# Patient Record
Sex: Female | Born: 1940 | Race: White | Hispanic: No | Marital: Single | Smoking: Former smoker
Health system: Southern US, Community
[De-identification: ages and names within clinical notes are randomized; demographics above are authoritative.]

## PROBLEM LIST (undated history)

## (undated) DIAGNOSIS — I10 Essential (primary) hypertension: Secondary | ICD-10-CM

## (undated) DIAGNOSIS — J439 Emphysema, unspecified: Secondary | ICD-10-CM

## (undated) DIAGNOSIS — I509 Heart failure, unspecified: Secondary | ICD-10-CM

## (undated) HISTORY — PX: HIP SURGERY: SHX245

## (undated) HISTORY — PX: TUMOR REMOVAL: SHX12

---

## 2004-09-12 ENCOUNTER — Ambulatory Visit: Payer: Self-pay | Admitting: Internal Medicine

## 2004-09-14 ENCOUNTER — Ambulatory Visit: Payer: Self-pay | Admitting: *Deleted

## 2004-09-26 ENCOUNTER — Ambulatory Visit (HOSPITAL_COMMUNITY): Admission: RE | Admit: 2004-09-26 | Discharge: 2004-09-27 | Payer: Self-pay | Admitting: *Deleted

## 2012-05-04 DIAGNOSIS — J449 Chronic obstructive pulmonary disease, unspecified: Secondary | ICD-10-CM | POA: Insufficient documentation

## 2012-05-04 DIAGNOSIS — I10 Essential (primary) hypertension: Secondary | ICD-10-CM | POA: Insufficient documentation

## 2012-07-10 ENCOUNTER — Inpatient Hospital Stay: Payer: Self-pay | Admitting: Internal Medicine

## 2012-07-10 LAB — COMPREHENSIVE METABOLIC PANEL
Albumin: 3.9 g/dL (ref 3.4–5.0)
Alkaline Phosphatase: 136 U/L (ref 50–136)
Anion Gap: 11 (ref 7–16)
BUN: 40 mg/dL — ABNORMAL HIGH (ref 7–18)
Bilirubin,Total: 0.4 mg/dL (ref 0.2–1.0)
Chloride: 95 mmol/L — ABNORMAL LOW (ref 98–107)
Co2: 24 mmol/L (ref 21–32)
Creatinine: 1.51 mg/dL — ABNORMAL HIGH (ref 0.60–1.30)
EGFR (Non-African Amer.): 34 — ABNORMAL LOW
Glucose: 91 mg/dL (ref 65–99)
Osmolality: 270 (ref 275–301)
SGOT(AST): 32 U/L (ref 15–37)
SGPT (ALT): 16 U/L (ref 12–78)
Sodium: 130 mmol/L — ABNORMAL LOW (ref 136–145)

## 2012-07-10 LAB — CBC WITH DIFFERENTIAL/PLATELET
Basophil #: 0 10*3/uL (ref 0.0–0.1)
Basophil %: 0.5 %
HCT: 42.2 % (ref 35.0–47.0)
HGB: 14.1 g/dL (ref 12.0–16.0)
Lymphocyte #: 0.7 10*3/uL — ABNORMAL LOW (ref 1.0–3.6)
MCHC: 33.4 g/dL (ref 32.0–36.0)
MCV: 93 fL (ref 80–100)
Monocyte %: 4.7 %
Neutrophil #: 8.7 10*3/uL — ABNORMAL HIGH (ref 1.4–6.5)
Neutrophil %: 88.1 %
RBC: 4.52 10*6/uL (ref 3.80–5.20)
RDW: 12.8 % (ref 11.5–14.5)
WBC: 9.9 10*3/uL (ref 3.6–11.0)

## 2012-07-10 LAB — CK TOTAL AND CKMB (NOT AT ARMC)
CK, Total: 126 U/L (ref 21–215)
CK-MB: 0.8 ng/mL (ref 0.5–3.6)

## 2012-07-10 LAB — TROPONIN I: Troponin-I: <0.02 ng/mL

## 2012-07-11 LAB — BASIC METABOLIC PANEL
Anion Gap: 10 (ref 7–16)
BUN: 52 mg/dL — ABNORMAL HIGH (ref 7–18)
Calcium, Total: 8.1 mg/dL — ABNORMAL LOW (ref 8.5–10.1)
Chloride: 99 mmol/L (ref 98–107)
Co2: 22 mmol/L (ref 21–32)
Creatinine: 1.85 mg/dL — ABNORMAL HIGH (ref 0.60–1.30)
EGFR (Non-African Amer.): 27 — ABNORMAL LOW
Glucose: 224 mg/dL — ABNORMAL HIGH (ref 65–99)
Osmolality: 284 (ref 275–301)
Potassium: 4.2 mmol/L (ref 3.5–5.1)

## 2012-07-11 LAB — URINALYSIS, COMPLETE
Bilirubin,UR: NEGATIVE
Blood: NEGATIVE
Glucose,UR: NEGATIVE mg/dL (ref 0–75)
Ketone: NEGATIVE
Protein: NEGATIVE
RBC,UR: <1 /HPF (ref 0–5)
Squamous Epithelial: 1
WBC UR: 4 /HPF (ref 0–5)

## 2012-07-11 LAB — CBC WITH DIFFERENTIAL/PLATELET
Eosinophil #: 0 10*3/uL (ref 0.0–0.7)
Eosinophil %: 0 %
MCH: 30.9 pg (ref 26.0–34.0)
Platelet: 231 10*3/uL (ref 150–440)
WBC: 6 10*3/uL (ref 3.6–11.0)

## 2012-07-11 LAB — PROTIME-INR: INR: 0.8

## 2012-07-11 LAB — TROPONIN I: Troponin-I: <0.02 ng/mL

## 2012-07-12 DIAGNOSIS — R0602 Shortness of breath: Secondary | ICD-10-CM

## 2012-07-12 LAB — CBC WITH DIFFERENTIAL/PLATELET
Basophil %: 0.1 %
Eosinophil #: 0 10*3/uL (ref 0.0–0.7)
HCT: 35.5 % (ref 35.0–47.0)
MCH: 30.3 pg (ref 26.0–34.0)
MCHC: 32 g/dL (ref 32.0–36.0)
Monocyte #: 0.3 x10 3/mm (ref 0.2–0.9)
Monocyte %: 2.3 %
Neutrophil #: 11.4 10*3/uL — ABNORMAL HIGH (ref 1.4–6.5)
Neutrophil %: 93.6 %
RBC: 3.74 10*6/uL — ABNORMAL LOW (ref 3.80–5.20)
RDW: 12.9 % (ref 11.5–14.5)

## 2012-07-12 LAB — BASIC METABOLIC PANEL
BUN: 50 mg/dL — ABNORMAL HIGH (ref 7–18)
Calcium, Total: 8 mg/dL — ABNORMAL LOW (ref 8.5–10.1)
Creatinine: 1.26 mg/dL (ref 0.60–1.30)
Glucose: 144 mg/dL — ABNORMAL HIGH (ref 65–99)
Osmolality: 282 (ref 275–301)
Potassium: 4.4 mmol/L (ref 3.5–5.1)
Sodium: 133 mmol/L — ABNORMAL LOW (ref 136–145)

## 2012-07-13 LAB — BASIC METABOLIC PANEL
Anion Gap: 9 (ref 7–16)
BUN: 40 mg/dL — ABNORMAL HIGH (ref 7–18)
Calcium, Total: 8.2 mg/dL — ABNORMAL LOW (ref 8.5–10.1)
Chloride: 107 mmol/L (ref 98–107)
Co2: 20 mmol/L — ABNORMAL LOW (ref 21–32)
Creatinine: 1.02 mg/dL (ref 0.60–1.30)
EGFR (African American): >60
EGFR (Non-African Amer.): 55 — ABNORMAL LOW
Osmolality: 284 (ref 275–301)
Potassium: 4.5 mmol/L (ref 3.5–5.1)
Sodium: 136 mmol/L (ref 136–145)

## 2012-08-01 ENCOUNTER — Inpatient Hospital Stay: Payer: Self-pay | Admitting: Specialist

## 2012-08-01 LAB — COMPREHENSIVE METABOLIC PANEL
Albumin: 3.9 g/dL (ref 3.4–5.0)
Alkaline Phosphatase: 133 U/L (ref 50–136)
Anion Gap: 9 (ref 7–16)
BUN: 34 mg/dL — ABNORMAL HIGH (ref 7–18)
Bilirubin,Total: 0.3 mg/dL (ref 0.2–1.0)
Calcium, Total: 8.8 mg/dL (ref 8.5–10.1)
Co2: 27 mmol/L (ref 21–32)
Creatinine: 1.26 mg/dL (ref 0.60–1.30)
Glucose: 97 mg/dL (ref 65–99)
Osmolality: 257 (ref 275–301)
SGOT(AST): 24 U/L (ref 15–37)
SGPT (ALT): 15 U/L (ref 12–78)
Sodium: 124 mmol/L — ABNORMAL LOW (ref 136–145)
Total Protein: 8.1 g/dL (ref 6.4–8.2)

## 2012-08-01 LAB — CBC
MCH: 31.4 pg (ref 26.0–34.0)
MCHC: 34.1 g/dL (ref 32.0–36.0)
MCV: 92 fL (ref 80–100)
RBC: 3.87 10*6/uL (ref 3.80–5.20)
WBC: 5.9 10*3/uL (ref 3.6–11.0)

## 2012-08-01 LAB — BASIC METABOLIC PANEL
BUN: 30 mg/dL — ABNORMAL HIGH (ref 7–18)
Chloride: 93 mmol/L — ABNORMAL LOW (ref 98–107)
Co2: 26 mmol/L (ref 21–32)
Creatinine: 1.13 mg/dL (ref 0.60–1.30)
EGFR (African American): 56 — ABNORMAL LOW
EGFR (Non-African Amer.): 49 — ABNORMAL LOW
Glucose: 101 mg/dL — ABNORMAL HIGH (ref 65–99)
Osmolality: 260 (ref 275–301)
Potassium: 3.9 mmol/L (ref 3.5–5.1)
Sodium: 126 mmol/L — ABNORMAL LOW (ref 136–145)

## 2012-08-01 LAB — URINALYSIS, COMPLETE
Bilirubin,UR: NEGATIVE
Glucose,UR: NEGATIVE mg/dL (ref 0–75)
Ph: 6 (ref 4.5–8.0)
Specific Gravity: 1.006 (ref 1.003–1.030)
Squamous Epithelial: <1

## 2012-08-01 LAB — TROPONIN I: Troponin-I: <0.02 ng/mL

## 2012-08-02 LAB — CBC WITH DIFFERENTIAL/PLATELET
Basophil #: 0 10*3/uL (ref 0.0–0.1)
Basophil %: 0.4 %
Eosinophil #: 0 10*3/uL (ref 0.0–0.7)
Lymphocyte #: 0.7 10*3/uL — ABNORMAL LOW (ref 1.0–3.6)
Lymphocyte %: 14.5 %
MCH: 30.5 pg (ref 26.0–34.0)
MCHC: 33.2 g/dL (ref 32.0–36.0)
MCV: 92 fL (ref 80–100)
Monocyte #: 0.4 x10 3/mm (ref 0.2–0.9)
Neutrophil %: 76.3 %
Platelet: 329 10*3/uL (ref 150–440)
RBC: 3.71 10*6/uL — ABNORMAL LOW (ref 3.80–5.20)
WBC: 4.7 10*3/uL (ref 3.6–11.0)

## 2012-08-02 LAB — BASIC METABOLIC PANEL
Chloride: 96 mmol/L — ABNORMAL LOW (ref 98–107)
Co2: 26 mmol/L (ref 21–32)
Creatinine: 0.98 mg/dL (ref 0.60–1.30)
EGFR (African American): >60
EGFR (Non-African Amer.): 58 — ABNORMAL LOW
Osmolality: 266 (ref 275–301)
Potassium: 3.9 mmol/L (ref 3.5–5.1)
Sodium: 130 mmol/L — ABNORMAL LOW (ref 136–145)

## 2012-08-04 LAB — CBC WITH DIFFERENTIAL/PLATELET
Basophil #: 0 10*3/uL (ref 0.0–0.1)
Basophil %: 0.1 %
Eosinophil #: 0 10*3/uL (ref 0.0–0.7)
Eosinophil %: 0 %
HGB: 10.1 g/dL — ABNORMAL LOW (ref 12.0–16.0)
Lymphocyte #: 0.3 10*3/uL — ABNORMAL LOW (ref 1.0–3.6)
Lymphocyte %: 6.7 %
MCH: 30.9 pg (ref 26.0–34.0)
MCV: 93 fL (ref 80–100)
Monocyte %: 0.7 %
Neutrophil #: 4.1 10*3/uL (ref 1.4–6.5)
Neutrophil %: 92.5 %
Platelet: 314 10*3/uL (ref 150–440)
RBC: 3.29 10*6/uL — ABNORMAL LOW (ref 3.80–5.20)

## 2012-08-04 LAB — BASIC METABOLIC PANEL
Calcium, Total: 8.5 mg/dL (ref 8.5–10.1)
Co2: 26 mmol/L (ref 21–32)
Creatinine: 0.84 mg/dL (ref 0.60–1.30)
EGFR (African American): >60
EGFR (Non-African Amer.): >60
Glucose: 162 mg/dL — ABNORMAL HIGH (ref 65–99)
Potassium: 5 mmol/L (ref 3.5–5.1)
Sodium: 132 mmol/L — ABNORMAL LOW (ref 136–145)

## 2013-10-19 ENCOUNTER — Emergency Department: Payer: Self-pay | Admitting: Emergency Medicine

## 2013-10-28 ENCOUNTER — Encounter: Payer: Self-pay | Admitting: Surgery

## 2013-11-02 ENCOUNTER — Inpatient Hospital Stay: Payer: Self-pay | Admitting: Internal Medicine

## 2013-11-02 LAB — BASIC METABOLIC PANEL
Anion Gap: 12 (ref 7–16)
BUN: 24 mg/dL — ABNORMAL HIGH (ref 7–18)
CREATININE: 1 mg/dL (ref 0.60–1.30)
Calcium, Total: 9.5 mg/dL (ref 8.5–10.1)
Chloride: 82 mmol/L — ABNORMAL LOW (ref 98–107)
Co2: 24 mmol/L (ref 21–32)
EGFR (Non-African Amer.): 56 — ABNORMAL LOW
Glucose: 94 mg/dL (ref 65–99)
Osmolality: 242 (ref 275–301)
Potassium: 4.2 mmol/L (ref 3.5–5.1)
SODIUM: 118 mmol/L — AB (ref 136–145)

## 2013-11-02 LAB — URINALYSIS, COMPLETE
BACTERIA: NONE SEEN
BILIRUBIN, UR: NEGATIVE
BLOOD: NEGATIVE
GLUCOSE, UR: NEGATIVE mg/dL (ref 0–75)
LEUKOCYTE ESTERASE: NEGATIVE
Nitrite: NEGATIVE
PH: 6 (ref 4.5–8.0)
Protein: NEGATIVE
SPECIFIC GRAVITY: 1.005 (ref 1.003–1.030)

## 2013-11-02 LAB — CBC WITH DIFFERENTIAL/PLATELET
BASOS ABS: 0 10*3/uL (ref 0.0–0.1)
Basophil %: 0.4 %
EOS ABS: 0 10*3/uL (ref 0.0–0.7)
EOS PCT: 0.1 %
HCT: 37.8 % (ref 35.0–47.0)
HGB: 12.5 g/dL (ref 12.0–16.0)
Lymphocyte #: 0.7 10*3/uL — ABNORMAL LOW (ref 1.0–3.6)
Lymphocyte %: 9.8 %
MCH: 29.8 pg (ref 26.0–34.0)
MCHC: 32.9 g/dL (ref 32.0–36.0)
MCV: 91 fL (ref 80–100)
MONO ABS: 0.3 x10 3/mm (ref 0.2–0.9)
Monocyte %: 4.9 %
NEUTROS PCT: 84.8 %
Neutrophil #: 5.9 10*3/uL (ref 1.4–6.5)
PLATELETS: 455 10*3/uL — AB (ref 150–440)
RBC: 4.18 10*6/uL (ref 3.80–5.20)
RDW: 12.8 % (ref 11.5–14.5)
WBC: 7 10*3/uL (ref 3.6–11.0)

## 2013-11-02 LAB — TROPONIN I: Troponin-I: <0.02 ng/mL

## 2013-11-02 LAB — TSH: THYROID STIMULATING HORM: 1.29 u[IU]/mL

## 2013-11-03 LAB — BASIC METABOLIC PANEL
Anion Gap: 6 — ABNORMAL LOW (ref 7–16)
BUN: 24 mg/dL — ABNORMAL HIGH (ref 7–18)
CHLORIDE: 85 mmol/L — AB (ref 98–107)
CO2: 31 mmol/L (ref 21–32)
Calcium, Total: 9 mg/dL (ref 8.5–10.1)
Creatinine: 0.98 mg/dL (ref 0.60–1.30)
EGFR (African American): >60
EGFR (Non-African Amer.): 57 — ABNORMAL LOW
Glucose: 92 mg/dL (ref 65–99)
OSMOLALITY: 250 (ref 275–301)
Potassium: 3.7 mmol/L (ref 3.5–5.1)
Sodium: 122 mmol/L — ABNORMAL LOW (ref 136–145)

## 2013-11-03 LAB — OSMOLALITY, URINE: Osmolality: 210 mOsm/kg

## 2013-11-03 LAB — SODIUM, URINE, RANDOM: Sodium, Urine Random: 37 mmol/L (ref 20–110)

## 2013-11-04 LAB — BASIC METABOLIC PANEL
Anion Gap: 5 — ABNORMAL LOW (ref 7–16)
BUN: 17 mg/dL (ref 7–18)
Calcium, Total: 9.2 mg/dL (ref 8.5–10.1)
Chloride: 97 mmol/L — ABNORMAL LOW (ref 98–107)
Co2: 29 mmol/L (ref 21–32)
Creatinine: 0.82 mg/dL (ref 0.60–1.30)
EGFR (African American): >60
EGFR (Non-African Amer.): >60
Glucose: 109 mg/dL — ABNORMAL HIGH (ref 65–99)
Osmolality: 265 (ref 275–301)
POTASSIUM: 4.2 mmol/L (ref 3.5–5.1)
Sodium: 131 mmol/L — ABNORMAL LOW (ref 136–145)

## 2013-11-04 LAB — CBC WITH DIFFERENTIAL/PLATELET
BASOS ABS: 0 10*3/uL (ref 0.0–0.1)
Basophil %: 0.6 %
Eosinophil #: 0.1 10*3/uL (ref 0.0–0.7)
Eosinophil %: 1.2 %
HCT: 33.1 % — AB (ref 35.0–47.0)
HGB: 10.8 g/dL — AB (ref 12.0–16.0)
Lymphocyte #: 0.9 10*3/uL — ABNORMAL LOW (ref 1.0–3.6)
Lymphocyte %: 21.1 %
MCH: 30.1 pg (ref 26.0–34.0)
MCHC: 32.6 g/dL (ref 32.0–36.0)
MCV: 93 fL (ref 80–100)
Monocyte #: 0.6 x10 3/mm (ref 0.2–0.9)
Monocyte %: 14.2 %
NEUTROS ABS: 2.7 10*3/uL (ref 1.4–6.5)
Neutrophil %: 62.9 %
Platelet: 393 10*3/uL (ref 150–440)
RBC: 3.58 10*6/uL — ABNORMAL LOW (ref 3.80–5.20)
RDW: 13.1 % (ref 11.5–14.5)
WBC: 4.3 10*3/uL (ref 3.6–11.0)

## 2013-11-05 LAB — BASIC METABOLIC PANEL
Anion Gap: 3 — ABNORMAL LOW (ref 7–16)
BUN: 11 mg/dL (ref 7–18)
CALCIUM: 9.2 mg/dL (ref 8.5–10.1)
Chloride: 103 mmol/L (ref 98–107)
Co2: 28 mmol/L (ref 21–32)
Creatinine: 0.79 mg/dL (ref 0.60–1.30)
EGFR (African American): >60
EGFR (Non-African Amer.): >60
GLUCOSE: 108 mg/dL — AB (ref 65–99)
Osmolality: 268 (ref 275–301)
Potassium: 4.7 mmol/L (ref 3.5–5.1)
SODIUM: 134 mmol/L — AB (ref 136–145)

## 2013-11-24 ENCOUNTER — Encounter: Payer: Self-pay | Admitting: Surgery

## 2014-02-03 DIAGNOSIS — M1610 Unilateral primary osteoarthritis, unspecified hip: Secondary | ICD-10-CM | POA: Insufficient documentation

## 2014-09-16 NOTE — H&P (Signed)
Past Med/Surgical Hx:  HTN:   COPD:   ALLERGIES:  Prednisone: Unknown  Codeine: Unknown  Penicillin: Unknown   Assessment/Admission Diagnosis 74 yo female with PMHx of HTN, COPD, Recent admission for PNA, now with possible intersitial PNA vs HCAP  1. Interstitial PNA vs HCAP - will treat as an HCAP with vacn and zosyn - her history of recurrent abx and steroids for the past 3 months with relapse of sob, dizziness (probably from hypoxia), and sputum production fits more of an interstitial or organizing pneumonia - would recommend pulmonary consult for possible bronchoscopy evaluation - may need to consider high dose steriods based on pulmonary recommendations - check sputum\respiratory culture - supplemental O2 to maintain sats 88-92%  2. COPD  - continue with advair, spiriva and prn albuterol - currently not in exacerbation - check ABG - check 6MWT when close to discharge   3.Hyponatremia - secondary to poor po appeptite - continue with IVFs  4. Hx of HTN - currently normotensive - patient with noted wide pulse, could be due to atherosclerosis or thyroid problems, will check TSH and T4  5. Lung Nodule - 7.11mm - seen on CT, LLL - can be followed up as an outpatient with PET CT in 4-6 wks after discharge.   FULL CODE PMD - Dr.Jack Wolfe  Time spent evaluating patient = 55 minutes Job# [id number]   Electronic Signatures: Vilinda Boehringer (MD)  (Signed 08-Mar-14 23:17)  Authored: PAST MEDICAL/SURGIAL HISTORY, ALLERGIES, ASSESSMENT AND PLAN   Last Updated: 08-Mar-14 23:17 by Vilinda Boehringer (MD)

## 2014-09-16 NOTE — H&P (Signed)
PATIENT NAME:  Jacqueline Boyd, Jacqueline Boyd MR#:  [id number] DATE OF BIRTH:  [birth year]  DATE OF ADMISSION:  07/10/2012  PRIMARY CARE PHYSICIAN: Dory Horn. Eliberto Ivory, MD  REQUESTING PHYSICIAN: Shirley Friar. Braud, MD  CHIEF COMPLAINT: Shortness of breath.   HISTORY OF PRESENT ILLNESS: The patient is a 74 year old female with a known history of COPD and hypertension, is being admitted for acute on chronic respiratory failure likely due to COPD exacerbation and/or pneumonia. The patient started having chest congestion and coughing since Tuesday. Had very poor p.o. intake for the last 2 to 3 days. Has been feeling really sick. Denies any chest pain. Her cough is dry in nature. She was feeling very dizzy and decided to come to the Emergency Department as she was not getting any better. While in the ED, she was found to have acute renal failure and possible COPD flare. She was quite hypoxic with room air oxygen saturation of 61%. She was placed on BiPAP and is being admitted for further evaluation and management.   PAST MEDICAL HISTORY:  1.  COPD.  2.  Hypertension.   MEDICATIONS AT HOME:   1.  ProAir 2 puffs inhaled 4 times a day.  2.  Lisinopril 20 mg p.o. daily.  3.  Lasix 40 mg p.o. b.i.d.  4.  Atenolol 50 mg p.o. daily.  5.  Amlodipine 10 mg p.o. daily.   ALLERGIES: CODEINE, PENICILLIN, PREDNISONE.   SOCIAL HISTORY: No smoking. Occasional alcohol, mainly beer. She lives alone.   FAMILY HISTORY: Mother had hypertension.   REVIEW OF SYSTEMS:   CONSTITUTIONAL: No fever. Positive fatigue and weakness.  EYES: No blurred or double vision.  ENT: No tinnitus or ear pain.  RESPIRATORY: Positive for dry cough, wheezing. Also shortness of breath along with chest congestion.  CARDIOVASCULAR: No chest pain, orthopnea, edema.  GASTROINTESTINAL: No nausea, vomiting, diarrhea. Poor p.o. intake.  GENITOURINARY: No dysuria or hematuria. ENDOCRINE: No polyuria or nocturia. HEMATOLOGIC: No anemia or easy bruising.   SKIN: No rash or lesion.  MUSCULOSKELETAL: No arthritis or muscle cramps.  NEUROLOGIC: No tingling, numbness, weakness. Positive for dizziness. PSYCHIATRIC: No history of anxiety or depression.   PHYSICAL EXAMINATION:  VITAL SIGNS: Temperature 98.1, heart rate 82 per minute, respirations 22 per minute, blood pressure 75/44 mmHg. She was saturating 61% on room air and now she is placed on BiPAP and saturating about 96% to 98% on that.  GENERAL: The patient is a 74 year old female lying in the bed in acute respiratory distress.  EYES: Pupils equal, round, reactive to light and accommodation. No scleral icterus. Extraocular muscles intact.  HENT: Head atraumatic, normocephalic. Oropharynx and nasopharynx clear.  NECK: Supple. No jugular venous distention. No thyroid enlargement or tenderness.  LUNGS: Decreased breath sounds present bilaterally, wheezing throughout both lungs. Rhonchi present in the right lower lobe. No rales or crepitation. Accessory muscles of respiration in use. BiPAP applied.  CARDIOVASCULAR: S1, S2 normal. No murmur, rubs or gallop.  ABDOMEN: Soft, nontender, nondistended. Bowel sounds present. No organomegaly or mass. EXTREMITIES: No pedal edema, cyanosis or clubbing. She does have chronic depigmentation changes in both lower extremities.  NEUROLOGIC: Nonfocal examination. Cranial nerves II through XII intact. Muscle strength 5/5 in all extremities. Sensation intact. PSYCHIATRIC: The patient is alert, oriented to time, place and person x 3.  SKIN: No obvious rash, lesion or ulcer.  LABORATORY, DIAGNOSTIC AND RADIOLOGICAL DATA: Normal CBC. Normal first set of cardiac enzymes. Normal liver function tests. BMP showed sodium 130, potassium 4.2, chloride  95, CO2 of 24, BUN 40, creatinine 1.51, blood glucose 91. ABG showed pH of 7.28, pCO2 of 44, pO2 of 77, bicarbonate 21.7.   Chest x-ray while in the ED showed possible right lower lobe infiltrate with small right pleural effusion  and COPD changes.   EKG shows sinus rhythm with frequent PVCs, left axis deviation and left bundle branch block. No old EKG available for comparison.   IMPRESSION AND PLAN:  1.  Acute hypoxic respiratory failure, likely due to chronic obstructive pulmonary disease exacerbation with possible right lower lobe pneumonia. Will start her on IV antibiotics, steroids, nebulizer treatment, Flovent and Spiriva. She is on BiPAP now and will monitor closely in the Critical Care Unit stepdown unit at this time, as her breathing seems bad. 2.  Chronic obstructive pulmonary disease exacerbation: Management as above. Will consider pulmonary consult if not improved.  3.  Pneumonia: IV Levaquin will be started. Obtain blood and sputum culture.  4.  Acute renal failure, likely prerenal in nature: Will hold angiotensin-converting enzyme inhibitor along with Lasix. Will hydrate her aggressively with IV fluids and recheck her renal function. This is likely prerenal in nature.  5.  Hypernatremia, likely due to dehydration with poor oral intake: Will hydrate her aggressively with IV fluids.  6.  Hypotension with a history of hypertension, likely due to dehydration: Will hold her blood pressure medication and hydrate her with aggressive IV fluids and monitor her blood pressure closely while in the Critical Care Unit stepdown.   CODE STATUS: Full code.   TOTAL TIME TAKING CARE OF THIS PATIENT (CRITICAL CARE): 55 minutes.  ____________________________ Lucina Mellow. Manuella Ghazi, MD vss:jm D: 07/10/2012 17:29:37 ET T: 07/10/2012 22:47:31 ET JOB#: [id number]  cc: Khalon Cansler S. Manuella Ghazi, MD, <Dictator> Dory Horn. Eliberto Ivory, [place] MD ELECTRONICALLY SIGNED 07/13/2012 19:28

## 2014-09-16 NOTE — Discharge Summary (Signed)
PATIENT NAME:  Jacqueline Boyd, Jacqueline Boyd MR#:  [id number] DATE OF BIRTH:  06/05/1940  DATE OF ADMISSION:  08/01/2012 DATE OF DISCHARGE:  08/06/2012  HISTORY AND PHYSICAL: For a detailed note, please take a look at the history and physical done on admission by Dr. Stevenson Clinch.   DIAGNOSES AT DISCHARGE: 1. Acute on chronic respiratory failure secondary to chronic obstructive pulmonary disease exacerbation.  2. Chronic obstructive pulmonary disease exacerbation secondary to multifocal pneumonia.  3. Multifocal pneumonia.  4. Hypertension.  5. Anxiety.   DIET: The patient is being discharged on a low-sodium, diet.   ACTIVITY: As tolerated.   FOLLOWUP: With Dr. Calla Kicks in the next 1 to 2 weeks, also with Dr. Devona Konig from pulmonary in the next 1 to 2 weeks.  DISCHARGE MEDICATIONS: Albuterol inhaler 2 puffs q.i.d. as needed, Xanax 0.5 mg q. 8 hours as needed, Spiriva 1 puff daily, fluticasone 1 puff b.i.d., multivitamin daily, Advair 250/50 1 puff b.i.d., amlodipine 10 mg daily, lisinopril 20 mg daily, Lasix 40 mg b.i.d., Levaquin 750 mg q. 48 hours x 7 doses and dexamethasone taper starting at 10 mg b.i.d. down to 2 mg b.i.d. over the next 10 days.   CONSULTANTS DURING HOSPITAL COURSE: Dr. Mortimer Fries from Pulmonary Critical Care.   PERTINENT HOSPITAL DIAGNOSTIC AND RADIOLOGICAL DATA:    A CT scan of the chest done without contrast on admission showing right middle lobe and right lower lobe infiltrate versus atelectasis. The appearance is most suggestive of infiltrate in the right lower lobe. Multifocal pneumonia is not excluded.   HOSPITAL COURSE: This is a 74 year old female with medical problems as mentioned above, presented to the hospital on 08/01/2012 secondary to shortness of breath and likely acute on chronic respiratory failure secondary to COPD exacerbation.   1. Acute on chronic respiratory failure: This was likely secondary to COPD exacerbation. The patient was treated aggressively with IV  steroids, around-the-clock nebulizer treatments, continued on her Advair and her Spiriva. Her clinical symptoms since admission have significantly improved. She was also placed on broad-spectrum IV antibiotics with vancomycin and Zosyn, although her blood cultures have remained negative. She was ambulated, did not qualify for home oxygen, and is currently being discharged on a Decadron taper along with empiric Levaquin, as stated.  2. Chronic obstructive pulmonary disease exacerbation: This was likely secondary to multifocal pneumonia. Again, the patient was treated aggressively with IV steroids, around-the-clock nebulizer treatments, continued on Advair, Spiriva. She was ambulated on room air, did not qualify for home oxygen, therefore, currently is being discharged on her maintenance meds along with a Decadron taper and Levaquin, as stated.  3. 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 It has improved with IV fluids and now resolved.  4. Hypertension: Initially, the patient was hypotensive; therefore, her antihypertensives were held, although she will resume her amlodipine and her lisinopril upon discharge.  5. Anxiety: The patient was maintained on her Xanax.  She will also resume that upon discharge.   CODE STATUS: The patient is a FULL CODE.   TIME SPENT: 40 minutes.    ____________________________ Belia Heman. Verdell Carmine, MD vjs:cb D: 08/06/2012 15:28:10 ET T: 08/06/2012 16:08:08 ET JOB#: [id number]  cc: Belia Heman. Verdell Carmine, MD, <Dictator> Dory Horn. Eliberto Ivory, MD Allyne Gee, MD Henreitta Leber MD ELECTRONICALLY SIGNED 08/10/2012 8:14

## 2014-09-16 NOTE — H&P (Signed)
PATIENT NAME:  Jacqueline Boyd, Jacqueline Boyd MR#:  [id number] DATE OF BIRTH:  06-Oct-1940  DATE OF ADMISSION:  08/01/2012  PRIMARY CARE PHYSICIAN:  Dr. Calla Kicks   EMERGENCY DEPARTMENT REFERRING PHYSICIAN:  Dr. Jimmye Norman   CHIEF COMPLAINT:  "I feel sick."   HISTORY OF PRESENT ILLNESS:  This is a 74 year old Caucasian female with past medical history of COPD, hypertension, recent admission for pneumonia that is being seen for continued right middle lobe, lower lobe infiltrate on CAT scan with continued dizziness and questionable hypoxia with exertion.  The history is per the patient.  She stated that she was here, admitted to [facility] about 2-1/2 weeks ago.  She got treated with steroids, antibiotics.  She was placed on BiPAP briefly for the COPD exacerbation and pneumonia while she was inpatient. Per records, she was discharged on dexamethasone taper and Levaquin that she was supposed to complete on the 4 days of, which she did.  The patient stated since discharge she really has not felt that much better.  She can walk about 15 feet before she starts feeling dizzy.  No real chest tightness or shortness of breath associated with it. Still has a cough, but not bring up much sputum.  She has had decreased by mouth appetite, not really eating.  She has been feeling more lethargic, tired and fatigued in addition to this dizziness with walking that she is getting.  The patient states she does not feel like the room is spinning or she is losing balance from under her feet.  This is something new for her and she cannot explain what it is.  In the ED she was noted to have hyponatremia.  She had some mild desats requiring 2 liters of oxygen.  She is sating down to about 87% to 88%, and given that she is still not having any clinical improvement since discharge with her persistent infiltrate on chest x-ray and CT scan, she is being evaluated by hospitalist services for further inpatient work-up and  management.    CURRENT MEDICATIONS:  Are as follows:  ProAir 2 puffs inhaled 4 times a day as needed, alprazolam 0.5 mg 1 tab every 8 hours for anxiety, ipratropium 18 mcg inhaled capsule once a day, Levaquin 250 mg 1 tab every 24 hours for 4 days, fluticasone inhalation aerosol 1 puff twice daily, multivitamin 1 tab orally once a day, Advair Diskus 1 puff twice daily, dexamethasone tablets taper which she completed.  She did not require any oxygen on discharge.   ALLERGIES:  CODEINE, PENICILLIN AND PREDNISONE.  SHE STATES THAT WITH PENICILLIN SHE GETS A MILD RASH.  NO HIVES.  WITH PREDNISONE PATIENT STATES THAT SHE STARTS HAVING SHIVERING OR SHAKING OR INCREASED ANXIETY.   SOCIAL HISTORY:  Currently not a smoker; previously smoked for about 25 to 30 years, a pack a day.  Quit smoking about 15 to 20 years ago.   FAMILY HISTORY:  Mother has hypertension.  She lives alone.   REVIEW OF SYSTEMS: CONSTITUTIONAL:  No fever.  Positive fatigue and weakness and dizziness.  EYES:  No blurred vision, double vision.  EARS, NOSE, THROAT:  No tinnitus or ear pain or hearing loss.  RESPIRATORY:  Positive for dry cough, intermittent wheezes.  Also some subjective shortness of breath with exertion along with some chest congestion.  CARDIOVASCULAR:  No chest pain, no chest tightness, no chest pressure.  No orthopnea.  No edema.  GASTROINTESTINAL:  No nausea, vomiting, diarrhea.  Admits to poor  by mouth intake over the last 2 to 3 weeks.  GENITOURINARY:  No dysuria, hematuria.  ENDOCRINE:  No polyuria, nocturia.  HEMATOLOGIC:  No anemia, easy bleeding, bruising or swollen glands.  SKIN:  No rash, lesions or erythema.  Skin is warm and dry.  MUSCULOSKELETAL:  No arthritis, muscle cramps.  NEUROLOGICAL:  No tingling, numbness or weakness.  Positive for dizziness.  PSYCHIATRIC:  No schizophrenia, depression, OCD, bipolar or ADHD.  Admits to anxiety.   PHYSICAL EXAMINATION:  VITAL SIGNS:  In the ED are as  follows:  Temperature 97.5, pulse of 71, respirations are 18, saturating 95% on 2 liters, blood pressure 131/56.  HEENT:  Pupils are equal, round and reactive to light and accommodation.  Extraocular movements are intact.  No scleral icterus.  Denies difficulty hearing.  Dry mucosa is noted.  NECK:  No thyroid enlargement.  No nodules.  Neck is supple and nontender with no masses.  No adenopathy is noted.  No JVD or carotid bruits.  Full range of motion is noted.  RESPIRATORY:  Coarse upper airway sounds. Decreased breath sounds bilaterally. Some faint expiratory wheezes are noted at the bilateral bases.  Otherwise, no labored breathing, no increased effort, no respiratory distress.  CARDIOVASCULAR:  Regular rate, regular rhythm.  No murmurs.  S1, S2 auscultated.  PMI is not lateralized and there is no lower extremity edema.  ABDOMEN:  Soft, nontender, nondistended.  Positive bowel sounds.  MUSCULOSKELETAL: 5 out of 5 strength bilateral upper and lower extremities.  No cyanosis, DJD or kyphosis.  SKIN:  No rash, lesions, erythema, nodules, induration.  Skin is warm and dry.  LYMPHATIC:  No adenopathy noted in the cervical, axilla or supraclavicular regions.  NEUROLOGIC:  Cranial nerves II through XII intact.  Deep tendon reflexes intact.  Sensory is intact.  PSYCHIATRIC:  Alert and oriented to time and place, cooperative and good judgment is noted.   LABORATORY AND DIAGNOSTIC DATA:  She had an EKG that showed normal sinus rhythm, rate of about 70.  No acute ST-T wave changes.  She had a CT scan of the chest without contrast done that showed no acute CT abnormality of the chest, abdomen and pelvis.  Findings suggestive of an old right rib fracture, recorded clinically. There is a rounded noncalcified nodular superior segment of the left lower lobe which must be followed to document stability.  If the patient is at high risk by prominent smoking history, then further assessment for developing a malignancy  should be performed with a PET CT.  There is also a smoothing marginated soft tissue nodular density in the left lower lobe with a diameter of approximately 7.3 mm as previously stated above.  There is dependent atelectasis.  Also, there is some minimal density in the lingula and right middle lobe which may reflect a fibrotic area.  This is most prominent in the lingula with a width of 1.7 cm.  Fibrosis could give a similar appearance of atelectasis; otherwise, no other acute chest findings are noted.  Pertinent labs are as follows.  Her sodium is 126, potassium is 3.9, chloride is 93, bicarb was 26, BUN is 30, creatinine is 1.13, glucose was 101.  UA does not show any acute signs of infection.  She did have a CBC that showed a WBC of 5.9, H and H of 12.2 and 37.5 and a platelet of 380.   ASSESSMENT AND PLAN:  A 74 year old female with a past medical history of hypertension, chronic obstructive pulmonary  disease, recent admission for pneumonia, now with possible interstitial pneumonia versus healthcare-associated pneumonia. 1.  Interstitial pneumonia versus healthcare-associated pneumonia.  At this time, we will treat as a healthcare-associated pneumonia with vancomycin and Zosyn and a history of recurrent antibiotic, steroid use over the past 3 months with relapse of shortness of breath and dizziness, intermittent at times.  The dizziness could reflect hypoxia.  She does have intermittent sputum production and the combination of these three things kind of more likely fits an organizing pneumonia interstitial type of process.  Would recommend pulmonary consult and possible bronchoscopy evaluation.  May need to consider high dose steroids based on pulmonary recommendations.  Check sputum and respiratory culture.  Continue with supplemental O2 to maintain saturations 88% to 92% at rest and with exertion.  2.  Chronic obstructive pulmonary disease.  Continue with Advair, Spiriva and as needed albuterol.  Currently  not in exacerbation.  Check an ABG.  We will check a 6 minute walk test prior to discharge.  3.  Hyponatremia secondary to poor by mouth intake of both solids and fluids.  Continue with IV fluids right now.  4.  History of hypertension, currently normotensive.  The patient noted with a wide pulse pressure while in the ED.  Could be atherosclerosis or thyroid problems.  We will check a TSH and a T4 also.  5.  Lung nodule 7.3 mm in the left lower lobe.  This can be followed up as an outpatient with a PET scan in 4 to 6 weeks after discharge; however, if patient wants to be aggressive about this, it could be evaluated by pulmonary and see if it is amenable to biopsy.  She does have risk factors being older than 60, positive smoking history and with chronic obstructive pulmonary disease.  She does have high risk factors for malignancy based on these above factors.   CODE STATUS:  THE PATIENT IS A FULL CODE.    TIME SPENT DICTATING AND EVALUATING THE PATIENT:  55 minutes.     ____________________________ Vilinda Boehringer, MD vm:ea D: 08/01/2012 22:27:26 ET T: 08/02/2012 00:44:47 ET JOB#: [id number]  cc: Vilinda Boehringer, MD, <Dictator> Vilinda Boehringer MD ELECTRONICALLY SIGNED 08/03/2012 8:32

## 2014-09-16 NOTE — Discharge Summary (Signed)
PATIENT NAME:  Jacqueline, Boyd MR#:  [id number] DATE OF BIRTH:  Apr 05, 1941  DATE OF ADMISSION:  07/10/2012 DATE OF DISCHARGE:  07/14/2012  PRIMARY CARE PHYSICIAN: Willette Alma, MD  DISCHARGE DIAGNOSES:  1.  Chronic obstructive pulmonary disease exacerbation.  2.  Pneumonia.  3.  Acute respiratory failure. 4.  Acute renal failure.  5.  Hypertension.   HISTORY OF PRESENT ILLNESS: The patient is a 74 year old female with known history of COPD and hypertension who is being admitted for acute on chronic respiratory failure likely due to COPD exacerbation and pneumonia. The patient started having chest congestion and coughing since 3 to 4 days ago. Had very poor p.o. intake for last 2 to 3 days and had been feeling really sick. Denied any chest pain. Cough was dry in nature and was feeling very dizzy and decided to come to the Emergency Department as she was not getting any better. While in the ED, she was found having acute renal failure and COPD flare. She was quite hypoxic with room air oxygen saturation up to 61% and started on BiPAP.   HOSPITAL COURSE AND STAY:  1.  Acute hypoxic respiratory failure likely due to COPD exacerbation, possibly right lower lobe pneumonia. She was started on IV antibiotics, steroids and nebulizer treatment. Flovent and Spiriva were added. She was initially started on BiPAP and monitored in critical care unit.  2. Chronic obstructive pulmonary disease exacerbation. Managed with IV steroids and nebulizers.  She felt better in 1 to 2 days and placed on high flow nasal cannula and transferred to a regular room and then gradually she came down on nasal cannula, 5 to 6 liters, and then was able to tolerate room air on 5th day of hospital stay. 3.  For pneumonia she was treated with IV Levaquin. Sputum and blood cultures were done. She felt significantly better after 5 days of treatment and then she was discharged home.  4.  Hypotension. She was hypotensive in PCP's  office in past, but they never took off the blood pressure medication and she was continued on the same medication. When she came to ER, her blood pressure was running on the lower side.  We gave her IV fluid and blood pressure came up. We held all the blood pressure medications.  5.  Acute renal failure. He was likely due to her pneumonia and respiratory failure and prerenal causes which improved slowly with hydration.  6.  Hyponatremia. This was also thought likely due to dehydration and with IV normal saline it improved.   LAB AND DIAGNOSTIC RESULTS DURING THE HOSPITAL STAY: WBC was 9.9, hemoglobin was 14.1, platelet count was 263 and MCV was 93. BUN was 40 and creatinine was 1.51. Sodium was 130. Potassium was 4.3, chloride 95 and CO2 24. Troponin less than 0.02. Chest X-ray: Minimal right lung base atelectasis or infiltrate with small right pleural effusion. Her renal function worsened the next day. Creatinine went up to 1.85. Urinalysis grossly negative. Renal function improved and at the time of discharge creatinine was 1.02. Echocardiogram showed left ventricular systolic function is normal, ejection fraction more than 55%. Transmitral spectral Doppler flow pattern is suggestive of impaired LV relaxation and concentric left ventricular hypertrophy. The left atrium is mildly dilated. There is stress tricuspid regurgitation. Right ventricular systolic pressure is elevated at 30 to 40.   CONDITION ON DISCHARGE: Stable.   CODE STATUS ON DISCHARGE: FULL CODE.  MEDICATIONS ADVISED ON DISCHARGE:  1.  Pro Air HFA 2  puffs inhaled 4 times a day as needed.  2.  Alprazolam 0.5 mg oral tablet every 8 hours as needed for anxiety.  3.  Tiotropium 18 mcg inhaled capsule once a day. 4.   Levaquin 250 mg oral tablet every 24 hours for 4 days. 5.  Fluticasone inhalation aerosol 1 puff 2 times a day.  6.  Multivitamin 1 tablet orally once a day. 7.  Advair Diskus 1 puff 2 times a day. 8.  Dexamethasone 2  tablets oral once a day for 2 days and then dexamethasone 4 tablets oral once a day for 2 days.   OXYGEN ON DISCHARGE: No.   DIET ON DISCHARGE: Low sodium.   DIET CONSISTENCY: Regular.   ACTIVITY LIMITATION: As tolerated.   TIMEFRAME TO FOLLOWUP: In 1 to 2 weeks with primary care physician, Dr. Willette Alma.  TOTAL TIME SPENT: 45 minutes.  ____________________________ Ceasar Lund Anselm Jungling, MD vgv:[id number] D: 07/16/2012 21:26:10 ET T: 07/17/2012 09:23:16 ET JOB#: [id number]  cc: Ceasar Lund. Anselm Jungling, MD, <Dictator> Willette Alma, MD Vaughan Basta MD ELECTRONICALLY SIGNED 07/17/2012 19:56

## 2014-09-17 NOTE — H&P (Signed)
PATIENT NAME:  Jacqueline Boyd, Jacqueline Boyd MR#:  [id number] DATE OF BIRTH:  Oct 22, 1940  DATE OF ADMISSION:  11/02/2013  REFERRING PHYSICIAN: Thomasene Lot.   PRIMARY CARE PHYSICIAN: [facility] in [place].   CHIEF COMPLAINT: Fatigue, weakness.   HISTORY OF PRESENT ILLNESS: A 74 year old Caucasian female with past medical history of COPD as well as hypertension, presenting with fatigue, weakness. Describes 2 to 3 days' duration of generalized weakness and fatigue with associated poor p.o. intake for the same duration of time. She does state that she has been drinking water. No food. She also denotes having a left lower extremity leg ulcer that has been present for approximately 2 weeks' duration, and she follows actively with the wound clinic for the above finding. She visited her PCP today for concerns of generalized weakness, fatigue and was instructed to present to the hospital for further workup and evaluation. On routine workup, found to have an initial sodium of 118. Currently, she is complaining only of fatigue, weakness as described above. No further complaints. Denies any dysuria, change in urinary frequency or further symptomatology.   REVIEW OF SYSTEMS:   CONSTITUTIONAL: Positive for fatigue, weakness as described above. Denies any fevers or chills.  EYES: Denies blurred vision, double vision, eye pain.  EARS, NOSE, THROAT: Denies tinnitus, ear pain, hearing loss.  RESPIRATORY: Denies cough, wheeze, shortness of breath.  CARDIOVASCULAR: Denies chest pain, palpitations, edema.  GASTROINTESTINAL: Denies nausea, vomiting, diarrhea, abdominal pain.  GENITOURINARY: Denies dysuria or hematuria.  ENDOCRINE: Denies nocturia or thyroid problems.  HEMATOLOGIC AND LYMPHATIC: Positive for bruising of the legs. Denies any bleeding.  SKIN: Positive for left lower extremity skin ulcer that has been present for 2 weeks' duration, without surrounding erythema, edema. There is no drainage at the site.   MUSCULOSKELETAL: Denies pain in neck, back, shoulder, knees, hips or arthritic symptoms.  NEUROLOGIC: Denies paralysis, paresthesias.  PSYCHIATRIC: Denies anxiety or depressive symptoms.   Otherwise, full review of systems negative.   PAST MEDICAL HISTORY: Hypertension, COPD.    SOCIAL HISTORY: Positive for occasional alcohol use. Denies any current tobacco usage.   FAMILY HISTORY: Denies any cardiovascular or pulmonary disorders.   ALLERGIES: CODEINE, PENICILLIN AND PREDNISONE.   HOME MEDICATIONS: Include Norco 325/5 mg p.o. q.6 hours as needed for pain, lisinopril 20 mg p.o. daily, Advair 250/50 mcg 1 puff b.i.d., ProAir 90 mcg inhalation 2 puffs 4 times a day as needed for shortness of breath, tiotropium 18 mcg inhalation 1 capsule daily, Norvasc 10 mg p.o. daily, Lasix 40 mg p.o. b.i.d., multivitamin 1 tablet p.o. daily.   PHYSICAL EXAMINATION:  VITAL SIGNS: Temperature 97.2, heart rate 69, respirations 16, blood pressure 106/49, saturating 100% on room air. Weight 50.8 kg, BMI 17.  GENERAL: Chronically weak, frail-appearing, Caucasian female, currently in no acute distress.  HEAD: Normocephalic, atraumatic.  EYES: Pupils equal, round and reactive to light. Extraocular muscles intact. No scleral icterus.  MOUTH: Moist mucosal membranes. Poor dentition. No abscess noted.  EARS, NOSE, THROAT: Clear without exudates. No external lesions.  NECK: Supple. No thyromegaly. No nodules. No JVD.  PULMONARY: Clear to auscultation bilaterally without wheezes, rubs or rhonchi. No use of accessory muscles. Good respiratory effort.  CHEST: Nontender to palpation.  CARDIOVASCULAR: S1, S2, regular rate and rhythm. No murmurs, rubs or gallops. No edema. Pedal pulses 2+ bilaterally.  GASTROINTESTINAL: Soft, nontender, nondistended. No masses. Positive bowel sounds. No hepatosplenomegaly.  MUSCULOSKELETAL: No swelling, clubbing, edema. Range of motion full in all extremities.  NEUROLOGIC: Cranial  nerves  II through XII intact. No gross focal neurological deficits. Sensation intact. Reflexes intact.  SKIN: There is a left lower extremity ulceration in the medial aspect of the calf without surrounding erythema or drainage. No further lesions, ulcerations, rashes or cyanosis. Skin warm, dry. Turgor intact.  PSYCHIATRIC: Mood and affect within normal limits. The patient is awake, alert, oriented x 3. Insight and judgment intact.   LABORATORY DATA: Sodium 118, potassium 4.2, chloride 82, bicarb 24, BUN 24, creatinine 1, glucose 94. WBC 7, hemoglobin 12.5, platelets of 455.   ASSESSMENT AND PLAN: A 74 year old Caucasian female with history of chronic obstructive pulmonary disease and hypertension, presenting with generalized weakness and fatigue. Found to be hyponatremic.  1. Hyponatremia of 118 for unknown duration. She received 1 liter of normal saline thus far in the Emergency Department. She has a 254 mEq sodium deficit to increase her sodium to 128. Her total sodium deficit is 558.8 mEq of sodium to bring her to 140. Will hold her diuretic, Lasix, as this is the likely contributing factor to her hyponatremia. Will check urine sodium and urine osmolality to help delineate etiology as well as a TSH. Will provide normal saline intravenous fluid hydration with 30 mL an hour and recheck a BMP in the morning.  2. Hypertension: Continue with Norvasc. Hold Lasix.  3. Chronic obstructive pulmonary disease: Continue Advair and Spiriva.   4. Chronic leg wound: Will reconsult wound care for continuation of care.  5. Venous thromboembolism prophylaxis with heparin subcutaneous.   The patient is FULL CODE.   TIME SPENT: 45 minutes.   ____________________________ Aaron Mose. Hower, MD dkh:gb D: 11/02/2013 21:36:13 ET T: 11/02/2013 22:08:40 ET JOB#: [id number]  cc: Aaron Mose. Hower, MD, <Dictator> DAVID Woodfin Ganja MD ELECTRONICALLY SIGNED 11/04/2013 8:15

## 2014-09-17 NOTE — Discharge Summary (Signed)
Dates of Admission and Diagnosis:  Date of Admission 02-Nov-2013   Date of Discharge 05-Nov-2013   Admitting Diagnosis hyponatremia   Final Diagnosis hyponatremia Htn Ch leg wound Generalized weakness    Chief Complaint/History of Present Illness A 74 year old Caucasian female with past medical history of COPD as well as hypertension, presenting with fatigue, weakness. Describes 2 to 3 days' duration of generalized weakness and fatigue with associated poor p.o. intake for the same duration of time. She does state that she has been drinking water. No food. She also denotes having a left lower extremity leg ulcer that has been present for approximately 2 weeks??? duration, and she follows actively with the wound clinic for the above finding. She visited her PCP today for concerns of generalized weakness, fatigue and was instructed to present to the hospital for further workup and evaluation. On routine workup, found to have an initial sodium of 118. Currently, she is complaining only of fatigue, weakness as described above. No further complaints. Denies any dysuria, change in urinary frequency or further symptomatology.   Allergies:  Prednisone: Unknown  Codeine: Unknown  Penicillin: Rash, Itching  Thyroid:  09-Jun-15 18:50   Thyroid Stimulating Hormone 1.29 (0.45-4.50 (International Unit)  ----------------------- Pregnant patients have  different reference  ranges for TSH:  - - - - - - - - - -  Pregnant, first trimetser:  0.36 - 2.50 uIU/mL)  Routine Chem:  09-Jun-15 18:50   Glucose, Serum 94  BUN  24  Creatinine (comp) 1.00  Sodium, Serum  118  Potassium, Serum 4.2  Chloride, Serum  82  CO2, Serum 24  Calcium (Total), Serum 9.5  Anion Gap 12  Osmolality (calc) 242  eGFR (African American) >60  eGFR (Non-African American)  56 (eGFR values <59m/min/1.73 m2 may be an indication of chronic kidney disease (CKD). Calculated eGFR is useful in patients with stable renal  function. The eGFR calculation will not be reliable in acutely ill patients when serum creatinine is changing rapidly. It is not useful in  patients on dialysis. The eGFR calculation may not be applicable to patients at the low and high extremes of body sizes, pregnant women, and vegetarians.)  Result Comment SODIUM - RESULTS VERIFIED BY REPEAT TESTING.  - NOTIFIED OF CRITICAL VALUE  - C JARRELL HILL AT 2002 ON 11/02/13 SAL.  - READ-BACK PROCESS PERFORMED.  Result(s) reported on 02 Nov 2013 at 07:14PM.  10-Jun-15 05:55   Sodium, Serum  122  11-Jun-15 05:57   Sodium, Serum  131  12-Jun-15 03:54   Sodium, Serum  134  Cardiac:  09-Jun-15 18:50   Troponin I < 0.02 (0.00-0.05 0.05 ng/mL or less: NEGATIVE  Repeat testing in 3-6 hrs  if clinically indicated. >0.05 ng/mL: POTENTIAL  MYOCARDIAL INJURY. Repeat  testing in 3-6 hrs if  clinically indicated. NOTE: An increase or decrease  of 30% or more on serial  testing suggests a  clinically important change)  Routine Hem:  09-Jun-15 18:50   WBC (CBC) 7.0  RBC (CBC) 4.18  Hemoglobin (CBC) 12.5  Hematocrit (CBC) 37.8  Platelet Count (CBC)  455  MCV 91  MCH 29.8  MCHC 32.9  RDW 12.8  Neutrophil % 84.8  Lymphocyte % 9.8  Monocyte % 4.9  Eosinophil % 0.1  Basophil % 0.4  Neutrophil # 5.9  Lymphocyte #  0.7  Monocyte # 0.3  Eosinophil # 0.0  Basophil # 0.0 (Result(s) reported on 02 Nov 2013 at 07:14PM.)   Pertinent Past History:  Pertinent  Past History Hypertension, COPD.   Hospital Course:  Hospital Course 1. Hyponatremia: held her diuretic, Lasix, as this is the likely contributing factor to her hyponatremia. continued normal saline intravenous fluid hydration with 30 mL an hour and monitor, Sodium improving 134 on discharege, councelled about restricted fluid intake- as she said" I drink a lot of water and take 80 mg lasix daily". 2. Hypertension: Continued with Norvasc. Held Lasix.  3. Chronic obstructive pulmonary  disease: no active wheezing. Continued Advair and Spiriva.   4. Chronic leg wound: she already follows at wound care clinic. 5. Venous thromboembolism prophylaxis with heparin subcutaneous.   Condition on Discharge Stable   Code Status:  Code Status Full Code   DISCHARGE INSTRUCTIONS HOME MEDS:  Medication Reconciliation: Patient's Home Medications at Discharge:     Medication Instructions  amlodipine 10 mg oral tablet  1 tab(s) orally once a day   lisinopril 20 mg oral tablet  1 tab(s) orally once a day   vitamin b-12  1 tab(s) orally once a day   multivitamin  1 tab(s) orally once a day   vitamin d3  1 tab(s) orally once a day   fish oil  1 cap(s) orally once a day   diphenhydramine 25 mg oral capsule  1 cap(s) orally 3 times a day, As Needed   alprazolam 0.25 mg oral tablet  1 tab(s) orally every 8 hours x 5 days, As Needed, anxiety , As needed, anxiety   tiotropium 18 mcg inhalation capsule  1 cap(s) inhaled once a day   oxycodone 5 mg oral tablet  1 tab(s) orally every 6 hours x 4 days, As Needed, pain , As needed, pain    STOP TAKING THE FOLLOWING MEDICATION(S):    furosemide 40 mg oral tablet: 1 tab(s) orally 2 times a day atenolol 50 mg oral tablet: 1 tab(s) orally once a day [facility]. john's wort - oral tablet: 4 tab(s) orally once a day potassium chloride: 1 tab(s) orally once a day  Physician's Instructions:  Home Health? Yes   Bell Therapy  Nurse Aid   Diet Low Sodium   Activity Limitations As tolerated   Return to Work Not Applicable   Time frame for Follow Up Appointment 1-2 weeks  PMD to check Sodium level.     Hortencia Pilar M(Family Physician): [facility], [address] [place], [place] [address], [place]  Electronic Signatures: Vaughan Basta (MD)  (Signed 15-Jun-15 08:26)  Authored: ADMISSION DATE AND DIAGNOSIS, CHIEF COMPLAINT/HPI, Allergies, PERTINENT LABS, PERTINENT PAST HISTORY, HOSPITAL COURSE,  [place], PATIENT INSTRUCTIONS, Follow Up Physician   Last Updated: 15-Jun-15 08:26 by Vaughan Basta (MD)

## 2014-12-27 DIAGNOSIS — M545 Low back pain, unspecified: Secondary | ICD-10-CM | POA: Insufficient documentation

## 2014-12-28 ENCOUNTER — Other Ambulatory Visit: Payer: Self-pay | Admitting: Unknown Physician Specialty

## 2014-12-28 DIAGNOSIS — M5440 Lumbago with sciatica, unspecified side: Secondary | ICD-10-CM

## 2015-01-03 ENCOUNTER — Ambulatory Visit
Admission: RE | Admit: 2015-01-03 | Discharge: 2015-01-03 | Disposition: A | Discharge status: Home / Self Care | Payer: Commercial Managed Care - HMO | Source: Ambulatory Visit | Attending: Unknown Physician Specialty | Admitting: Unknown Physician Specialty

## 2015-01-03 DIAGNOSIS — M47896 Other spondylosis, lumbar region: Secondary | ICD-10-CM | POA: Insufficient documentation

## 2015-01-03 DIAGNOSIS — M5440 Lumbago with sciatica, unspecified side: Secondary | ICD-10-CM

## 2015-01-03 DIAGNOSIS — M545 Low back pain: Secondary | ICD-10-CM | POA: Diagnosis present

## 2015-05-09 DIAGNOSIS — M5136 Other intervertebral disc degeneration, lumbar region: Secondary | ICD-10-CM | POA: Insufficient documentation

## 2015-05-09 DIAGNOSIS — M5116 Intervertebral disc disorders with radiculopathy, lumbar region: Secondary | ICD-10-CM | POA: Insufficient documentation

## 2015-06-09 ENCOUNTER — Other Ambulatory Visit: Payer: Self-pay | Admitting: Orthopedic Surgery

## 2015-06-09 DIAGNOSIS — M160 Bilateral primary osteoarthritis of hip: Secondary | ICD-10-CM

## 2015-06-11 DIAGNOSIS — M247 Protrusio acetabuli: Secondary | ICD-10-CM | POA: Insufficient documentation

## 2015-06-15 ENCOUNTER — Other Ambulatory Visit: Payer: Commercial Managed Care - HMO

## 2015-06-15 ENCOUNTER — Ambulatory Visit
Admission: RE | Admit: 2015-06-15 | Discharge: 2015-06-15 | Disposition: A | Discharge status: Home / Self Care | Payer: Commercial Managed Care - HMO | Source: Ambulatory Visit | Attending: Orthopedic Surgery | Admitting: Orthopedic Surgery

## 2015-06-15 ENCOUNTER — Other Ambulatory Visit: Payer: Self-pay | Admitting: Orthopedic Surgery

## 2015-06-15 DIAGNOSIS — M160 Bilateral primary osteoarthritis of hip: Secondary | ICD-10-CM

## 2015-06-15 DIAGNOSIS — S32401A Unspecified fracture of right acetabulum, initial encounter for closed fracture: Secondary | ICD-10-CM | POA: Diagnosis not present

## 2015-06-15 DIAGNOSIS — S72052A Unspecified fracture of head of left femur, initial encounter for closed fracture: Secondary | ICD-10-CM | POA: Insufficient documentation

## 2015-06-15 DIAGNOSIS — S32402A Unspecified fracture of left acetabulum, initial encounter for closed fracture: Secondary | ICD-10-CM | POA: Diagnosis not present

## 2015-06-15 DIAGNOSIS — M247 Protrusio acetabuli: Secondary | ICD-10-CM | POA: Diagnosis not present

## 2015-06-15 DIAGNOSIS — M8580 Other specified disorders of bone density and structure, unspecified site: Secondary | ICD-10-CM | POA: Diagnosis not present

## 2015-06-15 DIAGNOSIS — X58XXXA Exposure to other specified factors, initial encounter: Secondary | ICD-10-CM | POA: Diagnosis not present

## 2015-11-06 DIAGNOSIS — M169 Osteoarthritis of hip, unspecified: Secondary | ICD-10-CM | POA: Insufficient documentation

## 2015-11-08 DIAGNOSIS — I5032 Chronic diastolic (congestive) heart failure: Secondary | ICD-10-CM | POA: Insufficient documentation

## 2015-11-08 DIAGNOSIS — I272 Pulmonary hypertension, unspecified: Secondary | ICD-10-CM | POA: Insufficient documentation

## 2015-11-10 ENCOUNTER — Encounter
Admission: RE | Admit: 2015-11-10 | Discharge: 2015-11-10 | Disposition: A | Discharge status: Home / Self Care | Payer: Commercial Managed Care - HMO | Source: Ambulatory Visit | Attending: Internal Medicine | Admitting: Internal Medicine

## 2015-11-11 ENCOUNTER — Encounter: Payer: Self-pay | Admitting: Emergency Medicine

## 2015-11-11 ENCOUNTER — Other Ambulatory Visit: Payer: Self-pay

## 2015-11-11 ENCOUNTER — Emergency Department: Payer: Commercial Managed Care - HMO

## 2015-11-11 ENCOUNTER — Inpatient Hospital Stay
Admission: EM | Admit: 2015-11-11 | Discharge: 2015-11-13 | DRG: 311 | Disposition: A | Discharge status: Skilled Nursing Facility | Payer: Commercial Managed Care - HMO | Attending: Internal Medicine | Admitting: Internal Medicine

## 2015-11-11 DIAGNOSIS — Z886 Allergy status to analgesic agent status: Secondary | ICD-10-CM

## 2015-11-11 DIAGNOSIS — Z79899 Other long term (current) drug therapy: Secondary | ICD-10-CM

## 2015-11-11 DIAGNOSIS — E876 Hypokalemia: Secondary | ICD-10-CM | POA: Diagnosis present

## 2015-11-11 DIAGNOSIS — Z87891 Personal history of nicotine dependence: Secondary | ICD-10-CM

## 2015-11-11 DIAGNOSIS — Z96641 Presence of right artificial hip joint: Secondary | ICD-10-CM | POA: Diagnosis present

## 2015-11-11 DIAGNOSIS — R0902 Hypoxemia: Secondary | ICD-10-CM

## 2015-11-11 DIAGNOSIS — R0602 Shortness of breath: Secondary | ICD-10-CM

## 2015-11-11 DIAGNOSIS — I248 Other forms of acute ischemic heart disease: Principal | ICD-10-CM | POA: Diagnosis present

## 2015-11-11 DIAGNOSIS — I214 Non-ST elevation (NSTEMI) myocardial infarction: Secondary | ICD-10-CM | POA: Diagnosis present

## 2015-11-11 DIAGNOSIS — Z7982 Long term (current) use of aspirin: Secondary | ICD-10-CM

## 2015-11-11 DIAGNOSIS — R7989 Other specified abnormal findings of blood chemistry: Secondary | ICD-10-CM

## 2015-11-11 DIAGNOSIS — R778 Other specified abnormalities of plasma proteins: Secondary | ICD-10-CM

## 2015-11-11 DIAGNOSIS — D638 Anemia in other chronic diseases classified elsewhere: Secondary | ICD-10-CM | POA: Diagnosis present

## 2015-11-11 DIAGNOSIS — J449 Chronic obstructive pulmonary disease, unspecified: Secondary | ICD-10-CM | POA: Diagnosis present

## 2015-11-11 DIAGNOSIS — Z888 Allergy status to other drugs, medicaments and biological substances status: Secondary | ICD-10-CM

## 2015-11-11 DIAGNOSIS — M79606 Pain in leg, unspecified: Secondary | ICD-10-CM | POA: Diagnosis present

## 2015-11-11 DIAGNOSIS — I509 Heart failure, unspecified: Secondary | ICD-10-CM | POA: Diagnosis present

## 2015-11-11 DIAGNOSIS — I110 Hypertensive heart disease with heart failure: Secondary | ICD-10-CM | POA: Diagnosis present

## 2015-11-11 DIAGNOSIS — G8929 Other chronic pain: Secondary | ICD-10-CM | POA: Diagnosis present

## 2015-11-11 HISTORY — DX: Heart failure, unspecified: I50.9

## 2015-11-11 HISTORY — DX: Emphysema, unspecified: J43.9

## 2015-11-11 HISTORY — DX: Essential (primary) hypertension: I10

## 2015-11-11 LAB — CBC WITH DIFFERENTIAL/PLATELET
BASOS PCT: 1 %
Basophils Absolute: 0 10*3/uL (ref 0–0.1)
EOS ABS: 0.1 10*3/uL (ref 0–0.7)
Eosinophils Relative: 3 %
HCT: 23.6 % — ABNORMAL LOW (ref 35.0–47.0)
HEMOGLOBIN: 8 g/dL — AB (ref 12.0–16.0)
Lymphocytes Relative: 11 %
Lymphs Abs: 0.5 10*3/uL — ABNORMAL LOW (ref 1.0–3.6)
MCH: 30.3 pg (ref 26.0–34.0)
MCHC: 33.7 g/dL (ref 32.0–36.0)
MCV: 90 fL (ref 80.0–100.0)
Monocytes Absolute: 0.5 10*3/uL (ref 0.2–0.9)
Monocytes Relative: 12 %
NEUTROS PCT: 73 %
Neutro Abs: 3.2 10*3/uL (ref 1.4–6.5)
Platelets: 306 10*3/uL (ref 150–440)
RBC: 2.63 MIL/uL — AB (ref 3.80–5.20)
RDW: 12.7 % (ref 11.5–14.5)
WBC: 4.4 10*3/uL (ref 3.6–11.0)

## 2015-11-11 LAB — COMPREHENSIVE METABOLIC PANEL
ALBUMIN: 2.6 g/dL — AB (ref 3.5–5.0)
ALK PHOS: 68 U/L (ref 38–126)
ALT: 12 U/L — AB (ref 14–54)
AST: 32 U/L (ref 15–41)
Anion gap: 14 (ref 5–15)
BUN: 29 mg/dL — ABNORMAL HIGH (ref 6–20)
CALCIUM: 8.2 mg/dL — AB (ref 8.9–10.3)
CHLORIDE: 97 mmol/L — AB (ref 101–111)
CO2: 26 mmol/L (ref 22–32)
CREATININE: 0.98 mg/dL (ref 0.44–1.00)
GFR calc Af Amer: >60 mL/min (ref >60)
GFR calc non Af Amer: 55 mL/min — ABNORMAL LOW (ref >60)
GLUCOSE: 92 mg/dL (ref 65–99)
Potassium: 2.8 mmol/L — CL (ref 3.5–5.1)
SODIUM: 137 mmol/L (ref 135–145)
Total Bilirubin: 1.1 mg/dL (ref 0.3–1.2)
Total Protein: 5.2 g/dL — ABNORMAL LOW (ref 6.5–8.1)

## 2015-11-11 LAB — TROPONIN I: Troponin I: 0.19 ng/mL — ABNORMAL HIGH (ref <0.031)

## 2015-11-11 MED ORDER — ONDANSETRON HCL 4 MG/2ML IJ SOLN
INTRAMUSCULAR | Status: AC
  Administered 2015-11-11: 4 mg via INTRAVENOUS
  Filled 2015-11-11: qty 2

## 2015-11-11 MED ORDER — ALBUTEROL SULFATE (2.5 MG/3ML) 0.083% IN NEBU: 5.0000 mg | INHALATION_SOLUTION | Freq: Once | RESPIRATORY_TRACT | Status: DC

## 2015-11-11 MED ORDER — ONDANSETRON HCL 4 MG/2ML IJ SOLN
4.0000 mg | Freq: Once | INTRAMUSCULAR | Status: AC
  Administered 2015-11-11: 4 mg via INTRAVENOUS

## 2015-11-11 MED ORDER — MORPHINE SULFATE (PF) 4 MG/ML IV SOLN
INTRAVENOUS | Status: AC
  Administered 2015-11-11: 4 mg via INTRAVENOUS
  Filled 2015-11-11: qty 1

## 2015-11-11 MED ORDER — MORPHINE SULFATE (PF) 4 MG/ML IV SOLN
4.0000 mg | Freq: Once | INTRAVENOUS | Status: AC
  Administered 2015-11-11: 4 mg via INTRAVENOUS

## 2015-11-11 MED ORDER — MORPHINE SULFATE (PF) 4 MG/ML IV SOLN
INTRAVENOUS | Status: AC
  Filled 2015-11-11: qty 1

## 2015-11-11 MED ORDER — IOPAMIDOL (ISOVUE-370) INJECTION 76%
60.0000 mL | Freq: Once | INTRAVENOUS | Status: AC | PRN
  Administered 2015-11-11: 60 mL via INTRAVENOUS

## 2015-11-11 NOTE — ED Provider Notes (Signed)
[facility] Emergency Department Provider Note  Time seen: 11:31 PM  I have reviewed the triage vital signs and the nursing notes.   HISTORY  Chief Complaint Shortness of Breath    HPI Jacqueline Boyd is a 75 y.o. female with a past medical history of hypertension, CHF who presents the emergency department with shortness breath. According to EMS patient had a right hip replacement 4 days ago performed at [facility]. Patient is currently at [facility] rehabilitation center. She was noted tonight by the nursing staff to have a pulse ox saturation of 85% on room air. Patient states at that time she had the sensation of having difficulty breathing. EMS was called to bring the patient's hospital. EMS states initially the patient had a systolic blood pressure of approximately 90-95, but that improved upon arrival to the emergency department. They state the patient had no difficulty breathing, was satting in the mid 90s on 2 L of oxygen by nasal cannula.     Past Medical History  Diagnosis Date  . CHF (congestive heart failure) ([place])   . Hypertension     There are no active problems to display for this patient.   Past Surgical History  Procedure Laterality Date  . Hip surgery Right     Current Outpatient Rx  Name  Route  Sig  Dispense  Refill  . acetaminophen (TYLENOL) 500 MG tablet   Oral   Take 1,000 mg by mouth every 8 (eight) hours as needed.         Marland Kitchen albuterol (PROVENTIL HFA;VENTOLIN HFA) 108 (90 Base) MCG/ACT inhaler   Inhalation   Inhale 1-2 puffs into the lungs every 6 (six) hours as needed for wheezing or shortness of breath.         Marland Kitchen amLODipine (NORVASC) 10 MG tablet   Oral   Take 10 mg by mouth daily.         Marland Kitchen aspirin EC 325 MG tablet   Oral   Take 325 mg by mouth daily.         Marland Kitchen atenolol (TENORMIN) 50 MG tablet   Oral   Take 50 mg by mouth daily.         . B Complex-C (B-COMPLEX WITH VITAMIN C) tablet   Oral   Take 1  tablet by mouth daily.         Marland Kitchen docusate sodium (COLACE) 100 MG capsule   Oral   Take 100 mg by mouth 2 (two) times daily as needed for mild constipation.         . furosemide (LASIX) 40 MG tablet   Oral   Take 40 mg by mouth 2 (two) times daily.         Marland Kitchen lisinopril (PRINIVIL,ZESTRIL) 20 MG tablet   Oral   Take 20 mg by mouth daily.         . magnesium hydroxide (MILK OF MAGNESIA) 400 MG/5ML suspension   Oral   Take 30 mLs by mouth every 4 (four) hours as needed for mild constipation.         . Multiple Vitamin (MULTIVITAMIN WITH MINERALS) TABS tablet   Oral   Take 1 tablet by mouth daily.         . ondansetron (ZOFRAN) 4 MG tablet   Oral   Take 4 mg by mouth every 6 (six) hours as needed for nausea or vomiting.         Marland Kitchen oxyCODONE (OXY IR/ROXICODONE) 5 MG immediate  release tablet   Oral   Take 5-10 mg by mouth every 4 (four) hours as needed for moderate pain or severe pain.           Allergies Codeine and Prednisone  No family history on file.  Social History Social History  Substance Use Topics  . Smoking status: Former Smoker    Quit date: 11/11/1990  . Smokeless tobacco: None  . Alcohol Use: Yes    Review of Systems Constitutional: Negative for fever. Cardiovascular: Negative for chest pain. Respiratory: Positive for shortness of breath. Denies any currently. Gastrointestinal: Negative for abdominal pain Musculoskeletal: Negative for back pain. Neurological: Negative for headache 10-point ROS otherwise negative.  ____________________________________________   PHYSICAL EXAM:  VITAL SIGNS: ED Triage Vitals  Enc Vitals Group     BP 11/11/15 2156 117/48 mmHg     Pulse Rate 11/11/15 2156 74     Resp 11/11/15 2156 16     Temp 11/11/15 2156 98.1 F (36.7 C)     Temp Source 11/11/15 2156 Oral     SpO2 11/11/15 2156 98 %     Weight 11/11/15 2156 97 lb (43.999 kg)     Height 11/11/15 2156 5\' 8"  (1.727 m)     Head Cir --      Peak  Flow --      Pain Score 11/11/15 2200 8     Pain Loc --      Pain Edu? --      Excl. in [place]? --     Constitutional: Alert and oriented. Well appearing and in no distress. Eyes: Normal exam ENT   Head: Normocephalic and atraumatic.   Mouth/Throat: Mucous membranes are moist. Cardiovascular: Normal rate, regular rhythm. No murmur Respiratory: Normal respiratory effort without tachypnea nor retractions. Breath sounds are clear  Gastrointestinal: Soft and nontender. No distention. Musculoskeletal: Nontender with normal range of motion in all extremities. Mild tenderness of the right hip, patient states largely unchanged. No calf tenderness or significant lower extremity swelling. Neurologic:  Normal speech and language. No gross focal neurologic deficits  Skin:  Skin is warm, dry and intact.  Psychiatric: Mood and affect are normal.   ____________________________________________    EKG  EKG reviewed and interpreted by myself shows what appears to be an accelerated junctional rhythm around 74 bpm, slightly widened QRS, normal axis, nonspecific but no concerning ST changes.  ____________________________________________    RADIOLOGY  CT angiography pending  ____________________________________________   INITIAL IMPRESSION / ASSESSMENT AND PLAN / ED COURSE  Pertinent labs & imaging results that were available during my care of the patient were reviewed by me and considered in my medical decision making (see chart for details).  The patient presents the emergency department with difficulty breathing at her rehabilitation under. Reported oxygen saturation of 85% on room air, no oxygen requirement at baseline. Patient is on pain medication. Denies any trouble breathing at this time. Patient states she thinks she had a panic attack and felt felt like she could not get a full breath of air. Denies any symptoms at this time.  Patient's labs are significant for hemoglobin of 8.0  however the patient's discharge hemoglobin from [facility] 6/15 is a 8.3, largely unchanged. Patient's troponin is elevated 0.19. We'll proceed with a CT angiography to rule out pulmonary embolism.  ____________________________________________   FINAL CLINICAL IMPRESSION(S) / ED DIAGNOSES  Dyspnea Hypoxia   Harvest Dark, MD 11/12/15 [phone]

## 2015-11-11 NOTE — ED Notes (Signed)
Pt presents via [facility] with SOB. EMS reports that pt was 85% on RA upon arrival and went to 94% on 2L O2. Per EMS, pt initially had BP of 90/51 so EMS gave 200cc fluid bolus with BP registering 10/61 after bolus. Pt reports that she had rt hip surgery this past Monday and the Dr at [facility] sent pt here.

## 2015-11-12 ENCOUNTER — Encounter: Payer: Self-pay | Admitting: Internal Medicine

## 2015-11-12 DIAGNOSIS — I214 Non-ST elevation (NSTEMI) myocardial infarction: Secondary | ICD-10-CM | POA: Diagnosis present

## 2015-11-12 DIAGNOSIS — G8929 Other chronic pain: Secondary | ICD-10-CM | POA: Diagnosis present

## 2015-11-12 DIAGNOSIS — I509 Heart failure, unspecified: Secondary | ICD-10-CM | POA: Diagnosis present

## 2015-11-12 DIAGNOSIS — M79606 Pain in leg, unspecified: Secondary | ICD-10-CM | POA: Diagnosis present

## 2015-11-12 DIAGNOSIS — Z79899 Other long term (current) drug therapy: Secondary | ICD-10-CM | POA: Diagnosis not present

## 2015-11-12 DIAGNOSIS — D638 Anemia in other chronic diseases classified elsewhere: Secondary | ICD-10-CM | POA: Diagnosis present

## 2015-11-12 DIAGNOSIS — I248 Other forms of acute ischemic heart disease: Secondary | ICD-10-CM | POA: Diagnosis present

## 2015-11-12 DIAGNOSIS — Z888 Allergy status to other drugs, medicaments and biological substances status: Secondary | ICD-10-CM | POA: Diagnosis not present

## 2015-11-12 DIAGNOSIS — E876 Hypokalemia: Secondary | ICD-10-CM | POA: Diagnosis present

## 2015-11-12 DIAGNOSIS — I110 Hypertensive heart disease with heart failure: Secondary | ICD-10-CM | POA: Diagnosis present

## 2015-11-12 DIAGNOSIS — Z87891 Personal history of nicotine dependence: Secondary | ICD-10-CM | POA: Diagnosis not present

## 2015-11-12 DIAGNOSIS — R0602 Shortness of breath: Secondary | ICD-10-CM | POA: Diagnosis present

## 2015-11-12 DIAGNOSIS — Z7982 Long term (current) use of aspirin: Secondary | ICD-10-CM | POA: Diagnosis not present

## 2015-11-12 DIAGNOSIS — J449 Chronic obstructive pulmonary disease, unspecified: Secondary | ICD-10-CM | POA: Diagnosis present

## 2015-11-12 DIAGNOSIS — Z886 Allergy status to analgesic agent status: Secondary | ICD-10-CM | POA: Diagnosis not present

## 2015-11-12 DIAGNOSIS — Z96641 Presence of right artificial hip joint: Secondary | ICD-10-CM | POA: Diagnosis present

## 2015-11-12 LAB — MAGNESIUM: Magnesium: 1.7 mg/dL (ref 1.7–2.4)

## 2015-11-12 LAB — BASIC METABOLIC PANEL
Anion gap: 8 (ref 5–15)
BUN: 26 mg/dL — AB (ref 6–20)
CALCIUM: 8.3 mg/dL — AB (ref 8.9–10.3)
CHLORIDE: 101 mmol/L (ref 101–111)
CO2: 29 mmol/L (ref 22–32)
CREATININE: 0.87 mg/dL (ref 0.44–1.00)
GFR calc Af Amer: >60 mL/min (ref >60)
Glucose, Bld: 95 mg/dL (ref 65–99)
Potassium: 4 mmol/L (ref 3.5–5.1)
SODIUM: 138 mmol/L (ref 135–145)

## 2015-11-12 LAB — TSH: TSH: 3.629 u[IU]/mL (ref 0.350–4.500)

## 2015-11-12 LAB — SURGICAL PCR SCREEN
MRSA, PCR: NEGATIVE
Staphylococcus aureus: NEGATIVE

## 2015-11-12 LAB — HEMOGLOBIN: HEMOGLOBIN: 8 g/dL — AB (ref 12.0–16.0)

## 2015-11-12 LAB — TROPONIN I
Troponin I: 0.18 ng/mL — ABNORMAL HIGH (ref <0.031)
Troponin I: 0.16 ng/mL — ABNORMAL HIGH (ref <0.031)

## 2015-11-12 LAB — HEMOGLOBIN A1C: Hgb A1c MFr Bld: 5.4 % (ref 4.0–6.0)

## 2015-11-12 MED ORDER — MAGNESIUM HYDROXIDE 400 MG/5ML PO SUSP: 30.0000 mL | ORAL | Status: DC | PRN

## 2015-11-12 MED ORDER — ONDANSETRON HCL 4 MG PO TABS: 4.0000 mg | ORAL_TABLET | Freq: Four times a day (QID) | ORAL | Status: DC | PRN

## 2015-11-12 MED ORDER — ACETAMINOPHEN 650 MG RE SUPP
650.0000 mg | Freq: Four times a day (QID) | RECTAL | Status: DC | PRN
Start: 2015-11-12 — End: 2015-11-13

## 2015-11-12 MED ORDER — POTASSIUM CHLORIDE 20 MEQ/15ML (10%) PO SOLN
40.0000 meq | ORAL | Status: AC
  Administered 2015-11-12: 40 meq via ORAL
  Filled 2015-11-12: qty 30

## 2015-11-12 MED ORDER — ENSURE ENLIVE PO LIQD
237.0000 mL | Freq: Three times a day (TID) | ORAL | Status: DC
  Administered 2015-11-12 – 2015-11-13 (×3): 237 mL via ORAL

## 2015-11-12 MED ORDER — ONDANSETRON HCL 4 MG/2ML IJ SOLN: 4.0000 mg | Freq: Four times a day (QID) | INTRAMUSCULAR | Status: DC | PRN

## 2015-11-12 MED ORDER — FUROSEMIDE 10 MG/ML IJ SOLN
INTRAMUSCULAR | Status: AC
  Filled 2015-11-12: qty 4

## 2015-11-12 MED ORDER — ENOXAPARIN SODIUM 60 MG/0.6ML ~~LOC~~ SOLN
1.0000 mg/kg | Freq: Two times a day (BID) | SUBCUTANEOUS | Status: DC
  Administered 2015-11-12: 45 mg via SUBCUTANEOUS
  Filled 2015-11-12: qty 0.6

## 2015-11-12 MED ORDER — ASPIRIN EC 325 MG PO TBEC
325.0000 mg | DELAYED_RELEASE_TABLET | Freq: Every day | ORAL | Status: DC
  Administered 2015-11-12 – 2015-11-13 (×2): 325 mg via ORAL
  Filled 2015-11-12 (×2): qty 1

## 2015-11-12 MED ORDER — FUROSEMIDE 10 MG/ML IJ SOLN
40.0000 mg | INTRAMUSCULAR | Status: AC
  Administered 2015-11-12: 40 mg via INTRAVENOUS

## 2015-11-12 MED ORDER — ADULT MULTIVITAMIN W/MINERALS CH
1.0000 | ORAL_TABLET | Freq: Every day | ORAL | Status: DC
  Administered 2015-11-12 – 2015-11-13 (×2): 1 via ORAL
  Filled 2015-11-12 (×2): qty 1

## 2015-11-12 MED ORDER — AMLODIPINE BESYLATE 5 MG PO TABS
10.0000 mg | ORAL_TABLET | Freq: Every day | ORAL | Status: DC
  Administered 2015-11-13: 10 mg via ORAL
  Filled 2015-11-12 (×2): qty 2

## 2015-11-12 MED ORDER — ENSURE ENLIVE PO LIQD: 237.0000 mL | Freq: Two times a day (BID) | ORAL | Status: DC

## 2015-11-12 MED ORDER — ALPRAZOLAM 0.25 MG PO TABS
0.2500 mg | ORAL_TABLET | Freq: Three times a day (TID) | ORAL | Status: DC | PRN
  Administered 2015-11-12 – 2015-11-13 (×3): 0.25 mg via ORAL
  Filled 2015-11-12 (×3): qty 1

## 2015-11-12 MED ORDER — B COMPLEX-C PO TABS
1.0000 | ORAL_TABLET | Freq: Every day | ORAL | Status: DC
  Administered 2015-11-12 – 2015-11-13 (×2): 1 via ORAL
  Filled 2015-11-12 (×2): qty 1

## 2015-11-12 MED ORDER — ASPIRIN EC 81 MG PO TBEC: 81.0000 mg | DELAYED_RELEASE_TABLET | Freq: Every day | ORAL | Status: DC

## 2015-11-12 MED ORDER — MORPHINE SULFATE (PF) 2 MG/ML IV SOLN
2.0000 mg | INTRAVENOUS | Status: DC | PRN
  Administered 2015-11-12 (×2): 2 mg via INTRAVENOUS
  Filled 2015-11-12 (×2): qty 1

## 2015-11-12 MED ORDER — ATENOLOL 25 MG PO TABS
50.0000 mg | ORAL_TABLET | Freq: Every day | ORAL | Status: DC
Start: 2015-11-12 — End: 2015-11-13
  Administered 2015-11-13: 50 mg via ORAL
  Filled 2015-11-12 (×2): qty 2

## 2015-11-12 MED ORDER — DOCUSATE SODIUM 100 MG PO CAPS: 100.0000 mg | ORAL_CAPSULE | Freq: Two times a day (BID) | ORAL | Status: DC | PRN

## 2015-11-12 MED ORDER — POTASSIUM CHLORIDE IN NACL 40-0.9 MEQ/L-% IV SOLN
INTRAVENOUS | Status: DC
  Administered 2015-11-12: 100 mL/h via INTRAVENOUS
  Filled 2015-11-12 (×3): qty 1000

## 2015-11-12 MED ORDER — SODIUM CHLORIDE 0.9% FLUSH
3.0000 mL | Freq: Two times a day (BID) | INTRAVENOUS | Status: DC
  Administered 2015-11-12 – 2015-11-13 (×3): 3 mL via INTRAVENOUS

## 2015-11-12 MED ORDER — LISINOPRIL 10 MG PO TABS
20.0000 mg | ORAL_TABLET | Freq: Every day | ORAL | Status: DC
  Administered 2015-11-13: 20 mg via ORAL
  Filled 2015-11-12 (×2): qty 2

## 2015-11-12 MED ORDER — OXYCODONE HCL 5 MG PO TABS
5.0000 mg | ORAL_TABLET | Freq: Four times a day (QID) | ORAL | Status: DC | PRN
  Administered 2015-11-12 – 2015-11-13 (×5): 10 mg via ORAL
  Filled 2015-11-12 (×5): qty 2

## 2015-11-12 MED ORDER — ACETAMINOPHEN 325 MG PO TABS
650.0000 mg | ORAL_TABLET | Freq: Four times a day (QID) | ORAL | Status: DC | PRN
  Administered 2015-11-13: 650 mg via ORAL
  Filled 2015-11-12: qty 2

## 2015-11-12 MED ORDER — ATORVASTATIN CALCIUM 20 MG PO TABS
40.0000 mg | ORAL_TABLET | Freq: Every day | ORAL | Status: DC
  Administered 2015-11-12 – 2015-11-13 (×2): 40 mg via ORAL
  Filled 2015-11-12 (×2): qty 2

## 2015-11-12 MED ORDER — DIPHENHYDRAMINE HCL 50 MG/ML IJ SOLN: 12.5000 mg | Freq: Four times a day (QID) | INTRAMUSCULAR | Status: DC | PRN

## 2015-11-12 MED ORDER — ENOXAPARIN SODIUM 40 MG/0.4ML ~~LOC~~ SOLN
40.0000 mg | SUBCUTANEOUS | Status: DC
  Administered 2015-11-13: 40 mg via SUBCUTANEOUS
  Filled 2015-11-12: qty 0.4

## 2015-11-12 MED ORDER — BOOST PLUS PO LIQD
237.0000 mL | Freq: Three times a day (TID) | ORAL | Status: DC
  Filled 2015-11-12: qty 237

## 2015-11-12 MED ORDER — FUROSEMIDE 40 MG PO TABS
40.0000 mg | ORAL_TABLET | Freq: Two times a day (BID) | ORAL | Status: DC
  Administered 2015-11-13 (×2): 40 mg via ORAL
  Filled 2015-11-12 (×2): qty 1

## 2015-11-12 NOTE — Progress Notes (Signed)
ANTICOAGULATION CONSULT NOTE - Initial Consult  Pharmacy Consult for Lovenox dosing Indication: chest pain/ACS/NSTEMI  Allergies  Allergen Reactions  . Codeine Other (See Comments)    Itching and "feels like worms crawling in body".  . Prednisone Palpitations    Patient Measurements: Height: 5\' 8"  (172.7 cm) Weight: 97 lb (43.999 kg) IBW/kg (Calculated) : 63.9 Heparin Dosing Weight: 44kg  Vital Signs: Temp: 98.4 F (36.9 C) (06/18 0317) Temp Source: Oral (06/18 0317) BP: 124/44 mmHg (06/18 0317) Pulse Rate: 80 (06/18 0317)  Labs:  Recent Labs  11/11/15 2210  HGB 8.0*  HCT 23.6*  PLT 306  CREATININE 0.98  TROPONINI 0.19*    Estimated Creatinine Clearance: 34.5 mL/min (by C-G formula based on Cr of 0.98).   Medical History: Past Medical History  Diagnosis Date  . CHF (congestive heart failure) ([place])   . Hypertension   . 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 F/u labs per protocol.  Maelee Hoot S 11/12/2015,3:18 AM

## 2015-11-12 NOTE — Progress Notes (Signed)
NSR. Bedpan. 2 L of oxygen. Takes meds ok. Pt complained of pain Family at the bedside. Pt has no further concerns at this time.

## 2015-11-12 NOTE — H&P (Addendum)
Jacqueline Boyd is an 74 y.o. female.   Chief Complaint: Shortness of breath HPI: The patient with past medical history significant for emphysema was discharged from outlying facility today status post right hip arthroplasty for rehabilitation and recovery at skilled nursing facility was way from the hospital for matter of hours before she complained of acute shortness of breath. The patient denies cough, chest pain, nausea, vomiting or fevers. She has not had any diarrhea nor any swelling of her lower extremities. In the emergency department she received breathing treatment and supplemental oxygen. Routine laboratory evaluation revealed elevated troponin. Also, the patient's EKG showed junctional rhythm and remote anterior lateral infarct. The patient is unaware of any history of myocardial infarction or CHF, the latter of which is on her past medical history records. Due to elevated troponin and EKG changes emergency department staff called the hospitalist service for admission.  Past Medical History  Diagnosis Date  . CHF (congestive heart failure) (HCC)   . Hypertension   . COPD with emphysema (HCC)     Past Surgical History  Procedure Laterality Date  . Hip surgery Right   . 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 None Social History:  reports that she quit smoking about 25 years ago. She does not have any smokeless tobacco history on file. She reports that she drinks alcohol. She reports that she does not use illicit drugs.  Allergies:  Allergies  Allergen Reactions  . Codeine Other (See Comments)    Itching and "feels like worms crawling in body".  . Prednisone Palpitations    Medications Prior to Admission  Medication Sig Dispense Refill  . acetaminophen (TYLENOL) 500 MG tablet Take 1,000 mg by mouth every 8 (eight) hours as needed.    . albuterol (PROVENTIL HFA;VENTOLIN HFA) 108 (90 Base) MCG/ACT inhaler Inhale 1-2 puffs into the lungs every 6 (six)  hours as needed for wheezing or shortness of breath.    . amLODipine (NORVASC) 10 MG tablet Take 10 mg by mouth daily.    . aspirin EC 325 MG tablet Take 325 mg by mouth daily.    . atenolol (TENORMIN) 50 MG tablet Take 50 mg by mouth daily.    . B Complex-C (B-COMPLEX WITH VITAMIN C) tablet Take 1 tablet by mouth daily.    . docusate sodium (COLACE) 100 MG capsule Take 100 mg by mouth 2 (two) times daily as needed for mild constipation.    . furosemide (LASIX) 40 MG tablet Take 40 mg by mouth 2 (two) times daily.    . lisinopril (PRINIVIL,ZESTRIL) 20 MG tablet Take 20 mg by mouth daily.    . magnesium hydroxide (MILK OF MAGNESIA) 400 MG/5ML suspension Take 30 mLs by mouth every 4 (four) hours as needed for mild constipation.    . Multiple Vitamin (MULTIVITAMIN WITH MINERALS) TABS tablet Take 1 tablet by mouth daily.    . ondansetron (ZOFRAN) 4 MG tablet Take 4 mg by mouth every 6 (six) hours as needed for nausea or vomiting.    . oxyCODONE (OXY IR/ROXICODONE) 5 MG immediate release tablet Take 5-10 mg by mouth every 4 (four) hours as needed for moderate pain or severe pain.      Results for orders placed or performed during the hospital encounter of 11/11/15 (from the past 48 hour(s))  CBC with Differential     Status: Abnormal   Collection Time: 11/11/15 10:10 PM  Result Value Ref   Range   WBC 4.4 3.6 - 11.0 K/uL   RBC 2.63 (L) 3.80 - 5.20 MIL/uL   Hemoglobin 8.0 (L) 12.0 - 16.0 g/dL   HCT 23.6 (L) 35.0 - 47.0 %   MCV 90.0 80.0 - 100.0 fL   MCH 30.3 26.0 - 34.0 pg   MCHC 33.7 32.0 - 36.0 g/dL   RDW 12.7 11.5 - 14.5 %   Platelets 306 150 - 440 K/uL   Neutrophils Relative % 73 %   Neutro Abs 3.2 1.4 - 6.5 K/uL   Lymphocytes Relative 11 %   Lymphs Abs 0.5 (L) 1.0 - 3.6 K/uL   Monocytes Relative 12 %   Monocytes Absolute 0.5 0.2 - 0.9 K/uL   Eosinophils Relative 3 %   Eosinophils Absolute 0.1 0 - 0.7 K/uL   Basophils Relative 1 %   Basophils Absolute 0.0 0 - 0.1 K/uL  Comprehensive  metabolic panel     Status: Abnormal   Collection Time: 11/11/15 10:10 PM  Result Value Ref Range   Sodium 137 135 - 145 mmol/L   Potassium 2.8 (LL) 3.5 - 5.1 mmol/L    Comment: CRITICAL RESULT CALLED TO, READ BACK BY AND VERIFIED WITH REBECCA LYNN AT 2322 11/11/15.PMH   Chloride 97 (L) 101 - 111 mmol/L   CO2 26 22 - 32 mmol/L   Glucose, Bld 92 65 - 99 mg/dL   BUN 29 (H) 6 - 20 mg/dL   Creatinine, Ser 0.98 0.44 - 1.00 mg/dL   Calcium 8.2 (L) 8.9 - 10.3 mg/dL   Total Protein 5.2 (L) 6.5 - 8.1 g/dL   Albumin 2.6 (L) 3.5 - 5.0 g/dL   AST 32 15 - 41 U/L   ALT 12 (L) 14 - 54 U/L   Alkaline Phosphatase 68 38 - 126 U/L   Total Bilirubin 1.1 0.3 - 1.2 mg/dL   GFR calc non Af Amer 55 (L) >60 mL/min   GFR calc Af Amer >60 >60 mL/min    Comment: (NOTE) The eGFR has been calculated using the CKD EPI equation. This calculation has not been validated in all clinical situations. eGFR's persistently <60 mL/min signify possible Chronic Kidney Disease.    Anion gap 14 5 - 15  Troponin I     Status: Abnormal   Collection Time: 11/11/15 10:10 PM  Result Value Ref Range   Troponin I 0.19 (H) <0.031 ng/mL    Comment: READ BACK AND VERIFIED WITH REBECCA LYNN AT 2322 11/11/15.PMH        PERSISTENTLY INCREASED TROPONIN VALUES IN THE RANGE OF 0.04-0.49 ng/mL CAN BE SEEN IN:       -UNSTABLE ANGINA       -CONGESTIVE HEART FAILURE       -MYOCARDITIS       -CHEST TRAUMA       -ARRYHTHMIAS       -LATE PRESENTING MYOCARDIAL INFARCTION       -COPD   CLINICAL FOLLOW-UP RECOMMENDED.   TSH     Status: None   Collection Time: 11/12/15  3:33 AM  Result Value Ref Range   TSH 3.629 0.350 - 4.500 uIU/mL  Troponin I     Status: Abnormal   Collection Time: 11/12/15  3:33 AM  Result Value Ref Range   Troponin I 0.18 (H) <0.031 ng/mL    Comment: PREVIOUS RESULT CALLED AT 2322 11/11/15.PMH        PERSISTENTLY INCREASED TROPONIN VALUES IN THE RANGE OF 0.04-0.49 ng/mL CAN BE SEEN IN:       -  UNSTABLE ANGINA        -CONGESTIVE HEART FAILURE       -MYOCARDITIS       -CHEST TRAUMA       -ARRYHTHMIAS       -LATE PRESENTING MYOCARDIAL INFARCTION       -COPD   CLINICAL FOLLOW-UP RECOMMENDED.   Surgical pcr screen     Status: None   Collection Time: 11/12/15  4:30 AM  Result Value Ref Range   MRSA, PCR NEGATIVE NEGATIVE   Staphylococcus aureus NEGATIVE NEGATIVE    Comment:        The Xpert SA Assay (FDA approved for NASAL specimens in patients over 4 years of age), is one component of a comprehensive surveillance program.  Test performance has been validated by [facility] for patients greater than or equal to 96 year old. It is not intended to diagnose infection nor to guide or monitor treatment.    Ct Angio Chest Pe W/cm &/or Wo Cm  11/11/2015  CLINICAL DATA:  Shortness of breath. Decreased oxygen saturations. Low blood pressure. Right hip surgery this past Monday. EXAM: CT ANGIOGRAPHY CHEST WITH CONTRAST TECHNIQUE: Multidetector CT imaging of the chest was performed using the standard protocol during bolus administration of intravenous contrast. Multiplanar CT image reconstructions and MIPs were obtained to evaluate the vascular anatomy. CONTRAST:  60 mL Isovue 370 COMPARISON:  None. FINDINGS: Technically adequate study with good opacification of the central and segmental pulmonary arteries. No focal filling defects are demonstrated. No evidence of significant pulmonary embolus. Normal heart size. Ectatic ascending thoracic aorta, measuring 3.4 cm diameter. Aortic calcification. No aortic dissection. Great vessel origins are patent. Coronary artery calcifications. Esophagus is decompressed. No significant lymphadenopathy in the chest. Moderate bilateral pleural effusions with basilar atelectasis bilaterally. Filling defect in the trachea probably represents mucoid secretions. Airways are otherwise patent. No pneumothorax. Emphysematous changes in the upper lungs. Included portions of the upper  abdominal organs are grossly unremarkable. Heterogeneous appearance of bone marrow probably representing osteoporosis or metabolic bone changes. No focal lesions identified. Degenerative changes in the spine. Review of the MIP images confirms the above findings. IMPRESSION: No evidence of significant pulmonary embolus. Filling defect in the trachea probably representing mucus. Bilateral pleural effusions with basilar atelectasis. Diffuse pulmonary emphysema. Electronically Signed   By: Lucienne Capers M.D.   On: 11/11/2015 23:59    Review of Systems  Constitutional: Negative for fever and chills.  HENT: Negative for sore throat and tinnitus.   Eyes: Negative for blurred vision and redness.  Respiratory: Positive for shortness of breath. Negative for cough.   Cardiovascular: Negative for chest pain, palpitations, orthopnea and PND.  Gastrointestinal: Negative for nausea, vomiting, abdominal pain and diarrhea.  Genitourinary: Negative for dysuria, urgency and frequency.  Musculoskeletal: Negative for myalgias and joint pain.  Skin: Negative for rash.       No lesions  Neurological: Negative for speech change, focal weakness and weakness.  Endo/Heme/Allergies: Does not bruise/bleed easily.       No temperature intolerance  Psychiatric/Behavioral: Negative for depression and suicidal ideas.    Blood pressure 124/44, pulse 80, temperature 98.4 F (36.9 C), temperature source Oral, resp. rate 18, height 5' 8" (1.727 m), weight 52.935 kg (116 lb 11.2 oz), SpO2 100 %. Physical Exam  Nursing note and vitals reviewed. Constitutional: She is oriented to person, place, and time. She appears well-developed and well-nourished. No distress.  HENT:  Head: Normocephalic and atraumatic.  Mouth/Throat: Oropharynx is clear and moist.  Eyes: Conjunctivae and EOM are normal. Pupils are equal, round, and reactive to light. No scleral icterus.  Neck: Normal range of motion. Neck supple. No JVD present. No  tracheal deviation present. No thyromegaly present.  Cardiovascular: Normal rate, regular rhythm and normal heart sounds.  Exam reveals no gallop and no friction rub.   No murmur heard. Respiratory: Effort normal and breath sounds normal.  Hard bony prominence right of mid sternum  GI: Soft. Bowel sounds are normal. She exhibits no distension. There is no tenderness.  Genitourinary:  Deferred  Musculoskeletal: She exhibits no edema.  Range of motion right leg limited by pain  Lymphadenopathy:    She has no cervical adenopathy.  Neurological: She is alert and oriented to person, place, and time. No cranial nerve deficit. She exhibits normal muscle tone.  Skin: Skin is warm and dry.  Wounds bilateral lower extremities dressed and clean  Psychiatric: She has a normal mood and affect. Her behavior is normal. Judgment and thought content normal.     Assessment/Plan This is a 75 year old female admitted for NSTEMI. 1. NSTEMI: I have anticoagulated the patient with therapeutic Lovenox. It somewhat on that she has had silent infarcts given she does not have a history of diabetes. Also, she is unaware of any history of heart disease. Her home medications include Lasix 40 mg by mouth twice a day however this appears to be relatively new given her normal creatinine clearance despite the fact the patient states she has barely eaten or drank anything in the last few days. Also intriguing is the fact the patient has a bony prominence that she states is from surgical changes since she was a child that is prominently in the area of a pacemaker. Initially a metal reflection that I have determined is from her telemetry leads was placed directly over this area of prominence. At first thought perhaps a ablation and pacemaker placement would have created her junctional rhythm but now this is more concerning for potential infarct throughout her septum affecting His fibers. None of this coincides however the fact that  the patient states that she has had cardiac workups in the past which have all been normal. Nonetheless, continue aspirin and atenolol. Continue to monitor telemetry. Follow cardiac enzymes. Cardiology has been consulted. 2. Essential hypertension: Continue lisinopril and amlodipine 3. Chronic lung disease: May be related to rheumatoid arthritis as the patient has some features such as boutonniere deformity. CTA of the chest ruled out pulmonary embolism. Interpretation from radiology states that there are some emphysematous changes which are present in the upper lobes bilaterally. Notably, she does not have this appearance or upper respiratory lungs. She is not on oxygen at home and was functional without exercise intolerance prior to surgery. Hypoxia may be related to moderate effusions bilaterally 4. DVT prophylaxis: As above 5. GI prophylaxis: None The patient is a full code. Time spent on admission was inpatient care approximately 45 minutes  Harrie Foreman, MD 11/12/2015, 7:23 AM

## 2015-11-12 NOTE — Progress Notes (Signed)
Nasotracheal suction performed. Pt tolerated suction from each nare well. Lubrication, sterile gloves and 37fr suction catheter used. Small amount of thick greenish tan mucous suctioned. O2 saturation on 2L [place] remained 96% and higher. No bleeding noted. There was more resistance in the left nare than right.

## 2015-11-12 NOTE — Progress Notes (Signed)
PT Cancellation Note  Patient Details Name: Jacqueline Boyd MRN: [id number] DOB: 1940-12-26   Cancelled Treatment:    Reason Eval/Treat Not Completed: Medical issues which prohibited therapy.  Will await recheck on K+ levels to see for therapy, but is cleared by cardiologist to increase activity when ready.   Ramond Dial 11/12/2015, 12:37 PM    Mee Hives, PT MS Acute Rehab Dept. Number: [place] and [place]

## 2015-11-12 NOTE — Progress Notes (Signed)
Patient stated to the RN that she's having anxiety attack and needed antianxiety medication. Dr. Verdell Carmine notified with a new order for Xanax 0.25 mg three times a  day   as needed. Will continue to monitor.

## 2015-11-12 NOTE — Progress Notes (Signed)
Subjective:  Patient doing reasonably well denies chest pain. Patient has not walked around no evidence of bleeding feels reasonably well. Continue pain control for leg discomfort.  Objective:  Vital Signs in the last 24 hours: Temp:  [98.1 F (36.7 C)-98.4 F (36.9 C)] 98.4 F (36.9 C) (06/18 0317) Pulse Rate:  [71-80] 77 (06/18 0829) Resp:  [12-20] 18 (06/18 0317) BP: (101-128)/(42-52) 110/44 mmHg (06/18 0829) SpO2:  [98 %-100 %] 100 % (06/18 0829) Weight:  [43.999 kg (97 lb)-52.935 kg (116 lb 11.2 oz)] 52.935 kg (116 lb 11.2 oz) (06/18 0317)  Intake/Output from previous day: 06/17 0701 - 06/18 0700 In: 350 [I.V.:350] Out: 500 [Urine:500] Intake/Output from this shift:    Physical Exam: General appearance: alert, cooperative and appears stated age Neck: no adenopathy, no carotid bruit, no JVD, supple, symmetrical, trachea midline and thyroid not enlarged, symmetric, no tenderness/mass/nodules Lungs: clear to auscultation bilaterally Heart: regular rate and rhythm, S1, S2 normal, no murmur, click, rub or gallop Abdomen: soft, non-tender; bowel sounds normal; no masses,  no organomegaly Extremities: extremities normal, atraumatic, no cyanosis or edema Pulses: 2+ and symmetric Skin: Skin color, texture, turgor normal. No rashes or lesions Neurologic: Alert and oriented X 3, normal strength and tone. Normal symmetric reflexes. Normal coordination and gait  Multiple skin tears on the right leg  Lab Results:  Recent Labs  11/11/15 2210  WBC 4.4  HGB 8.0*  PLT 306    Recent Labs  11/11/15 2210  NA 137  K 2.8*  CL 97*  CO2 26  GLUCOSE 92  BUN 29*  CREATININE 0.98    Recent Labs  11/12/15 0333 11/12/15 0850  TROPONINI 0.18* 0.16*   Hepatic Function Panel  Recent Labs  11/11/15 2210  PROT 5.2*  ALBUMIN 2.6*  AST 32  ALT 12*  ALKPHOS 68  BILITOT 1.1   No results for input(s): CHOL in the last 72 hours. No results for input(s): PROTIME in the last 72  hours.  Imaging: Imaging results have been reviewed  Cardiac Studies:  Assessment/Plan:  Elevated troponin troponin possibly demand ischemia Chronic attacks and anxiety Demand ischemia Abnormal EKG junctional rhythm Congestive heart failure Hypertension COPD Shortness of breath Anemia Status post hip surgery Chronic leg pain . 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 11/12/2015, 11:19 AM

## 2015-11-12 NOTE — Progress Notes (Signed)
[facility] NAME: Jacqueline Boyd    MR#:  [id number]  DATE OF BIRTH:  04-12-1941  SUBJECTIVE:  CHIEF COMPLAINT:   Chief Complaint  Patient presents with  . Shortness of Breath   No chest pain or shortness of breath REVIEW OF SYSTEMS:  CONSTITUTIONAL: No fever, Has generalized weakness.  EYES: No blurred or double vision.  EARS, NOSE, AND THROAT: No tinnitus or ear pain.  RESPIRATORY: No cough, shortness of breath, wheezing or hemoptysis.  CARDIOVASCULAR: No chest pain, orthopnea, edema.  GASTROINTESTINAL: No nausea, vomiting, diarrhea or abdominal pain.  GENITOURINARY: No dysuria, hematuria.  ENDOCRINE: No polyuria, nocturia,  HEMATOLOGY: No anemia, easy bruising or bleeding SKIN: No rash or lesion. MUSCULOSKELETAL: Leg pain on wound sites. NEUROLOGIC: No tingling, numbness, weakness.  PSYCHIATRY: No anxiety or depression.   DRUG ALLERGIES:   Allergies  Allergen Reactions  . Codeine Other (See Comments)    Itching and "feels like worms crawling in body".  . Prednisone Palpitations    VITALS:  Blood pressure 114/45, pulse 78, temperature 98.4 F (36.9 C), temperature source Oral, resp. rate 18, height 5\' 8"  (1.727 m), weight 116 lb 11.2 oz (52.935 kg), SpO2 96 %.  PHYSICAL EXAMINATION:  GENERAL:  75 y.o.-year-old patient lying in the bed with no acute distress.  EYES: Pupils equal, round, reactive to light and accommodation. No scleral icterus. Extraocular muscles intact.  HEENT: Head atraumatic, normocephalic. Oropharynx and nasopharynx clear.  NECK:  Supple, no jugular venous distention. No thyroid enlargement, no tenderness.  LUNGS: Normal breath sounds bilaterally, no wheezing, rales,rhonchi or crepitation. No use of accessory muscles of respiration.  CARDIOVASCULAR: S1, S2 normal. No murmurs, rubs, or gallops.  ABDOMEN: Soft, nontender, nondistended. Bowel sounds present. No organomegaly or mass.   EXTREMITIES: No pedal edema, cyanosis, or clubbing. Bilateral leg in dressing.  NEUROLOGIC: Cranial nerves II through XII are intact. Muscle strength 1-2/5 in all extremities. Sensation intact. Gait not checked.  PSYCHIATRIC: The patient is alert and oriented x 3.  SKIN: No obvious rash, lesion, or ulcer.    LABORATORY PANEL:   CBC  Recent Labs Lab 11/11/15 2210 11/12/15 1145  WBC 4.4  --   HGB 8.0* 8.0*  HCT 23.6*  --   PLT 306  --    ------------------------------------------------------------------------------------------------------------------  Chemistries   Recent Labs Lab 11/11/15 2210 11/12/15 1145  NA 137 138  K 2.8* 4.0  CL 97* 101  CO2 26 29  GLUCOSE 92 95  BUN 29* 26*  CREATININE 0.98 0.87  CALCIUM 8.2* 8.3*  MG  --  1.7  AST 32  --   ALT 12*  --   ALKPHOS 68  --   BILITOT 1.1  --    ------------------------------------------------------------------------------------------------------------------  Cardiac Enzymes  Recent Labs Lab 11/12/15 0850  TROPONINI 0.16*   ------------------------------------------------------------------------------------------------------------------  RADIOLOGY:  Ct Angio Chest Pe W/cm &/or Wo Cm  11/11/2015  CLINICAL DATA:  Shortness of breath. Decreased oxygen saturations. Low blood pressure. Right hip surgery this past Monday. EXAM: CT ANGIOGRAPHY CHEST WITH CONTRAST TECHNIQUE: Multidetector CT imaging of the chest was performed using the standard protocol during bolus administration of intravenous contrast. Multiplanar CT image reconstructions and MIPs were obtained to evaluate the vascular anatomy. CONTRAST:  60 mL Isovue 370 COMPARISON:  None. FINDINGS: Technically adequate study with good opacification of the central and segmental pulmonary arteries. No focal filling defects are demonstrated. No evidence of significant pulmonary embolus. Normal heart size.  Ectatic ascending thoracic aorta, measuring 3.4 cm diameter.  Aortic calcification. No aortic dissection. Great vessel origins are patent. Coronary artery calcifications. Esophagus is decompressed. No significant lymphadenopathy in the chest. Moderate bilateral pleural effusions with basilar atelectasis bilaterally. Filling defect in the trachea probably represents mucoid secretions. Airways are otherwise patent. No pneumothorax. Emphysematous changes in the upper lungs. Included portions of the upper abdominal organs are grossly unremarkable. Heterogeneous appearance of bone marrow probably representing osteoporosis or metabolic bone changes. No focal lesions identified. Degenerative changes in the spine. Review of the MIP images confirms the above findings. IMPRESSION: No evidence of significant pulmonary embolus. Filling defect in the trachea probably representing mucus. Bilateral pleural effusions with basilar atelectasis. Diffuse pulmonary emphysema. Electronically Signed   By: Lucienne Capers M.D.   On: 11/11/2015 23:59    EKG:   Orders placed or performed during the hospital encounter of 11/11/15  . ED EKG  . ED EKG    ASSESSMENT AND PLAN:   1.Elevated troponin possible due to demanding ischemia. The patient was treated with therapeutic Lovenox. Discontinue Lovenox and continue medical treatment, no further cardiac workup per Dr. Clayborn Bigness.  2. Essential hypertension: Continue Lasix, lisinopril and amlodipine  3. COPD with emphysema: Stable.  CTA of the chest ruled out pulmonary embolism. Continue nebulizer when necessary.  4. History of CHF. An clear diastolic or systolic. But stable, will get echocardiograph. Continue Lasix, lisinopril and atenolol.  5. Anemia of chronic disease. Hemoglobin is stable.   * Hypokalemia. Improved with potassium supplement.  All the records are reviewed and case discussed with Care Management/Social Workerr. Management plans discussed with the patient, her daughter-in-law and they are in agreement.  CODE  STATUS: Full code  TOTAL TIME TAKING CARE OF THIS PATIENT: 36 minutes.  Greater than 50% time was spent on coordination of care and face-to-face counseling.  POSSIBLE D/C IN 2 DAYS, DEPENDING ON CLINICAL CONDITION.   Demetrios Loll M.D on 11/12/2015 at 3:26 PM  Between 7am to 6pm - Pager - [phone]  After 6pm go to [email or web address] - password [email or web address] [facility]  Office  [phone]  CC: Primary care physician; Kirk Ruths., MD

## 2015-11-12 NOTE — Progress Notes (Signed)
Patient is admitted to room 243 with the diagnosis of NSTEMI. Alert and oriented x 4. Medicated her with Morphine 2 mg IV for c/o right hip fracture on a scale of 8/10 on  a pain scale. Tele box called to CCMD and was verified by a second person. Skin assessment done with Tiffany M., noted wound to the sacrum, and bilateral legs. Wound consult in.  Patient was oriented to her room, call bell/ascom and staff. Bed alarm activated and the bed is in the lowest position. Will continue to monitor.

## 2015-11-12 NOTE — ED Provider Notes (Signed)
-----------------------------------------   12:18 AM on 11/12/2015 -----------------------------------------  CTA chest interpreted per Dr. Gerilyn Nestle: No evidence of significant pulmonary embolus. Filling defect in the trachea probably representing mucus. Bilateral pleural effusions with basilar atelectasis. Diffuse pulmonary emphysema.  Discussed with hospitalist evaluate patient in the emergency department. Will ask respiratory therapy to perform deep nasal tracheal suction for mucous plug.  Paulette Blanch, MD 11/12/15 [phone]

## 2015-11-13 ENCOUNTER — Encounter
Admission: RE | Admit: 2015-11-13 | Discharge: 2015-11-13 | Disposition: A | Discharge status: Home / Self Care | Payer: Commercial Managed Care - HMO | Source: Ambulatory Visit | Attending: Internal Medicine | Admitting: Internal Medicine

## 2015-11-13 DIAGNOSIS — R32 Unspecified urinary incontinence: Secondary | ICD-10-CM | POA: Insufficient documentation

## 2015-11-13 LAB — BASIC METABOLIC PANEL
Anion gap: 4 — ABNORMAL LOW (ref 5–15)
BUN: 23 mg/dL — ABNORMAL HIGH (ref 6–20)
CHLORIDE: 105 mmol/L (ref 101–111)
CO2: 32 mmol/L (ref 22–32)
CREATININE: 0.74 mg/dL (ref 0.44–1.00)
Calcium: 8.6 mg/dL — ABNORMAL LOW (ref 8.9–10.3)
GFR calc non Af Amer: >60 mL/min (ref >60)
Glucose, Bld: 108 mg/dL — ABNORMAL HIGH (ref 65–99)
POTASSIUM: 4.3 mmol/L (ref 3.5–5.1)
SODIUM: 141 mmol/L (ref 135–145)

## 2015-11-13 NOTE — Discharge Instructions (Signed)
Heart healthy diet. °Activity as tolerated. °

## 2015-11-13 NOTE — Progress Notes (Addendum)
Clinical Social Worker informed that patient will be medically ready to discharge to [place]. Patient in a agreement with plan. CSW called Maudie Mercury- Admissions Coordinator at [facility] to confirm that patient's bed is ready. Provided patient's room number 209 and number to call for report [phone] . All discharge information faxed to [facility] via [place]. Call to patient's son- Corene Cornea, left message to inform her patient would discharge to [facility]. RN will call report and patient will discharge to [facility] via EMS.  Ernest Pine, MSW, [place] Social Work Department [phone]

## 2015-11-13 NOTE — Progress Notes (Signed)
Physical Therapy Evaluation Patient Details Name: Jacqueline Boyd MRN: [id number] DOB: 11-06-1940 Today's Date: 11/13/2015   History of Present Illness  Pt is a 75 yr old female who underwent R THA at [facility] last week, and was d/c'ed 11/11/15 to STR where after a few hours was found to have SOB and O2 sats at 85% on room air. She was transported to the ED and admitted for hypokalemia and elevated troponin levels possibly due to demanding ischemia. PMH significant for CHF, HTN, COPD, anemia, anxiety, and chronic leg pain.     Clinical Impression  Pt is currently rehabilitating from R THA 11/07/15. She reports that she is WBAT and with posterior hip precatuions (able to recall 2/3 with cueing). Prior to that surgery, pt was mod I with single point cane for basic ADLs and household ambulation. Pt lives in an one-level apartment style add-on to her son's home, requiring 5 steps for entry. Currently pt is max A x2 for bed mobility sit <> supine. She requires fluctuating assist to maintain balance sitting EOB from min-max A. She endorses 9-10/10 bilat leg pain, more wound related than surgical. 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 Pt would benefit from skilled PT to address noted impairments and functional limitations.  Recommend pt discharge back to STR when medically appropriate.     Follow Up Recommendations SNF    Equipment Recommendations  Other (will further assess with OOB activity)    Recommendations for Other Services       Precautions / Restrictions Precautions Precautions: Posterior Hip;Fall Restrictions Weight Bearing Restrictions: Yes Other Position/Activity Restrictions: WBAT RLE  Comment: precautions/weight bearing restrictions per pt report    Mobility  Bed Mobility Overal bed mobility: Needs Assistance;+2 for physical assistance Bed Mobility: Supine  to Sit;Sit to Supine  Supine to sit: Max assist;+2 for physical assistance;HOB elevated Sit to supine: Max assist;+2 for physical assistance;HOB elevated   General bed mobility comments: Pt requires max A x2 at bilat LEs and trunk for supine <> sit and a scoot up in bed. Verbal and tactile cues for technique and posterior THA precautions (pt able to recall 2/3 precautions with verbal cueing). Requires fluctuating assist levels min-max A to maintain balance sitting EOB x 71mins. Participation limited d/t 10/10 bilat LE pain.  Transfers  General transfer comment: Did not attempt 2/2 bilat LE pain, safety concerns.  Ambulation/Gait  General Gait Details: Did not attempt 2/2 bilat LE pain, safety concerns.  Stairs   Wheelchair Mobility   Modified Rankin (Stroke Patients Only)     Balance Overall balance assessment: Needs assistance Sitting-balance support: Bilateral upper extremity supported;Feet supported Sitting balance-Leahy Scale: Poor Sitting balance - Comments: Pt requires fluctuating assist min-max A for correction of posterior lean sitting EOB. Postural control: Posterior lean      Pertinent Vitals/Pain Pain Assessment: 0-10 Pain Score: 10-Worst pain ever (Varable 9-10/10 throughout session) Pain Location: Bilateral legs, most often R LE wound Pain Intervention(s): Limited activity within patient's tolerance;Monitored during session;Repositioned (RN aware of pain levels; will medicate when able)  HR maintained 89-91 bpm, O2 sats 94% on 2L 02 by nasal cannula    Home Living Family/patient expects to be discharged to:: Skilled nursing facility Living Arrangements: Children Type of Home: House Home Access: Stairs to enter Entrance Stairs-Rails: Right Entrance Stairs-Number of Steps: 5 Home Layout: One level Home Equipment: Cane - single point  Additional Comments: Pt lives in an apartment style add-on to her son's home, but plans to d/c back to STR    Prior Function Level  of Independence: Independent with assistive device(s)  Comments: Uses single point cane at baseline, able to perform basic ADLs and household ambulation but otherwise does not move around much        Extremity/Trunk Assessment   Upper Extremity Assessment: Overall WFL for tasks assessed   Lower Extremity Assessment: Generalized weakness (bilateral LE's at least 2+/5; unable to assess secondary to 10/10 pain with any pressure to legs)      Communication   Communication: No difficulties  Cognition Arousal/Alertness: Awake/alert Behavior During Therapy: WFL for tasks assessed/performed Overall Cognitive Status: Within Functional Limits for tasks assessed     General Comments General comments (skin integrity, edema, etc.): 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 Bandaging at R calf and L thigh which she attributes to this diagnosis. Ecchymosis present at LEs bilaterally.   Pt resting supine upon room entry, agreeable to PT eval though questions ability to participate d/t 9/10 bilat LE pain. RN aware of pain levels, but pt is not due for pain meds at this time.    Exercises Total Joint Exercises Ankle Circles/Pumps: AROM;Strengthening;Both;10 reps;Supine Quad Sets: AROM;Strengthening;Both;10 reps;Supine Gluteal Sets: AROM;Strengthening;Both;10 reps;Supine Short Arc Quad: AAROM;Strengthening;Both;10 reps;Supine Heel Slides: AAROM;Strengthening;Both;10 reps;Supine (Range limited bilat by pain)      Assessment/Plan    PT Assessment Patient needs continued PT services  PT Diagnosis Acute pain;Generalized weakness;Difficulty walking   PT Problem List Decreased strength;Decreased activity tolerance;Decreased balance;Decreased mobility;Decreased knowledge of use of DME;Decreased knowledge of precautions  PT Treatment Interventions DME instruction;Gait training;Stair training;Functional mobility training;Therapeutic activities;Therapeutic  exercise;Balance training;Patient/family education   PT Goals (Current goals can be found in the Care Plan section) Acute Rehab PT Goals Patient Stated Goal: To decrease pain PT Goal Formulation: With patient Time For Goal Achievement: 11/27/15 Potential to Achieve Goals: Fair    Frequency 7X/week   Barriers to discharge Inaccessible home environment, significantly decreased mobility 2/2 pain         End of Session Equipment Utilized During Treatment: Oxygen (2L O2 per nasal cannula) Activity Tolerance: Patient limited by pain Patient left: in bed;with call bell/phone within reach;with bed alarm set; pillow placed between pt's knees to prevent adduction/int rotation; bilat heels elevated on pillows Nurse Communication: Mobility status;Precautions;Weight bearing status (via whiteboard)         [id number] PT Time Calculation (min) (ACUTE ONLY): 48 min   Charges:         PT G Codes:        Aalyssa Elderkin, SPT 11/13/2015, 11:59 AM

## 2015-11-13 NOTE — Discharge Summary (Signed)
[place] [facility] [place] NAME: Jacqueline Boyd    MR#:  [id number]  DATE OF BIRTH:  [birth year]  DATE OF ADMISSION:  11/11/2015 ADMITTING PHYSICIAN: Harrie Foreman, MD  DATE OF DISCHARGE: 11/13/2015 PRIMARY CARE PHYSICIAN: Kirk Ruths., MD    ADMISSION DIAGNOSIS:  Shortness of breath [R06.02] Hypoxia [R09.02] Elevated troponin [R79.89]   DISCHARGE DIAGNOSIS:   Elevated troponin possible due to demanding ischemia. Hypokalemia SECONDARY DIAGNOSIS:   Past Medical History  Diagnosis Date  . CHF (congestive heart failure) ([place])   . Hypertension   . COPD with emphysema [facility])     HOSPITAL COURSE:   1.Elevated troponin possible due to demanding ischemia. The patient was treated with therapeutic Lovenox. Discontinue Lovenox and continue medical treatment, no further cardiac workup per Dr. Clayborn Bigness.  2. Essential hypertension: Continue Lasix, lisinopril and amlodipine  3. COPD with emphysema: Stable. CTA of the chest ruled out pulmonary embolism. Continue nebulizer when necessary.  4. History of CHF. unclear diastolic or systolic, but stable,  Follow up echocardiograph. Continue Lasix, lisinopril and atenolol.  5. Anemia of chronic disease. Hemoglobin is stable.   * Hypokalemia. Improved with potassium supplement.  DISCHARGE CONDITIONS:   Stable, discharge to SNF today.  CONSULTS OBTAINED:  Treatment Team:  Yolonda Kida, MD  DRUG ALLERGIES:   Allergies  Allergen Reactions  . Codeine Other (See Comments)    Itching and "feels like worms crawling in body".  . Prednisone Palpitations    DISCHARGE MEDICATIONS:   Current Discharge Medication List    CONTINUE these medications which have NOT CHANGED   Details  acetaminophen (TYLENOL) 500 MG tablet Take 1,000 mg by mouth every 8 (eight) hours as needed.    albuterol (PROVENTIL HFA;VENTOLIN HFA) 108 (90 Base) MCG/ACT inhaler Inhale 1-2 puffs into the lungs  every 6 (six) hours as needed for wheezing or shortness of breath.    amLODipine (NORVASC) 10 MG tablet Take 10 mg by mouth daily.    aspirin EC 325 MG tablet Take 325 mg by mouth daily.    atenolol (TENORMIN) 50 MG tablet Take 50 mg by mouth daily.    B Complex-C (B-COMPLEX WITH VITAMIN C) tablet Take 1 tablet by mouth daily.    docusate sodium (COLACE) 100 MG capsule Take 100 mg by mouth 2 (two) times daily as needed for mild constipation.    furosemide (LASIX) 40 MG tablet Take 40 mg by mouth 2 (two) times daily.    lisinopril (PRINIVIL,ZESTRIL) 20 MG tablet Take 20 mg by mouth daily.    magnesium hydroxide (MILK OF MAGNESIA) 400 MG/5ML suspension Take 30 mLs by mouth every 4 (four) hours as needed for mild constipation.    Multiple Vitamin (MULTIVITAMIN WITH MINERALS) TABS tablet Take 1 tablet by mouth daily.    ondansetron (ZOFRAN) 4 MG tablet Take 4 mg by mouth every 6 (six) hours as needed for nausea or vomiting.    oxyCODONE (OXY IR/ROXICODONE) 5 MG immediate release tablet Take 5-10 mg by mouth every 4 (four) hours as needed for moderate pain or severe pain.         DISCHARGE INSTRUCTIONS:    If you experience worsening of your admission symptoms, develop shortness of breath, life threatening emergency, suicidal or homicidal thoughts you must seek medical attention immediately by calling 911 or calling your MD immediately  if symptoms less severe.  You Must read complete instructions/literature along with all the possible adverse reactions/side effects for all  the Medicines you take and that have been prescribed to you. Take any new Medicines after you have completely understood and accept all the possible adverse reactions/side effects.   Please note  You were cared for by a hospitalist during your hospital stay. If you have any questions about your discharge medications or the care you received while you were in the hospital after you are discharged, you can call the  unit and asked to speak with the hospitalist on call if the hospitalist that took care of you is not available. Once you are discharged, your primary care physician will handle any further medical issues. Please note that NO REFILLS for any discharge medications will be authorized once you are discharged, as it is imperative that you return to your primary care physician (or establish a relationship with a primary care physician if you do not have one) for your aftercare needs so that they can reassess your need for medications and monitor your lab values.    Today   SUBJECTIVE    Leg pain  VITAL SIGNS:  Blood pressure 147/60, pulse 86, temperature 97.6 F (36.4 C), temperature source Oral, resp. rate 18, height 5\' 8"  (1.727 m), weight 116 lb 4.8 oz (52.753 kg), SpO2 96 %.  I/O:   Intake/Output Summary (Last 24 hours) at 11/13/15 0903 Last data filed at 11/13/15 0700  Gross per 24 hour  Intake      0 ml  Output   1200 ml  Net  -1200 ml    PHYSICAL EXAMINATION:  GENERAL:  75 y.o.-year-old patient lying in the bed with no acute distress. Thin. EYES: Pupils equal, round, reactive to light and accommodation. No scleral icterus. Extraocular muscles intact.  HEENT: Head atraumatic, normocephalic. Oropharynx and nasopharynx clear.  NECK:  Supple, no jugular venous distention. No thyroid enlargement, no tenderness.  LUNGS: Normal breath sounds bilaterally, no wheezing, rales,rhonchi or crepitation. No use of accessory muscles of respiration.  CARDIOVASCULAR: S1, S2 normal. No murmurs, rubs, or gallops.  ABDOMEN: Soft, non-tender, non-distended. Bowel sounds present. No organomegaly or mass.  EXTREMITIES: No pedal edema, cyanosis, or clubbing. Leg in dressing. NEUROLOGIC: Cranial nerves II through XII are intact. Muscle strength 3/5 in all extremities. Sensation intact. Gait not checked.  PSYCHIATRIC: The patient is alert and oriented x 3.  SKIN: No obvious rash, lesion, or ulcer.   DATA  REVIEW:   CBC  Recent Labs Lab 11/11/15 2210 11/12/15 1145  WBC 4.4  --   HGB 8.0* 8.0*  HCT 23.6*  --   PLT 306  --     Chemistries   Recent Labs Lab 11/11/15 2210 11/12/15 1145 11/13/15 0457  NA 137 138 141  K 2.8* 4.0 4.3  CL 97* 101 105  CO2 26 29 32  GLUCOSE 92 95 108*  BUN 29* 26* 23*  CREATININE 0.98 0.87 0.74  CALCIUM 8.2* 8.3* 8.6*  MG  --  1.7  --   AST 32  --   --   ALT 12*  --   --   ALKPHOS 68  --   --   BILITOT 1.1  --   --     Cardiac Enzymes  Recent Labs Lab 11/12/15 0850  TROPONINI 0.16*    Microbiology Results  Results for orders placed or performed during the hospital encounter of 11/11/15  Surgical pcr screen     Status: None   Collection Time: 11/12/15  4:30 AM  Result Value Ref Range Status  MRSA, PCR NEGATIVE NEGATIVE Final   Staphylococcus aureus NEGATIVE NEGATIVE Final    Comment:        The Xpert SA Assay (FDA approved for NASAL specimens in patients over 104 years of age), is one component of a comprehensive surveillance program.  Test performance has been validated by [facility] for patients greater than or equal to 10 year old. It is not intended to diagnose infection nor to guide or monitor treatment.     RADIOLOGY:  Ct Angio Chest Pe W/cm &/or Wo Cm  11/11/2015  CLINICAL DATA:  Shortness of breath. Decreased oxygen saturations. Low blood pressure. Right hip surgery this past Monday. EXAM: CT ANGIOGRAPHY CHEST WITH CONTRAST TECHNIQUE: Multidetector CT imaging of the chest was performed using the standard protocol during bolus administration of intravenous contrast. Multiplanar CT image reconstructions and MIPs were obtained to evaluate the vascular anatomy. CONTRAST:  60 mL Isovue 370 COMPARISON:  None. FINDINGS: Technically adequate study with good opacification of the central and segmental pulmonary arteries. No focal filling defects are demonstrated. No evidence of significant pulmonary embolus. Normal heart size.  Ectatic ascending thoracic aorta, measuring 3.4 cm diameter. Aortic calcification. No aortic dissection. Great vessel origins are patent. Coronary artery calcifications. Esophagus is decompressed. No significant lymphadenopathy in the chest. Moderate bilateral pleural effusions with basilar atelectasis bilaterally. Filling defect in the trachea probably represents mucoid secretions. Airways are otherwise patent. No pneumothorax. Emphysematous changes in the upper lungs. Included portions of the upper abdominal organs are grossly unremarkable. Heterogeneous appearance of bone marrow probably representing osteoporosis or metabolic bone changes. No focal lesions identified. Degenerative changes in the spine. Review of the MIP images confirms the above findings. IMPRESSION: No evidence of significant pulmonary embolus. Filling defect in the trachea probably representing mucus. Bilateral pleural effusions with basilar atelectasis. Diffuse pulmonary emphysema. Electronically Signed   By: Lucienne Capers M.D.   On: 11/11/2015 23:59        Management plans discussed with the patient, family and they are in agreement.  CODE STATUS:     Code Status Orders        Start     Ordered   11/12/15 0307  Full code   Continuous     11/12/15 0318    Code Status History    Date Active Date Inactive Code Status Order ID Comments User Context   This patient has a current code status but no historical code status.      TOTAL TIME TAKING CARE OF THIS PATIENT: 33 minutes.    Demetrios Loll M.D on 11/13/2015 at 9:03 AM  Between 7am to 6pm - Pager - [phone]  After 6pm go to [email or web address] - password [email or web address] [facility]  Office  [phone]  CC: Primary care physician; Kirk Ruths., MD

## 2015-11-13 NOTE — Clinical Social Work Note (Signed)
Clinical Social Work Assessment  Patient Details  Name: Jacqueline Boyd MRN: [id number] Date of Birth: 05-16-1941  Date of referral:  11/13/15               Reason for consult:  Discharge Planning                Permission sought to share information with:  Family Supports Permission granted to share information::     Name::        Agency::     Relationship::   Jacqueline Boyd- Son)  Sport and exercise psychologist Information:     Housing/Transportation Living arrangements for the past 2 months:  [place] of Information:  Patient Patient Interpreter Needed:  None Criminal Activity/Legal Involvement Pertinent to Current Situation/Hospitalization:  No - Comment as needed Significant Relationships:  Adult Children Lives with:  Facility Resident Do you feel safe going back to the place where you live?  Yes Need for family participation in patient care:  No (Coment)  Care giving concerns:  Patient is from [facility].    Social Worker assessment / plan:  CSW met with patient at bedside. Patient reports that she is from [facility]. Stated she'd like to return there at discharge. Granted CSW verbal permission to speak to her SonCorene Boyd if needed and to coordinate discharge plans with [facility] CSW contacted Maudie Mercury- Development worker, international aid at [place]. She reported she's able to accept patient back today. Contacted Amy with [employer] to obtain insurance Auth. FL2/ PASRR completed and faxed to [facility]. CSW will continue to follow and assist.   Employment status:  Retired Nurse, adult PT Recommendations:    Information / Referral to community resources:     Patient/Family's Response to care:  Patient is in agreement with returning to [facility] today.   Patient/Family's Understanding of and Emotional Response to Diagnosis, Current Treatment, and Prognosis:  Patient is appreciative of CSW assistance   Emotional Assessment Appearance:  Appears stated age Attitude/Demeanor/Rapport:    (None) Affect (typically observed):  Calm, Pleasant, Accepting Orientation:  Oriented to Place, Oriented to  Time, Oriented to Self, Oriented to Situation Alcohol / Substance use:  Not Applicable Psych involvement (Current and /or in the community):  No (Comment)  Discharge Needs  Concerns to be addressed:  Discharge Planning Concerns Readmission within the last 30 days:  No Current discharge risk:  None Barriers to Discharge:  No Barriers Identified   [place], LCSW 11/13/2015, 2:19 PM

## 2015-11-13 NOTE — Consult Note (Signed)
WOC wound consult note Reason for Consult:Skin tears to right lower leg from wheelchair transport and left upper thigh from pet scratch.  Wound type:Trauma  Skin tears.  Pressure Ulcer POA: Yes  Sacrum  Silicone border foam dressing in place  Stage 2 pressure injury Measurement: left thigh 3 cm x 3 cm x 0.2 cm skin tear with nonapproximated wound edges.  Right lateral lower leg  3 cm x 2 cm x 0.1 cm skin tear  Edges not approximated Right medial leg 8 cm x 3 cm 0.2 cm with tendon visible.  Wound [id number] red Drainage (amount, consistency, odor) Moderate serosanguinous  No odor Periwound:Erythema, ecchymosis  Pain with dressing change  Has been medicated.  Dressing procedure/placement/frequency:Premedicate for pain: Cleanse left upper thigh and right lower leg with soap and water and pat gently dry.  Apply <Mepitel silicone contact layer to wound bed for atraumatic dressing removal.  Cover with 4x4 gauze and kerlix.  Change MOn/Wed/Fri.   Will not follow at this time.  Please re-consult if needed.  Domenic Moras RN BSN CWON Pager [phone] :

## 2015-11-13 NOTE — Progress Notes (Signed)
Patient is being transfer to SNF in a stable condition report given to floor nurse , pick up by EMS

## 2015-11-13 NOTE — Progress Notes (Signed)
[employer] [facility] auth # [id number].  Ernest Pine, MSW, [place] Social Work Department [phone]

## 2015-11-13 NOTE — Care Management (Signed)
Patient to discharge to [facility] today.

## 2015-11-13 NOTE — Care Management Important Message (Signed)
Important Message  Patient Details  Name: Jacqueline Boyd MRN: [id number] Date of Birth: [birth year]   Medicare Important Message Given:  Yes    Jolly Mango, RN 11/13/2015, 8:43 AM

## 2015-11-20 DIAGNOSIS — R32 Unspecified urinary incontinence: Secondary | ICD-10-CM | POA: Diagnosis not present

## 2015-11-20 LAB — URINALYSIS COMPLETE WITH MICROSCOPIC (ARMC ONLY)
BACTERIA UA: NONE SEEN
BILIRUBIN URINE: NEGATIVE
GLUCOSE, UA: NEGATIVE mg/dL
HGB URINE DIPSTICK: NEGATIVE
Ketones, ur: NEGATIVE mg/dL
Leukocytes, UA: NEGATIVE
Nitrite: NEGATIVE
Protein, ur: NEGATIVE mg/dL
RBC / HPF: NONE SEEN RBC/hpf (ref 0–5)
Specific Gravity, Urine: 1.01 (ref 1.005–1.030)
Squamous Epithelial / HPF: NONE SEEN
WBC UA: NONE SEEN WBC/hpf (ref 0–5)
pH: 6 (ref 5.0–8.0)

## 2015-11-22 LAB — URINE CULTURE: Culture: 2000 — AB

## 2015-11-24 ENCOUNTER — Non-Acute Institutional Stay (SKILLED_NURSING_FACILITY): Payer: Commercial Managed Care - HMO | Admitting: Gerontology

## 2015-11-24 DIAGNOSIS — M109 Gout, unspecified: Secondary | ICD-10-CM

## 2015-11-24 DIAGNOSIS — J439 Emphysema, unspecified: Secondary | ICD-10-CM

## 2015-11-24 NOTE — Progress Notes (Signed)
[facility]:  [facility] Service:  SNF [phone]) Provider:  Toni Arthurs, NP-C  Kirk Ruths., MD  Patient Care Team: Kirk Ruths, MD as PCP - General (Internal Medicine) Rusty Aus, MD (Internal Medicine)  Extended Emergency Contact Information Primary Emergency Contact: Stacy,Jason Address: [address]          [place], [place] [address] Johnnette Litter of [place] Phone: [phone] Relation: Son Secondary Emergency Contact: Stacey,Jeffifer  [place] Phone: [phone] Relation: Other  Code Status:  Full Code Goals of care: Advanced Directive information Advanced Directives 11/11/2015  Does patient have an advance directive? No  Would patient like information on creating an advanced directive? No - patient declined information     Chief Complaint  Patient presents with  . Gout  . Shortness of Breath    HPI:  Pt is a 75 y.o. female seen today for an acute visit for sore finger. Pt has a h/o gout and RA. C/o severe pain that is inhibiting progression with PT, inhibits ability to grip walker. While interviewing pt, I observed occasional pursed-lipped breathing, occasional tachypnea. Use of oxygen/ inability to wean off O2-not on O2 at home, except for when she had pneumonia. C/o occasional dyspnea with exertion. VSS.     Past Medical History  Diagnosis Date  . CHF (congestive heart failure) ([place])   . Hypertension   . COPD with emphysema [facility])    Past Surgical History  Procedure Laterality Date  . Hip surgery Right   . 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 Codeine Other (See Comments)    Itching and "feels like worms crawling in body".  . Penicillins Rash  . Prednisone Palpitations      Medication List       This list is accurate as of: 11/24/15 12:11 AM.  Always use your most recent med list.               acetaminophen 500 MG tablet  Commonly known as:   TYLENOL  Take 1,000 mg by mouth every 8 (eight) hours as needed.     albuterol 108 (90 Base) MCG/ACT inhaler  Commonly known as:  PROVENTIL HFA;VENTOLIN HFA  Inhale 1-2 puffs into the lungs every 6 (six) hours as needed for wheezing or shortness of breath.     amLODipine 10 MG tablet  Commonly known as:  NORVASC  Take 10 mg by mouth daily.     aspirin EC 325 MG tablet  Take 325 mg by mouth daily.     atenolol 50 MG tablet  Commonly known as:  TENORMIN  Take 50 mg by mouth daily.     B-complex with vitamin C tablet  Take 1 tablet by mouth daily.     docusate sodium 100 MG capsule  Commonly known as:  COLACE  Take 100 mg by mouth 2 (two) times daily as needed for mild constipation.     furosemide 40 MG tablet  Commonly known as:  LASIX  Take 40 mg by mouth 2 (two) times daily.     lisinopril 20 MG tablet  Commonly known as:  PRINIVIL,ZESTRIL  Take 20 mg by mouth daily.     magnesium hydroxide 400 MG/5ML suspension  Commonly known as:  MILK OF MAGNESIA  Take 30 mLs by mouth every 4 (four) hours as needed for mild constipation.  multivitamin with minerals Tabs tablet  Take 1 tablet by mouth daily.     ondansetron 4 MG tablet  Commonly known as:  ZOFRAN  Take 4 mg by mouth every 6 (six) hours as needed for nausea or vomiting.     oxyCODONE 5 MG immediate release tablet  Commonly known as:  Oxy IR/ROXICODONE  Take 5-10 mg by mouth every 4 (four) hours as needed for moderate pain or severe pain.        Review of Systems  Constitutional: Negative.   HENT: Negative.   Respiratory: Positive for chest tightness and shortness of breath.   Cardiovascular: Negative.   Gastrointestinal: Negative.   Genitourinary: Negative.   Musculoskeletal: Positive for joint swelling and arthralgias.  Skin: Negative.   Neurological: Negative.      There is no immunization history on file for this patient. There are no preventive care reminders to display for this patient. No  flowsheet data found. Functional Status Survey:    There were no vitals filed for this visit. There is no weight on file to calculate BMI. Physical Exam  Constitutional: She is oriented to person, place, and time. She appears well-developed and well-nourished. No distress.  Cardiovascular: Normal rate, regular rhythm, normal heart sounds and intact distal pulses.  Exam reveals no gallop and no friction rub.   No murmur heard. Pulmonary/Chest: Accessory muscle usage (pursed lipped breathing at times) present. She has decreased breath sounds in the right lower field and the left lower field. She has no wheezes. She has no rhonchi. She has no rales.  Neurological: She is alert and oriented to person, place, and time.  Skin: Skin is warm, dry and intact. There is erythema (warmth, tenderness). No cyanosis. Nails show no clubbing.       Labs reviewed:  Recent Labs  11/11/15 2210 11/12/15 1145 11/13/15 0457  NA 137 138 141  K 2.8* 4.0 4.3  CL 97* 101 105  CO2 26 29 32  GLUCOSE 92 95 108*  BUN 29* 26* 23*  CREATININE 0.98 0.87 0.74  CALCIUM 8.2* 8.3* 8.6*  MG  --  1.7  --     Recent Labs  11/11/15 2210  AST 32  ALT 12*  ALKPHOS 68  BILITOT 1.1  PROT 5.2*  ALBUMIN 2.6*    Recent Labs  11/11/15 2210 11/12/15 1145  WBC 4.4  --   NEUTROABS 3.2  --   HGB 8.0* 8.0*  HCT 23.6*  --   MCV 90.0  --   PLT 306  --    Lab Results  Component Value Date   TSH 3.629 11/12/2015   Lab Results  Component Value Date   HGBA1C 5.4 11/12/2015   No results found for: CHOL, HDL, LDLCALC, LDLDIRECT, TRIG, CHOLHDL  Significant Diagnostic Results in last 30 days:  Ct Angio Chest Pe W/cm &/or Wo Cm  11/11/2015  CLINICAL DATA:  Shortness of breath. Decreased oxygen saturations. Low blood pressure. Right hip surgery this past Monday. EXAM: CT ANGIOGRAPHY CHEST WITH CONTRAST TECHNIQUE: Multidetector CT imaging of the chest was performed using the standard protocol during bolus  administration of intravenous contrast. Multiplanar CT image reconstructions and MIPs were obtained to evaluate the vascular anatomy. CONTRAST:  60 mL Isovue 370 COMPARISON:  None. FINDINGS: Technically adequate study with good opacification of the central and segmental pulmonary arteries. No focal filling defects are demonstrated. No evidence of significant pulmonary embolus. Normal heart size. Ectatic ascending thoracic aorta, measuring 3.4 cm diameter. Aortic  calcification. No aortic dissection. Great vessel origins are patent. Coronary artery calcifications. Esophagus is decompressed. No significant lymphadenopathy in the chest. Moderate bilateral pleural effusions with basilar atelectasis bilaterally. Filling defect in the trachea probably represents mucoid secretions. Airways are otherwise patent. No pneumothorax. Emphysematous changes in the upper lungs. Included portions of the upper abdominal organs are grossly unremarkable. Heterogeneous appearance of bone marrow probably representing osteoporosis or metabolic bone changes. No focal lesions identified. Degenerative changes in the spine. Review of the MIP images confirms the above findings. IMPRESSION: No evidence of significant pulmonary embolus. Filling defect in the trachea probably representing mucus. Bilateral pleural effusions with basilar atelectasis. Diffuse pulmonary emphysema. Electronically Signed   By: Lucienne Capers M.D.   On: 11/11/2015 23:59    Assessment/Plan 1. Pulmonary emphysema, unspecified emphysema type ([place])  Breo Elipta- 1 inhalation daily. Monitor closely for allergic reaction d/t intolerance to prednisone. Pt can take Dexamethasone  Wein O2 to keep sats > 92%  Proair inhaler Q 2 hours prn wheeze- already ordered  2. Acute gout, unspecified cause, unspecified site  Voltaren 1% gel- 2 grams to hands/ knuckles QID for pain/ gout  Colchicine 0.6 mg po BID x 5 days  Family/ staff Communication:   Total Time: 30  minutes  Documentation: 15 minutes  Face to Face: 15 minutes  Family/Phone:   Labs/tests ordered:  na  Vikki Ports, NP-C [facility] [address], [place] [address] Cell Phone (Mon-Fri 8am-5pm):  [phone] On Call:  [phone] & follow prompts after 5pm & weekends Office Phone:  [phone] Office Fax:  [phone]

## 2015-11-25 ENCOUNTER — Encounter
Admission: RE | Admit: 2015-11-25 | Discharge: 2015-11-25 | Disposition: A | Discharge status: Home / Self Care | Payer: Commercial Managed Care - HMO | Source: Ambulatory Visit | Attending: Internal Medicine | Admitting: Internal Medicine

## 2015-12-05 ENCOUNTER — Non-Acute Institutional Stay (SKILLED_NURSING_FACILITY): Payer: Commercial Managed Care - HMO | Admitting: Gerontology

## 2015-12-05 DIAGNOSIS — M1611 Unilateral primary osteoarthritis, right hip: Secondary | ICD-10-CM

## 2015-12-05 NOTE — Progress Notes (Signed)
Location:  [facility]:  SNF [phone])  Provider: Toni Arthurs, NP-C  PCP: Kirk Ruths., MD Patient Care Team: Kirk Ruths, MD as PCP - General (Internal Medicine) Rusty Aus, MD (Internal Medicine)  Extended Emergency Contact Information Primary Emergency Contact: Stacy,Jason Address: [address]          [place], [place] [address] Johnnette Litter of [place] Phone: [phone] Relation: Son Secondary Emergency Contact: Stacey,Jeffifer  [place] Phone: [phone] Relation: Other  Code Status: Full Goals of care:  Advanced Directive information Advanced Directives 11/11/2015  Does patient have an advance directive? No  Would patient like information on creating an advanced directive? No - patient declined information     Allergies  Allergen Reactions  . Codeine Other (See Comments)    Itching and "feels like worms crawling in body".  . Penicillins Rash  . Prednisone Palpitations    Chief Complaint  Patient presents with  . Discharge Note    HPI:  75 y.o. female at the facility for rehab following RTHA. She progressed well with therapy. She did develop gout in the left 4 th finger. This responded well to colchicine and voltaren gel. I also observed pt purse-lipped breathing with reports from nursing that they were unable to wean off oxygen. She is not on oxygen at home. Chest xray revealed Emphysema. She was started on [employer] and [employer] inhaler. Nursing was able to successfully wean the oxygen and maintain sats. Today, pt reports significant improvement in breathing. She reported her pain as mild. She is ready to discharge home today.    Past Medical History  Diagnosis Date  . CHF (congestive heart failure) ([place])   . Hypertension   . COPD with emphysema [facility])     Past Surgical History  Procedure Laterality Date  . Hip surgery Right   . 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 She does not have any smokeless tobacco history on file. She reports that she drinks alcohol. She reports that she does not use illicit drugs. Social History   Social History  . Marital Status: Divorced    Spouse Name: N/A  . Number of Children: N/A  . Years of Education: N/A   Occupational History  . Not on file.   Social History Main Topics  . Smoking status: Former Smoker    Quit date: 11/11/1990  . Smokeless tobacco: Not on file  . Alcohol Use: Yes  . Drug Use: No  . Sexual Activity: Not on file   Other Topics Concern  . Not on file   Social History Narrative  . No narrative on file   Functional Status Survey:    Allergies  Allergen Reactions  . Codeine Other (See Comments)    Itching and "feels like worms crawling in body".  . Penicillins Rash  . Prednisone Palpitations    Pertinent  Health Maintenance Due  Topic Date Due  . COLONOSCOPY  02/22/1990  . DEXA SCAN  02/22/2005  . PNA vac Low Risk Adult (1 of 2 - PCV13) 02/22/2005  . INFLUENZA VACCINE  12/26/2015    Medications:   Medication List       This list is accurate as of: 12/05/15 10:08 PM.  Always use your most recent med list.  acetaminophen 500 MG tablet  Commonly known as:  TYLENOL  Take 1,000 mg by mouth every 8 (eight) hours as needed.     albuterol 108 (90 Base) MCG/ACT inhaler  Commonly known as:  PROVENTIL HFA;VENTOLIN HFA  Inhale 1-2 puffs into the lungs every 6 (six) hours as needed for wheezing or shortness of breath.     amLODipine 10 MG tablet  Commonly known as:  NORVASC  Take 10 mg by mouth daily.     aspirin EC 325 MG tablet  Take 325 mg by mouth daily.     atenolol 50 MG tablet  Commonly known as:  TENORMIN  Take 50 mg by mouth daily.     B-complex with vitamin C tablet  Take 1 tablet by mouth daily.     docusate sodium 100 MG capsule  Commonly known as:  COLACE  Take 100 mg by mouth 2 (two) times daily as  needed for mild constipation.     furosemide 40 MG tablet  Commonly known as:  LASIX  Take 40 mg by mouth 2 (two) times daily.     lisinopril 20 MG tablet  Commonly known as:  PRINIVIL,ZESTRIL  Take 20 mg by mouth daily.     magnesium hydroxide 400 MG/5ML suspension  Commonly known as:  MILK OF MAGNESIA  Take 30 mLs by mouth every 4 (four) hours as needed for mild constipation.     multivitamin with minerals Tabs tablet  Take 1 tablet by mouth daily.     ondansetron 4 MG tablet  Commonly known as:  ZOFRAN  Take 4 mg by mouth every 6 (six) hours as needed for nausea or vomiting.     oxyCODONE 5 MG immediate release tablet  Commonly known as:  Oxy IR/ROXICODONE  Take 5-10 mg by mouth every 4 (four) hours as needed for moderate pain or severe pain.        Review of Systems  Constitutional: Negative.   HENT: Negative.   Respiratory: Negative for cough, chest tightness and shortness of breath.   Cardiovascular: Positive for leg swelling. Negative for chest pain.  Gastrointestinal: Negative.   Genitourinary: Negative.   Musculoskeletal: Positive for arthralgias.  Skin: Negative.   Neurological: Negative.   Psychiatric/Behavioral: Negative.   All other systems reviewed and are negative.   Filed Vitals:   12/05/15 2204  BP: 98/42  Pulse: 75  Temp: 97.9 F (36.6 C)  Resp: 18  SpO2: 96%   There is no weight on file to calculate BMI. Physical Exam  Constitutional: She is oriented to person, place, and time. Vital signs are normal. She appears well-developed and well-nourished. She is active and cooperative. She does not appear ill. No distress.  Eyes: Conjunctivae, EOM and lids are normal. Pupils are equal, round, and reactive to light.  Cardiovascular: Normal rate, regular rhythm, normal heart sounds, intact distal pulses and normal pulses.  Exam reveals no gallop, no distant heart sounds and no friction rub.   No murmur heard. Pulses:      Dorsalis pedis pulses are 2+  on the right side, and 2+ on the left side.  Right lower extreamety 3+ pitting edema- in the dependent position  Pulmonary/Chest: Effort normal and breath sounds normal. No accessory muscle usage. No respiratory distress. She has no wheezes. She has no rhonchi. She has no rales.  Abdominal: Soft. Normal appearance and bowel sounds are normal. There is no tenderness.  Musculoskeletal:       Right hip: She exhibits  decreased range of motion (RTHA) and decreased strength.  Neurological: She is alert and oriented to person, place, and time. She has normal strength. She is not disoriented.  Skin: Skin is warm, dry and intact. She is not diaphoretic. No cyanosis. No pallor. Nails show no clubbing.  Psychiatric: She has a normal mood and affect. Her speech is normal and behavior is normal. Judgment and thought content normal. Cognition and memory are normal.  Nursing note and vitals reviewed.   Labs reviewed: Basic Metabolic Panel:  Recent Labs  11/11/15 2210 11/12/15 1145 11/13/15 0457  NA 137 138 141  K 2.8* 4.0 4.3  CL 97* 101 105  CO2 26 29 32  GLUCOSE 92 95 108*  BUN 29* 26* 23*  CREATININE 0.98 0.87 0.74  CALCIUM 8.2* 8.3* 8.6*  MG  --  1.7  --    Liver Function Tests:  Recent Labs  11/11/15 2210  AST 32  ALT 12*  ALKPHOS 68  BILITOT 1.1  PROT 5.2*  ALBUMIN 2.6*   No results for input(s): LIPASE, AMYLASE in the last 8760 hours. No results for input(s): AMMONIA in the last 8760 hours. CBC:  Recent Labs  11/11/15 2210 11/12/15 1145  WBC 4.4  --   NEUTROABS 3.2  --   HGB 8.0* 8.0*  HCT 23.6*  --   MCV 90.0  --   PLT 306  --    Cardiac Enzymes:  Recent Labs  11/11/15 2210 11/12/15 0333 11/12/15 0850  TROPONINI 0.19* 0.18* 0.16*   BNP: Invalid input(s): POCBNP CBG: No results for input(s): GLUCAP in the last 8760 hours.  Procedures and Imaging Studies During Stay: Ct Angio Chest Pe W/cm &/or Wo Cm  11/11/2015  CLINICAL DATA:  Shortness of breath.  Decreased oxygen saturations. Low blood pressure. Right hip surgery this past Monday. EXAM: CT ANGIOGRAPHY CHEST WITH CONTRAST TECHNIQUE: Multidetector CT imaging of the chest was performed using the standard protocol during bolus administration of intravenous contrast. Multiplanar CT image reconstructions and MIPs were obtained to evaluate the vascular anatomy. CONTRAST:  60 mL Isovue 370 COMPARISON:  None. FINDINGS: Technically adequate study with good opacification of the central and segmental pulmonary arteries. No focal filling defects are demonstrated. No evidence of significant pulmonary embolus. Normal heart size. Ectatic ascending thoracic aorta, measuring 3.4 cm diameter. Aortic calcification. No aortic dissection. Great vessel origins are patent. Coronary artery calcifications. Esophagus is decompressed. No significant lymphadenopathy in the chest. Moderate bilateral pleural effusions with basilar atelectasis bilaterally. Filling defect in the trachea probably represents mucoid secretions. Airways are otherwise patent. No pneumothorax. Emphysematous changes in the upper lungs. Included portions of the upper abdominal organs are grossly unremarkable. Heterogeneous appearance of bone marrow probably representing osteoporosis or metabolic bone changes. No focal lesions identified. Degenerative changes in the spine. Review of the MIP images confirms the above findings. IMPRESSION: No evidence of significant pulmonary embolus. Filling defect in the trachea probably representing mucus. Bilateral pleural effusions with basilar atelectasis. Diffuse pulmonary emphysema. Electronically Signed   By: Lucienne Capers M.D.   On: 11/11/2015 23:59    Assessment/Plan:   1. Primary osteoarthritis of right hip  F/U with orthopedist asap  F/U with PCP asap  Continue Physical therapy at home with home health services  RX for Oxycodone 5 mg 1-2 tablets po Q 4 hrs prn pain #30    Patient is being discharged  with the following home health services:  [facility]/ OT/ RN  Patient is being discharged  with the following durable medical equipment:  none  Patient has been advised to f/u with their PCP in 1-2 weeks to bring them up to date on their rehab stay.  Social services at facility was responsible for arranging this appointment.  Pt was provided with a 30 day supply of prescriptions for medications and refills must be obtained from their PCP.  For controlled substances, a more limited supply may be provided adequate until PCP appointment only.  Future labs/tests needed:  none   Vikki Ports, NP-C [facility] [phone], [place] [address] Cell Phone (Mon-Fri 8am-5pm):  [phone] On Call:  [phone] & follow prompts after 5pm & weekends Office Phone:  [phone] Office Fax:  [phone]

## 2015-12-09 ENCOUNTER — Emergency Department: Payer: Commercial Managed Care - HMO

## 2015-12-09 ENCOUNTER — Inpatient Hospital Stay
Admission: EM | Admit: 2015-12-09 | Discharge: 2015-12-12 | DRG: 640 | Disposition: A | Discharge status: Home / Self Care | Payer: Commercial Managed Care - HMO | Attending: Internal Medicine | Admitting: Internal Medicine

## 2015-12-09 ENCOUNTER — Encounter: Payer: Self-pay | Admitting: Emergency Medicine

## 2015-12-09 DIAGNOSIS — M25552 Pain in left hip: Secondary | ICD-10-CM

## 2015-12-09 DIAGNOSIS — Z8249 Family history of ischemic heart disease and other diseases of the circulatory system: Secondary | ICD-10-CM | POA: Diagnosis not present

## 2015-12-09 DIAGNOSIS — Z7982 Long term (current) use of aspirin: Secondary | ICD-10-CM

## 2015-12-09 DIAGNOSIS — Z87891 Personal history of nicotine dependence: Secondary | ICD-10-CM

## 2015-12-09 DIAGNOSIS — F419 Anxiety disorder, unspecified: Secondary | ICD-10-CM | POA: Diagnosis present

## 2015-12-09 DIAGNOSIS — Z880 Allergy status to penicillin: Secondary | ICD-10-CM | POA: Diagnosis not present

## 2015-12-09 DIAGNOSIS — Z886 Allergy status to analgesic agent status: Secondary | ICD-10-CM | POA: Diagnosis not present

## 2015-12-09 DIAGNOSIS — I110 Hypertensive heart disease with heart failure: Secondary | ICD-10-CM | POA: Diagnosis present

## 2015-12-09 DIAGNOSIS — E871 Hypo-osmolality and hyponatremia: Principal | ICD-10-CM | POA: Diagnosis present

## 2015-12-09 DIAGNOSIS — J449 Chronic obstructive pulmonary disease, unspecified: Secondary | ICD-10-CM | POA: Diagnosis present

## 2015-12-09 DIAGNOSIS — L89323 Pressure ulcer of left buttock, stage 3: Secondary | ICD-10-CM | POA: Diagnosis present

## 2015-12-09 DIAGNOSIS — I5032 Chronic diastolic (congestive) heart failure: Secondary | ICD-10-CM | POA: Diagnosis present

## 2015-12-09 DIAGNOSIS — L89899 Pressure ulcer of other site, unspecified stage: Secondary | ICD-10-CM | POA: Diagnosis present

## 2015-12-09 DIAGNOSIS — L8990 Pressure ulcer of unspecified site, unspecified stage: Secondary | ICD-10-CM | POA: Insufficient documentation

## 2015-12-09 DIAGNOSIS — E43 Unspecified severe protein-calorie malnutrition: Secondary | ICD-10-CM | POA: Diagnosis present

## 2015-12-09 DIAGNOSIS — Z79899 Other long term (current) drug therapy: Secondary | ICD-10-CM

## 2015-12-09 DIAGNOSIS — Z681 Body mass index (BMI) 19 or less, adult: Secondary | ICD-10-CM

## 2015-12-09 LAB — COMPREHENSIVE METABOLIC PANEL
ALBUMIN: 3.3 g/dL — AB (ref 3.5–5.0)
ALT: 11 U/L — ABNORMAL LOW (ref 14–54)
AST: 24 U/L (ref 15–41)
Alkaline Phosphatase: 93 U/L (ref 38–126)
Anion gap: 11 (ref 5–15)
BUN: 40 mg/dL — AB (ref 6–20)
CHLORIDE: 81 mmol/L — AB (ref 101–111)
CO2: 28 mmol/L (ref 22–32)
Calcium: 8.3 mg/dL — ABNORMAL LOW (ref 8.9–10.3)
Creatinine, Ser: 0.86 mg/dL (ref 0.44–1.00)
GFR calc Af Amer: >60 mL/min (ref >60)
GFR calc non Af Amer: >60 mL/min (ref >60)
GLUCOSE: 113 mg/dL — AB (ref 65–99)
POTASSIUM: 4.8 mmol/L (ref 3.5–5.1)
Sodium: 120 mmol/L — ABNORMAL LOW (ref 135–145)
Total Bilirubin: 0.3 mg/dL (ref 0.3–1.2)
Total Protein: 6.8 g/dL (ref 6.5–8.1)

## 2015-12-09 LAB — CBC WITH DIFFERENTIAL/PLATELET
BASOS ABS: 0.1 10*3/uL (ref 0–0.1)
BASOS PCT: 1 %
EOS PCT: 1 %
Eosinophils Absolute: 0.1 10*3/uL (ref 0–0.7)
HEMATOCRIT: 25.5 % — AB (ref 35.0–47.0)
Hemoglobin: 8.6 g/dL — ABNORMAL LOW (ref 12.0–16.0)
Lymphocytes Relative: 11 %
Lymphs Abs: 1.2 10*3/uL (ref 1.0–3.6)
MCH: 29.2 pg (ref 26.0–34.0)
MCHC: 33.5 g/dL (ref 32.0–36.0)
MCV: 87.1 fL (ref 80.0–100.0)
MONO ABS: 0.7 10*3/uL (ref 0.2–0.9)
Monocytes Relative: 7 %
NEUTROS ABS: 8.1 10*3/uL — AB (ref 1.4–6.5)
Neutrophils Relative %: 80 %
PLATELETS: 507 10*3/uL — AB (ref 150–440)
RBC: 2.93 MIL/uL — AB (ref 3.80–5.20)
RDW: 14.7 % — AB (ref 11.5–14.5)
WBC: 10.1 10*3/uL (ref 3.6–11.0)

## 2015-12-09 LAB — TROPONIN I: Troponin I: <0.03 ng/mL (ref <0.03)

## 2015-12-09 LAB — URINALYSIS COMPLETE WITH MICROSCOPIC (ARMC ONLY)
BACTERIA UA: NONE SEEN
Bilirubin Urine: NEGATIVE
GLUCOSE, UA: NEGATIVE mg/dL
HGB URINE DIPSTICK: NEGATIVE
Ketones, ur: NEGATIVE mg/dL
NITRITE: NEGATIVE
PROTEIN: NEGATIVE mg/dL
SPECIFIC GRAVITY, URINE: 1.003 — AB (ref 1.005–1.030)
Trans Epithel, UA: <1
pH: 7 (ref 5.0–8.0)

## 2015-12-09 LAB — LIPASE, BLOOD: Lipase: 35 U/L (ref 11–51)

## 2015-12-09 LAB — TSH: TSH: 2.714 u[IU]/mL (ref 0.350–4.500)

## 2015-12-09 MED ORDER — ONDANSETRON HCL 4 MG/2ML IJ SOLN
4.0000 mg | Freq: Once | INTRAMUSCULAR | Status: AC
Start: 2015-12-09 — End: 2015-12-09
  Administered 2015-12-09: 4 mg via INTRAVENOUS
  Filled 2015-12-09: qty 2

## 2015-12-09 MED ORDER — B COMPLEX-C PO TABS
1.0000 | ORAL_TABLET | Freq: Every day | ORAL | Status: DC
  Administered 2015-12-10 – 2015-12-12 (×3): 1 via ORAL
  Filled 2015-12-09 (×4): qty 1

## 2015-12-09 MED ORDER — ATENOLOL 25 MG PO TABS
50.0000 mg | ORAL_TABLET | Freq: Every day | ORAL | Status: DC
  Administered 2015-12-10: 50 mg via ORAL
  Filled 2015-12-09: qty 2

## 2015-12-09 MED ORDER — ACETAMINOPHEN 650 MG RE SUPP: 650.0000 mg | Freq: Four times a day (QID) | RECTAL | Status: DC | PRN

## 2015-12-09 MED ORDER — ENOXAPARIN SODIUM 40 MG/0.4ML ~~LOC~~ SOLN
40.0000 mg | SUBCUTANEOUS | Status: DC
  Administered 2015-12-10 – 2015-12-11 (×2): 40 mg via SUBCUTANEOUS
  Filled 2015-12-09 (×2): qty 0.4

## 2015-12-09 MED ORDER — ALBUTEROL SULFATE (2.5 MG/3ML) 0.083% IN NEBU: 2.5000 mg | INHALATION_SOLUTION | Freq: Four times a day (QID) | RESPIRATORY_TRACT | Status: DC | PRN

## 2015-12-09 MED ORDER — MORPHINE SULFATE (PF) 2 MG/ML IV SOLN
2.0000 mg | INTRAVENOUS | Status: DC | PRN
  Administered 2015-12-09 – 2015-12-12 (×11): 2 mg via INTRAVENOUS
  Filled 2015-12-09 (×12): qty 1

## 2015-12-09 MED ORDER — LORAZEPAM 2 MG/ML IJ SOLN
1.0000 mg | Freq: Once | INTRAMUSCULAR | Status: AC
  Administered 2015-12-09: 1 mg via INTRAVENOUS
  Filled 2015-12-09: qty 1

## 2015-12-09 MED ORDER — MAGNESIUM HYDROXIDE 400 MG/5ML PO SUSP: 30.0000 mL | ORAL | Status: DC | PRN

## 2015-12-09 MED ORDER — ACETAMINOPHEN 325 MG PO TABS: 650.0000 mg | ORAL_TABLET | Freq: Four times a day (QID) | ORAL | Status: DC | PRN

## 2015-12-09 MED ORDER — CLINDAMYCIN HCL 150 MG PO CAPS
300.0000 mg | ORAL_CAPSULE | Freq: Three times a day (TID) | ORAL | Status: DC
  Administered 2015-12-09 – 2015-12-12 (×9): 300 mg via ORAL
  Filled 2015-12-09 (×9): qty 2

## 2015-12-09 MED ORDER — DOCUSATE SODIUM 100 MG PO CAPS: 100.0000 mg | ORAL_CAPSULE | Freq: Two times a day (BID) | ORAL | Status: DC | PRN

## 2015-12-09 MED ORDER — ONDANSETRON HCL 4 MG PO TABS: 4.0000 mg | ORAL_TABLET | Freq: Four times a day (QID) | ORAL | Status: DC | PRN

## 2015-12-09 MED ORDER — OXYCODONE HCL 5 MG PO TABS
5.0000 mg | ORAL_TABLET | ORAL | Status: DC | PRN
  Administered 2015-12-09 – 2015-12-10 (×4): 5 mg via ORAL
  Filled 2015-12-09 (×4): qty 1

## 2015-12-09 MED ORDER — ONDANSETRON HCL 4 MG/2ML IJ SOLN: 4.0000 mg | Freq: Four times a day (QID) | INTRAMUSCULAR | Status: DC | PRN

## 2015-12-09 MED ORDER — LISINOPRIL 20 MG PO TABS: 20.0000 mg | ORAL_TABLET | Freq: Every day | ORAL | Status: DC

## 2015-12-09 MED ORDER — AMLODIPINE BESYLATE 10 MG PO TABS: 10.0000 mg | ORAL_TABLET | Freq: Every day | ORAL | Status: DC

## 2015-12-09 MED ORDER — FENTANYL CITRATE (PF) 100 MCG/2ML IJ SOLN
50.0000 ug | Freq: Once | INTRAMUSCULAR | Status: AC
  Administered 2015-12-09: 50 ug via INTRAVENOUS
  Filled 2015-12-09: qty 2

## 2015-12-09 MED ORDER — ASPIRIN EC 325 MG PO TBEC
325.0000 mg | DELAYED_RELEASE_TABLET | Freq: Every day | ORAL | Status: DC
  Administered 2015-12-10 – 2015-12-12 (×3): 325 mg via ORAL
  Filled 2015-12-09 (×3): qty 1

## 2015-12-09 MED ORDER — ADULT MULTIVITAMIN W/MINERALS CH
1.0000 | ORAL_TABLET | Freq: Every day | ORAL | Status: DC
  Administered 2015-12-10 – 2015-12-12 (×3): 1 via ORAL
  Filled 2015-12-09 (×3): qty 1

## 2015-12-09 MED ORDER — SODIUM CHLORIDE 0.9 % IV SOLN
INTRAVENOUS | Status: DC
  Administered 2015-12-09 – 2015-12-10 (×2): via INTRAVENOUS

## 2015-12-09 MED ORDER — SODIUM CHLORIDE 0.9 % IV BOLUS (SEPSIS)
1000.0000 mL | Freq: Once | INTRAVENOUS | Status: AC
  Administered 2015-12-09: 1000 mL via INTRAVENOUS

## 2015-12-09 NOTE — H&P (Signed)
Jacqueline Boyd at [facility] NAME: Jacqueline Boyd    MR#:  [id number]  DATE OF BIRTH:  27-Feb-1941   DATE OF ADMISSION:  12/09/2015  PRIMARY CARE PHYSICIAN: Kirk Ruths., MD   REQUESTING/REFERRING PHYSICIAN: Scha[place]  CHIEF COMPLAINT:   Chief Complaint  Patient presents with  . Weakness    HISTORY OF PRESENT ILLNESS:  Jacqueline Boyd  is a 75 y.o. female with a known history of Diastolic congestive heart failure, essential hypertension who is presenting with generalized weakness. She states progressive weakness over the last 3-4 days, poor oral intake no fevers chills diarrhea nausea and vomiting chest pain. She does attests to having pain in her leg as well and sacrum patient is more she has known skin lesions. Per patient's right-sided leg as well as foul-smelling drainage  PAST MEDICAL HISTORY:   Past Medical History  Diagnosis Date  . CHF (congestive heart failure) ([place])   . Hypertension   . COPD with emphysema ([place])     PAST SURGICAL HISTORY:   Past Surgical History  Procedure Laterality Date  . Hip surgery Right   . 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 Smoking status: Former Smoker    Quit date: 11/11/1990  . Smokeless tobacco: Not on file  . Alcohol Use: Yes    FAMILY HISTORY:   Family History  Problem Relation Age of Onset  . Hypertension Other     DRUG ALLERGIES:   Allergies  Allergen Reactions  . Codeine Other (See Comments)    Itching and "feels like worms crawling in body".  . Penicillins Rash  . Prednisone Palpitations    REVIEW OF SYSTEMS:  REVIEW OF SYSTEMS:  CONSTITUTIONAL: Denies fevers, chills, Positive fatigue, weakness.  EYES: Denies blurred vision, double vision, or eye pain.  EARS, NOSE, THROAT: Denies tinnitus, ear pain, hearing loss.  RESPIRATORY: denies cough, shortness of breath, wheezing  CARDIOVASCULAR: Denies chest  pain, palpitations, edema.  GASTROINTESTINAL: Denies nausea, vomiting, diarrhea, abdominal pain.  GENITOURINARY: Denies dysuria, hematuria.  ENDOCRINE: Denies nocturia or thyroid problems. HEMATOLOGIC AND LYMPHATIC: Denies easy bruising or bleeding.  SKIN: Large ulcer right leg, sacral ulcer otherwise Denies rash or lesions.  MUSCULOSKELETAL: Denies pain in neck, back, shoulder, knees, hips, or further arthritic symptoms.  NEUROLOGIC: Denies paralysis, paresthesias.  PSYCHIATRIC: Denies anxiety or depressive symptoms. Otherwise full review of systems performed by me is negative.   MEDICATIONS AT HOME:   Prior to Admission medications   Medication Sig Start Date End Date Taking? Authorizing Provider  acetaminophen (TYLENOL) 500 MG tablet Take 1,000 mg by mouth every 8 (eight) hours as needed.    Historical Provider, MD  albuterol (PROVENTIL HFA;VENTOLIN HFA) 108 (90 Base) MCG/ACT inhaler Inhale 1-2 puffs into the lungs every 6 (six) hours as needed for wheezing or shortness of breath.    Historical Provider, MD  amLODipine (NORVASC) 10 MG tablet Take 10 mg by mouth daily.    Historical Provider, MD  aspirin EC 325 MG tablet Take 325 mg by mouth daily.    Historical Provider, MD  atenolol (TENORMIN) 50 MG tablet Take 50 mg by mouth daily.    Historical Provider, MD  B Complex-C (B-COMPLEX WITH VITAMIN C) tablet Take 1 tablet by mouth daily.    Historical Provider, MD  docusate sodium (COLACE) 100 MG capsule Take 100 mg by mouth 2 (two)  times daily as needed for mild constipation.    Historical Provider, MD  furosemide (LASIX) 40 MG tablet Take 40 mg by mouth 2 (two) times daily.    Historical Provider, MD  lisinopril (PRINIVIL,ZESTRIL) 20 MG tablet Take 20 mg by mouth daily.    Historical Provider, MD  magnesium hydroxide (MILK OF MAGNESIA) 400 MG/5ML suspension Take 30 mLs by mouth every 4 (four) hours as needed for mild constipation.    Historical Provider, MD  Multiple Vitamin  (MULTIVITAMIN WITH MINERALS) TABS tablet Take 1 tablet by mouth daily.    Historical Provider, MD  ondansetron (ZOFRAN) 4 MG tablet Take 4 mg by mouth every 6 (six) hours as needed for nausea or vomiting.    Historical Provider, MD  oxyCODONE (OXY IR/ROXICODONE) 5 MG immediate release tablet Take 5-10 mg by mouth every 4 (four) hours as needed for moderate pain or severe pain.    Historical Provider, MD      VITAL SIGNS:  Blood pressure 124/65, pulse 78, temperature 97.1 F (36.2 C), temperature source Oral, resp. rate 29, height 5\' 8"  (1.727 m), weight 103 lb (46.72 kg), SpO2 95 %.  PHYSICAL EXAMINATION:  VITAL SIGNS: Filed Vitals:   12/09/15 1645 12/09/15 1730  BP: 116/56 124/65  Pulse: 66 78  Temp:    Resp: 28 29   GENERAL:74 y.o.female currently in no acute distress.Weakness/ Frail/Chronically ill appearing HEAD: Normocephalic, atraumatic.  EYES: Pupils equal, round, reactive to light. Extraocular muscles intact. No scleral icterus.  MOUTH: Moist mucosal membrane. Dentition intact. No abscess noted.  EAR, NOSE, THROAT: Clear without exudates. No external lesions.  NECK: Supple. No thyromegaly. No nodules. No JVD.  PULMONARY: Clear to ascultation, without wheeze rails or rhonci. No use of accessory muscles, Good respiratory effort. good air entry bilaterally CHEST: Nontender to palpation.  CARDIOVASCULAR: S1 and S2. Regular rate and rhythm. No murmurs, rubs, or gallops. No edema. Pedal pulses 2+ bilaterally.  GASTROINTESTINAL: Soft, nontender, nondistended. No masses. Positive bowel sounds. No hepatosplenomegaly.  MUSCULOSKELETAL: No swelling, clubbing, or edema. Range of motion full in all extremities.  NEUROLOGIC: Cranial nerves II through XII are intact. No gross focal neurological deficits. Sensation intact. Reflexes intact.  SKIN: Large open wound taking up almost the entire medial aspect of the right shin some areas of active bleed, surrounding erythema, sacral ulcer present  on admission No  rashes, or cyanosis. Skin warm and dry. Turgor intact.  PSYCHIATRIC: Mood, affect within normal limits. The patient is awake, alert and oriented x 3. Insight, judgment intact.    LABORATORY PANEL:   CBC  Recent Labs Lab 12/09/15 1631  WBC 10.1  HGB 8.6*  HCT 25.5*  PLT 507*   ------------------------------------------------------------------------------------------------------------------  Chemistries   Recent Labs Lab 12/09/15 1631  NA 120*  K 4.8  CL 81*  CO2 28  GLUCOSE 113*  BUN 40*  CREATININE 0.86  CALCIUM 8.3*  AST 24  ALT 11*  ALKPHOS 93  BILITOT 0.3   ------------------------------------------------------------------------------------------------------------------  Cardiac Enzymes  Recent Labs Lab 12/09/15 1631  TROPONINI <0.03   ------------------------------------------------------------------------------------------------------------------  RADIOLOGY:  Dg Chest 2 View  12/09/2015  CLINICAL DATA:  Generalized weakness and pt states some SOB. Hx of CHF, HTN and COPD. Former smoker. 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 There is perihilar peribronchial thickening. No focal consolidations or pleural effusions. The heart size is normal. IMPRESSION: Hyperinflation and bronchitic changes. No focal acute pulmonary  abnormality. Electronically Signed   By: Nolon Nations M.D.   On: 12/09/2015 17:09   Dg Hip Unilat With Pelvis 2-3 Views Left  12/09/2015  CLINICAL DATA:  75 year old female with chronic left hip pain. EXAM: DG HIP (WITH OR WITHOUT PELVIS) 2-3V LEFT COMPARISON:  06/15/2015 CT FINDINGS: Right total hip arthroplasty now identified. There is no acute fracture noted. Severe arthropathic changes within the left hip again noted with severe joint space loss, axial migration, subchondral sclerosis, cyst formation and severe acetabular protrusio. IMPRESSION: No  evidence of acute abnormality. 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 Electronically Signed   By: Margarette Canada M.D.   On: 12/09/2015 18:03    EKG:   Orders placed or performed during the hospital encounter of 12/09/15  . ED EKG  . ED EKG    IMPRESSION AND PLAN:   75 year old Caucasian female essential hypertension diastolic congestive heart failure presenting with weakness  1. Hyponatremia: Sodium deficit 210 to correct to 130, provide gentle IV fluid hydration, hold Lasix 2. Skin wound/pressure ulcer present on admission: Consult wound therapy will provide some antibiotic coverage with Keflex, pain medication 3. Diastolic congestive heart failure chronic: Hold Lasix as stated above as this is a contributing factor to her sodium level IV. Essential hypertension: Continue lisinopril, atenolol and Norvasc     All the records are reviewed and case discussed with ED provider. Management plans discussed with the patient, family and they are in agreement.  CODE STATUS: Full as discussed with patient at bedside  TOTAL TIME TAKING CARE OF THIS PATIENT: 33 minutes.    Hower,  Karenann Cai.D on 12/09/2015 at 6:16 PM  Between 7am to 6pm - Pager - [phone]  After 6pm: House Pager: - [phone]  [facility]  Office  [phone]  CC: Primary care physician; Kirk Ruths., MD

## 2015-12-09 NOTE — ED Provider Notes (Signed)
[facility] Emergency Department Provider Note   ____________________________________________  Time seen: Seen upon arrival to the emergency department  I have reviewed the triage vital signs and the nursing notes.   HISTORY  Chief Complaint Weakness   HPI Jacqueline Boyd is a 75 y.o. female with a history of CHF and hypertension who is presenting to the emergency department today with tingling to her hands or feet as well as left hip pain right calf pain and pain to a decubitus ulcer on her buttock. She says the symptoms have been worsening over the course of the last month and a half since she had her right hip replacement. She says that she sustained wounds because very sensitive skin that have not healed. She has been providing wound care at home without complete resolution. Denies any vomiting. Denies any chest or abdominal pain. Says left hip pain is worse with movement. Also complaining of generalized weakness.   Past Medical History  Diagnosis Date  . CHF (congestive heart failure) ([place])   . Hypertension   . COPD with emphysema [facility])     Patient Active Problem List   Diagnosis Date Noted  . NSTEMI (non-ST elevated myocardial infarction) ([place]) 11/12/2015  . Chronic diastolic heart failure ([place]) 11/08/2015  . Pulmonary hypertension ([place]) 11/08/2015  . Degenerative arthritis of hip 11/06/2015  . 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 Degeneration of intervertebral disc of lumbar region 05/09/2015  . Neuritis or radiculitis due to rupture of lumbar intervertebral disc 05/09/2015  . LBP (low back pain) 12/27/2014  . Coxitis 02/03/2014  . Chronic obstructive pulmonary disease ([place]) 05/04/2012  . Essential (primary) hypertension 05/04/2012    Past Surgical History  Procedure Laterality Date  . Hip surgery Right   . 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 acetaminophen (TYLENOL) 500 MG  tablet   Oral   Take 1,000 mg by mouth every 8 (eight) hours as needed.         Marland Kitchen albuterol (PROVENTIL HFA;VENTOLIN HFA) 108 (90 Base) MCG/ACT inhaler   Inhalation   Inhale 1-2 puffs into the lungs every 6 (six) hours as needed for wheezing or shortness of breath.         Marland Kitchen amLODipine (NORVASC) 10 MG tablet   Oral   Take 10 mg by mouth daily.         Marland Kitchen aspirin EC 325 MG tablet   Oral   Take 325 mg by mouth daily.         Marland Kitchen atenolol (TENORMIN) 50 MG tablet   Oral   Take 50 mg by mouth daily.         . B Complex-C (B-COMPLEX WITH VITAMIN C) tablet   Oral   Take 1 tablet by mouth daily.         Marland Kitchen docusate sodium (COLACE) 100 MG capsule   Oral   Take 100 mg by mouth 2 (two) times daily as needed for mild constipation.         . furosemide (LASIX) 40 MG tablet   Oral   Take 40 mg by mouth 2 (two) times daily.         Marland Kitchen lisinopril (PRINIVIL,ZESTRIL) 20 MG tablet   Oral   Take 20 mg by mouth daily.         . magnesium hydroxide (MILK OF MAGNESIA) 400 MG/5ML suspension  Oral   Take 30 mLs by mouth every 4 (four) hours as needed for mild constipation.         . Multiple Vitamin (MULTIVITAMIN WITH MINERALS) TABS tablet   Oral   Take 1 tablet by mouth daily.         . ondansetron (ZOFRAN) 4 MG tablet   Oral   Take 4 mg by mouth every 6 (six) hours as needed for nausea or vomiting.         Marland Kitchen oxyCODONE (OXY IR/ROXICODONE) 5 MG immediate release tablet   Oral   Take 5-10 mg by mouth every 4 (four) hours as needed for moderate pain or severe pain.           Allergies Codeine; Penicillins; and Prednisone  No family history on file.  Social History Social History  Substance Use Topics  . Smoking status: Former Smoker    Quit date: 11/11/1990  . Smokeless tobacco: None  . Alcohol Use: Yes    Review of Systems Constitutional: No fever/chills Eyes: No visual changes. ENT: No sore throat. Cardiovascular: Denies chest pain. Respiratory:  Denies shortness of breath. Gastrointestinal: No abdominal pain.  No nausea, no vomiting.  No diarrhea.  No constipation. Genitourinary: Negative for dysuria. Musculoskeletal: As above Skin: Negative for rash. Neurological: Negative for headaches, focal weakness or numbness.  10-point ROS otherwise negative.  ____________________________________________   PHYSICAL EXAM:  VITAL SIGNS: ED Triage Vitals  Enc Vitals Group     BP 12/09/15 1628 115/56 mmHg     Pulse Rate 12/09/15 1628 73     Resp 12/09/15 1628 16     Temp 12/09/15 1628 97.1 F (36.2 C)     Temp Source 12/09/15 1628 Oral     SpO2 12/09/15 1628 96 %     Weight 12/09/15 1628 103 lb (46.72 kg)     Height 12/09/15 1628 5\' 8"  (1.727 m)     Head Cir --      Peak Flow --      Pain Score 12/09/15 1629 9     Pain Loc --      Pain Edu? --      Excl. in [place]? --     Constitutional: Alert and oriented. Appears anxious and uncomfortable. Eyes: Conjunctivae are normal. PERRL. EOMI. Head: Atraumatic. Nose: No congestion/rhinnorhea. Mouth/Throat: Mucous membranes are moist.  Oropharynx non-erythematous. Neck: No stridor.   Cardiovascular: Normal rate, regular rhythm. Grossly normal heart sounds.  Bilateral and equal radial as well as dorsalis pedis pulses. Respiratory: Normal respiratory effort.  No retractions. Lungs CTAB. Gastrointestinal: Soft and nontender. No distention. No CVA tenderness. Musculoskeletal: No lower extremity tenderness nor edema.  No joint effusions. Tenderness to the left lateral hip. Able to passively range the hip but with patient experiencing pain. Neurologic:  Normal speech and language. No gross focal neurologic deficits are appreciated.  Skin:  Stage II decubitus ulcer to the left buttock which is 2 cm in circumferential. There is no surrounding erythema, no induration or pus. Also with a ulceration to the right medial calf that is about 3 x 12 cm and superficial. No induration. No pus. Mild  tenderness to palpation. Well-healed incision to the right hip with no tenderness palpation nor pain or restriction to passive range of motion. Psychiatric: Mood and affect are normal. Speech and behavior are normal.  ____________________________________________   LABS (all labs ordered are listed, but only abnormal results are displayed)  Labs Reviewed  CBC WITH DIFFERENTIAL/PLATELET - Abnormal;  Notable for the following:    RBC 2.93 (*)    Hemoglobin 8.6 (*)    HCT 25.5 (*)    RDW 14.7 (*)    Platelets 507 (*)    Neutro Abs 8.1 (*)    All other components within normal limits  COMPREHENSIVE METABOLIC PANEL - Abnormal; Notable for the following:    Sodium 120 (*)    Chloride 81 (*)    Glucose, Bld 113 (*)    BUN 40 (*)    Calcium 8.3 (*)    Albumin 3.3 (*)    ALT 11 (*)    All other components within normal limits  URINALYSIS COMPLETEWITH MICROSCOPIC ([facility] ONLY) - Abnormal; Notable for the following:    Color, Urine STRAW (*)    APPearance CLEAR (*)    Specific Gravity, Urine 1.003 (*)    Leukocytes, UA 1+ (*)    Squamous Epithelial / LPF 0-5 (*)    All other components within normal limits  LIPASE, BLOOD  TROPONIN I   ____________________________________________  EKG  ED ECG REPORT I, Doran Stabler, the attending physician, personally viewed and interpreted this ECG.   Date: 12/09/2015  EKG Time: 1632  Rate: 70  Rhythm: normal sinus rhythm  Axis: Normal axis  Intervals:left bundle branch block  ST&T Change: ST elevations in the precordial leads are consistent with left bundle branch block pattern. T wave inversion in aVL. Very similar appearance to the EKG from 07/10/2012 which also demonstrated a left bundle branch block.  ____________________________________________  RADIOLOGY   DG Chest 2 View (Final result) Result time: 12/09/15 17:09:45   Final result by Rad Results In Interface (12/09/15 17:09:45)   Narrative:   CLINICAL DATA: Generalized  weakness and pt states some SOB. Hx of CHF, HTN and COPD. Former smoker. 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 There is perihilar peribronchial thickening. No focal consolidations or pleural effusions. The heart size is normal.  IMPRESSION: Hyperinflation and bronchitic changes.  No focal acute pulmonary abnormality.   Electronically Signed By: Nolon Nations M.D. On: 12/09/2015 17:09       ____________________________________________   PROCEDURES   Procedures CRITICAL CARE Performed by: Doran Stabler   Total critical care time: 35 minutes  Critical care time was exclusive of separately billable procedures and treating other patients.  Critical care was necessary to treat or prevent imminent or life-threatening deterioration.  Critical care was time spent personally by me on the following activities: development of treatment plan with patient and/or surrogate as well as nursing, discussions with consultants, evaluation of patient's response to treatment, examination of patient, obtaining history from patient or surrogate, ordering and performing treatments and interventions, ordering and review of laboratory studies, ordering and review of radiographic studies, pulse oximetry and re-evaluation of patient's condition.  ____________________________________________   INITIAL IMPRESSION / ASSESSMENT AND PLAN / ED COURSE  Pertinent labs & imaging results that were available during my care of the patient were reviewed by me and considered in my medical decision making (see chart for details).  ----------------------------------------- 5:53 PM on 12/09/2015 -----------------------------------------  Patient found to have sodium of 120 which would explain her symptoms of weakness as well as tingling. Given liter of saline. We'll admit to the hospital. Signed out to Dr.  Verdell Carmine. Patient took letting of pain. We'll dose of Dilaudid. Patient says the plan permission as well as diagnosis and is  willing to comply. Denies drinking any excessive amount of water. Unclear cause of this time for the hyponatremia. ____________________________________________   FINAL CLINICAL IMPRESSION(S) / ED DIAGNOSES  Final diagnoses:  None      NEW MEDICATIONS STARTED DURING THIS VISIT:  New Prescriptions   No medications on file     Note:  This document was prepared using Dragon voice recognition software and may include unintentional dictation errors.    Orbie Pyo, MD 12/09/15 [phone]

## 2015-12-09 NOTE — ED Notes (Signed)
Called [facility] informed bed ready [phone]

## 2015-12-09 NOTE — ED Notes (Signed)
Patient brought to the ED via [facility] from home for generalized weakness and pain. Patient c/o pain in her left hip and right leg. Per EMS patient son reports patient has not ate since Tuesday. Patient is also c/o sore on her bottom.

## 2015-12-10 DIAGNOSIS — L8990 Pressure ulcer of unspecified site, unspecified stage: Secondary | ICD-10-CM | POA: Insufficient documentation

## 2015-12-10 LAB — BASIC METABOLIC PANEL
ANION GAP: 6 (ref 5–15)
BUN: 32 mg/dL — AB (ref 6–20)
CHLORIDE: 95 mmol/L — AB (ref 101–111)
CO2: 28 mmol/L (ref 22–32)
Calcium: 8.4 mg/dL — ABNORMAL LOW (ref 8.9–10.3)
Creatinine, Ser: 0.98 mg/dL (ref 0.44–1.00)
GFR calc Af Amer: >60 mL/min (ref >60)
GFR, EST NON AFRICAN AMERICAN: 55 mL/min — AB (ref >60)
GLUCOSE: 94 mg/dL (ref 65–99)
POTASSIUM: 4.2 mmol/L (ref 3.5–5.1)
Sodium: 129 mmol/L — ABNORMAL LOW (ref 135–145)

## 2015-12-10 LAB — CBC
HCT: 24.6 % — ABNORMAL LOW (ref 35.0–47.0)
HEMOGLOBIN: 8.2 g/dL — AB (ref 12.0–16.0)
MCH: 29.3 pg (ref 26.0–34.0)
MCHC: 33.4 g/dL (ref 32.0–36.0)
MCV: 87.7 fL (ref 80.0–100.0)
Platelets: 411 10*3/uL (ref 150–440)
RBC: 2.81 MIL/uL — ABNORMAL LOW (ref 3.80–5.20)
RDW: 15 % — ABNORMAL HIGH (ref 11.5–14.5)
WBC: 7.9 10*3/uL (ref 3.6–11.0)

## 2015-12-10 MED ORDER — OXYCODONE-ACETAMINOPHEN 7.5-325 MG PO TABS
1.0000 | ORAL_TABLET | ORAL | Status: DC | PRN
  Administered 2015-12-10 – 2015-12-12 (×9): 1 via ORAL
  Filled 2015-12-10 (×10): qty 1

## 2015-12-10 MED ORDER — LISINOPRIL 5 MG PO TABS
5.0000 mg | ORAL_TABLET | Freq: Every day | ORAL | Status: DC
  Administered 2015-12-10: 5 mg via ORAL
  Filled 2015-12-10: qty 1

## 2015-12-10 MED ORDER — ALPRAZOLAM 0.25 MG PO TABS
0.2500 mg | ORAL_TABLET | Freq: Three times a day (TID) | ORAL | Status: DC | PRN
  Administered 2015-12-10 – 2015-12-12 (×6): 0.25 mg via ORAL
  Filled 2015-12-10 (×6): qty 1

## 2015-12-10 NOTE — Progress Notes (Signed)
Patient arrived to 2A Room 253. Patient complains of pain 9/10 and all questions answered. Patient oriented to unit and instructed on using the call bell and room phone. Skin assessment completed with Abigail RN; stage II noted on sacrum, scattered bruises, and open skin tear on right lower leg. A&Ox4, VSS, and NSR on verified tele box #[id number]. Nursing staff will continue to monitor. Earleen Reaper, RN

## 2015-12-10 NOTE — Consult Note (Signed)
[place] Nurse wound consult note Reason for Consult: Painful, full thickness wound on Right medial LE, full thickness wound on right lateral LE, full thickness wound on left lateral thigh, Stage 3 Pressure Injury on left side of gluteal cleft. Patient states that wounds are related to surgical procedure she had approximately 1 month ago for a fractured right hip. Wound type: traumatic, pressure Pressure Ulcer POA: Yes Measurement:Right medial LE:  12cm x 5cm x 0.2cm with red, moist, moderately draining serous exudate, non-healing wound bed with "pockets" consistent with presentation of infection.  Right lateral LE with full thickness dry, pink ulcer measuring 3cm round x 0.1cm.  No exudate. Left lateral thigh with full thickness wound measuring 3cm x 2.5cm x 0.1cm with pink, moist, non-exudative wound bed.  Left side of gluteal cleft with Stage 3 pressure injury measuring 2cm x 1.6cm x 0.2cm with 50% pink, 50% yellow wound bed, no exudate. Wound bed:As described above Drainage (amount, consistency, odor) As described above Periwound:Intact, dry with numerous ecchymotic and areas with dried serum (scabs). Hemosiderin staining in the bilateral gaiter areas. Dressing procedure/placement/frequency: I will provide a mattress replacement with low air loss feature for patients comfort and to mitigate the difficulty she is having in turning and repositioning due to pain. This will not replace the need for turning and repositioning and patient is taught that turning and repositioning is for other reasons, such as pulmonary and skin integrity as well as skin inspection. The sacrum is intact, but the Stage 3 pressure injury to the left of the gluteal cleft, as well as the left lateral thigh, and right medial LE will be dressed daily with a calcium alginate dressing.  This dressings capacity for absorbency in addition to pain reduction are well-suited to her wounds.Systemic antibiotics are ordered by her provider to address  potential vs actual infection on the right medial LE. I have provided bilateral pressure redistribution heel boots to prevent heel ulceration and a pressure reduction chair cushion for her use when she is OOB to the chair. Thank you for asking [place] to consult on this nice woman.  The [place] nursing team will not follow, but will remain available to this patient, the nursing and medical teams.  Please re-consult if needed. Thanks, Maudie Flakes, MSN, RN, [place], Arther Abbott  Pager# [phone]

## 2015-12-10 NOTE — Progress Notes (Signed)
Alert and oriented. Vitals are stable. Patient has had two episodes where she states "I just feel funny and like I don't want to breathe". Vitals have been stable both times and symptoms resolved on their own. Patient states these are the symptoms she has been having at home. Patient has consistently taken pain medication for sacral, hip, and leg pain that is chronic due to wounds. Receiving morphine and oxycodone. Dr. Bridgett Larsson made aware that patient's pain has not been well controlled and MD changed to percocet 7.5/325. Patient has been sitting up in the chair all afternoon with some relief, but still having 6/10 pain at her lowest. Dressings applied to wound per wound care order. Sodium level improved today, IV fluids continued. Will continue to monitor.

## 2015-12-10 NOTE — Evaluation (Signed)
Physical Therapy Evaluation Patient Details Name: Geniene Escobedo MRN: [id number] DOB: [birth year] Today's Date: 12/10/2015   History of Present Illness  75 y/o female who had a R total hip replacement ~ 1 month ago and recently returned home from rehab.  She is admitted with hyponatremia and has multiple pressure ulcers/wounds and is having the most pain with the R LE wound.  Clinical Impression  Pt is having significant pain in LEs, especially R leg wound but is eager to work with PT and do some walking.  She does well, with good safety with standing and consistent but slow (somewhat guarded) ambulation.  Pt did have questions about stairs as her L (non-operative) hip is more problematic than her new R hip.  Pt having a lot of pain both at rest and especially with activity.  Pt will need to continue HHPT once she is ready for discharge.    Follow Up Recommendations Home health PT    Equipment Recommendations  None recommended by PT    Recommendations for Other Services       Precautions / Restrictions Precautions Precautions: Fall Restrictions Weight Bearing Restrictions: No      Mobility  Bed Mobility               General bed mobility comments: Pt in recliner on arrival  Transfers Overall transfer level: Independent Equipment used: Rolling walker (2 wheeled)             General transfer comment: Pt is able to rise to standing with need of UEs, not needing phyiscal assist.   Ambulation/Gait Ambulation/Gait assistance: Supervision Ambulation Distance (Feet): 60 Feet Assistive device: Rolling walker (2 wheeled)       General Gait Details: Pt with poor baseline posture and during ambulation has audible arthritic creeking in L hip.  She is able to ambulate with relative confidence and consistent (though asymetic gait).  She has no LOBs or safety concerns.  Stairs            Wheelchair Mobility    Modified Rankin (Stroke Patients Only)       Balance  Overall balance assessment: Modified Independent (Pt able to stand statically w/o AD)                                           Pertinent Vitals/Pain Pain Assessment: 0-10 Pain Score: 9  Pain Location: multiple wound sites, most severe in the L leg    Home Living Family/patient expects to be discharged to:: Private residence Living Arrangements: Children Available Help at Discharge: Family Type of Home: House Home Access: Stairs to enter Entrance Stairs-Rails: Right Entrance Stairs-Number of Steps: 5 Home Layout: One level Home Equipment: Environmental consultant - 2 wheels;Cane - single point Additional Comments: family is gone t/o the day, pt sleeps in recliner    Prior Function Level of Independence: Independent with assistive device(s)         Comments: Pt was able to get out of the house for quick trips into the community prior to hip replacement using a cane     Hand Dominance        Extremity/Trunk Assessment   Upper Extremity Assessment: Generalized weakness (arthritic issues limit ROM, weak but functional)           Lower Extremity Assessment: Generalized weakness;Overall [facility] for tasks assessed (R hip stronger than L, functional but pain  limited t/o)         Communication   Communication: No difficulties  Cognition Arousal/Alertness: Awake/alert Behavior During Therapy: WFL for tasks assessed/performed Overall Cognitive Status: Within Functional Limits for tasks assessed                      General Comments      Exercises        Assessment/Plan    PT Assessment Patient needs continued PT services  PT Diagnosis Difficulty walking;Generalized weakness   PT Problem List Decreased strength;Decreased range of motion;Decreased activity tolerance;Decreased balance;Decreased mobility;Pain  PT Treatment Interventions DME instruction;Gait training;Stair training;Functional mobility training;Therapeutic activities;Therapeutic  exercise;Balance training   PT Goals (Current goals can be found in the Care Plan section) Acute Rehab PT Goals Patient Stated Goal: control the pain PT Goal Formulation: With patient Time For Goal Achievement: 12/24/15 Potential to Achieve Goals: Good    Frequency Min 2X/week   Barriers to discharge        Co-evaluation               End of Session Equipment Utilized During Treatment: Gait belt Activity Tolerance: Patient limited by pain Patient left: with bed alarm set;with call bell/phone within reach;with nursing/sitter in room           Time: [id number] PT Time Calculation (min) (ACUTE ONLY): 28 min   Charges:   PT Evaluation $PT Eval Low Complexity: 1 Procedure     PT G CodesKreg Shropshire, DPT 12/10/2015, 4:27 PM

## 2015-12-10 NOTE — Progress Notes (Signed)
CSW received consult stating patient is from [place]. 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 There is a PT evaluation pending. CSW will follow patient to determine discharge needs.   Ernest Pine, MSW, [place], [place] Clinical Social Worker [phone]

## 2015-12-10 NOTE — Progress Notes (Signed)
Patient complaining of general discomfort and feeling like she cannot breath well. States " I can feel the fluid building up. I'm going to have a panic attack soon". Vital signs stable. No changes in lung sounds. Discussed with patient she may need help relaxing which will help her breathing. Verbalized understanding. Md notified. New medication given. Reassessed patient and anxiety has decreased and breathing had improved. Will continue to monitor.

## 2015-12-10 NOTE — Progress Notes (Addendum)
[place] at [place] NAME: Jacqueline Boyd    MR#:  [id number]  DATE OF BIRTH:  April 10, 1941  SUBJECTIVE:  CHIEF COMPLAINT:   Chief Complaint  Patient presents with  . Weakness   Right leg pain and weakness. REVIEW OF SYSTEMS:  Review of Systems  Constitutional: Positive for malaise/fatigue. Negative for fever and chills.  HENT: Negative for congestion.   Eyes: Negative for blurred vision and double vision.  Respiratory: Negative for cough, hemoptysis, sputum production, shortness of breath and wheezing.   Cardiovascular: Negative for chest pain, palpitations, orthopnea, claudication and leg swelling.  Gastrointestinal: Negative for heartburn, nausea, vomiting, abdominal pain, diarrhea and constipation.  Genitourinary: Negative for dysuria, urgency and frequency.  Musculoskeletal: Negative for back pain.       Right leg pain   Neurological: Positive for weakness. Negative for dizziness, focal weakness and headaches.  Psychiatric/Behavioral: Negative for depression. The patient is not nervous/anxious.     DRUG ALLERGIES:   Allergies  Allergen Reactions  . Codeine Other (See Comments)    Itching and "feels like worms crawling in body".  . Penicillins Rash  . Prednisone Palpitations   VITALS:  Blood pressure 150/54, pulse 66, temperature 97.9 F (36.6 C), temperature source Oral, resp. rate 18, height 5\' 8"  (1.727 m), weight 103 lb (46.72 kg), SpO2 93 %. PHYSICAL EXAMINATION:  Physical Exam  Constitutional: She is oriented to person, place, and time and well-developed, well-nourished, and in no distress. No distress.  HENT:  Head: Normocephalic and atraumatic.  Eyes: EOM are normal. Pupils are equal, round, and reactive to light.  Neck: Normal range of motion. Neck supple. No JVD present. No thyromegaly present.  Cardiovascular: Normal rate, regular rhythm and intact distal pulses.  Exam reveals no gallop and no friction rub.   No  murmur heard. Pulmonary/Chest: Effort normal and breath sounds normal. No respiratory distress. She has no wheezes. She has no rales.  Abdominal: She exhibits no distension. There is no tenderness. There is no rebound and no guarding.  Musculoskeletal: She exhibits tenderness. She exhibits no edema.  Right leg tenderness under knee, in dressing. She cannot move due to pain.  Neurological: She is alert and oriented to person, place, and time.  Skin: Skin is warm and dry. No rash noted. She is not diaphoretic. No erythema.  Psychiatric: Affect normal.   LABORATORY PANEL:   CBC  Recent Labs Lab 12/10/15 0452  WBC 7.9  HGB 8.2*  HCT 24.6*  PLT 411   ------------------------------------------------------------------------------------------------------------------ Chemistries   Recent Labs Lab 12/09/15 1631 12/10/15 0452  NA 120* 129*  K 4.8 4.2  CL 81* 95*  CO2 28 28  GLUCOSE 113* 94  BUN 40* 32*  CREATININE 0.86 0.98  CALCIUM 8.3* 8.4*  AST 24  --   ALT 11*  --   ALKPHOS 93  --   BILITOT 0.3  --    RADIOLOGY:  Dg Chest 2 View  12/09/2015  CLINICAL DATA:  Generalized weakness and pt states some SOB. Hx of CHF, HTN and COPD. Former smoker. 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 There is perihilar peribronchial thickening. No focal consolidations or pleural effusions. The heart size is normal. IMPRESSION: Hyperinflation and bronchitic changes. No focal acute pulmonary abnormality. Electronically Signed   By: Nolon Nations M.D.   On: 12/09/2015 17:09   Dg Hip Unilat With Pelvis 2-3  Views Left  12/09/2015  CLINICAL DATA:  75 year old female with chronic left hip pain. EXAM: DG HIP (WITH OR WITHOUT PELVIS) 2-3V LEFT COMPARISON:  06/15/2015 CT FINDINGS: Right total hip arthroplasty now identified. There is no acute fracture noted. 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 IMPRESSION: No evidence of acute abnormality. 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 Electronically Signed   By: Margarette Canada M.D.   On: 12/09/2015 18:03   ASSESSMENT AND PLAN:    75 year old Caucasian female essential hypertension diastolic congestive heart failure presenting with weakness  1. Hyponatremia: better.  hold Lasix, continue NS iv and f/u BMP.  2. Skin wound/pressure ulcer of right leg:  Wound care and prn pain medication.  3. Chronic Diastolic congestive heart failure chronic: Hold Lasix. Stable.   4. Essential hypertension:  Continue atenolol, Decrease lisinopril to 5 mg daily, hold  Norvasc due to low BP.  5. COPD. Stable. NEB prn.  Weakness: PT. All the records are reviewed and case discussed with Care Management/Social Worker. Management plans discussed with the patient, family and they are in agreement.  CODE STATUS: full code  TOTAL TIME TAKING CARE OF THIS PATIENT: 35 minutes.   More than 50% of the time was spent in counseling/coordination of care: YES  POSSIBLE D/C IN 2 DAYS, DEPENDING ON CLINICAL CONDITION.   Demetrios Loll M.D on 12/10/2015 at 1:31 PM  Between 7am to 6pm - Pager - [phone]  After 6pm go to [email or web address] - Proofreader  [facility]  Office  [phone]  CC: Primary care physician; Kirk Ruths., MD  Note: This dictation was prepared with Dragon dictation along with smaller phrase technology. Any transcriptional errors that result from this process are unintentional.

## 2015-12-11 DIAGNOSIS — E43 Unspecified severe protein-calorie malnutrition: Secondary | ICD-10-CM | POA: Insufficient documentation

## 2015-12-11 LAB — BASIC METABOLIC PANEL
ANION GAP: 6 (ref 5–15)
BUN: 21 mg/dL — ABNORMAL HIGH (ref 6–20)
CALCIUM: 8.5 mg/dL — AB (ref 8.9–10.3)
CO2: 25 mmol/L (ref 22–32)
CREATININE: 0.63 mg/dL (ref 0.44–1.00)
Chloride: 103 mmol/L (ref 101–111)
GFR calc non Af Amer: >60 mL/min (ref >60)
Glucose, Bld: 90 mg/dL (ref 65–99)
Potassium: 4.7 mmol/L (ref 3.5–5.1)
SODIUM: 134 mmol/L — AB (ref 135–145)

## 2015-12-11 MED ORDER — ENSURE ENLIVE PO LIQD
237.0000 mL | Freq: Three times a day (TID) | ORAL | Status: DC
  Administered 2015-12-11 (×3): 237 mL via ORAL

## 2015-12-11 NOTE — Progress Notes (Signed)
[facility]  Patient Status: Active  [facility] is providing the following services: SN/PT/OT  If patient discharges after hours, please call [phone].   [place] 12/11/2015, 9:15 AM

## 2015-12-11 NOTE — Progress Notes (Signed)
[place] at [place] NAME: Abbiegail Hampel    MR#:  [id number]  DATE OF BIRTH:  [birth year]  SUBJECTIVE:  CHIEF COMPLAINT:   Chief Complaint  Patient presents with  . Weakness   Still Right leg pain and weakness. REVIEW OF SYSTEMS:  Review of Systems  Constitutional: Positive for malaise/fatigue. Negative for fever and chills.  HENT: Negative for congestion.   Eyes: Negative for blurred vision and double vision.  Respiratory: Negative for cough, hemoptysis, sputum production, shortness of breath and wheezing.   Cardiovascular: Negative for chest pain, palpitations, orthopnea, claudication and leg swelling.  Gastrointestinal: Negative for heartburn, nausea, vomiting, abdominal pain, diarrhea and constipation.  Genitourinary: Negative for dysuria, urgency and frequency.  Musculoskeletal: Negative for back pain.       Right leg pain   Neurological: Positive for weakness. Negative for dizziness, focal weakness and headaches.  Psychiatric/Behavioral: Negative for depression. The patient is not nervous/anxious.     DRUG ALLERGIES:   Allergies  Allergen Reactions  . Codeine Other (See Comments)    Itching and "feels like worms crawling in body".  . Penicillins Rash  . Prednisone Palpitations   VITALS:  Blood pressure 102/50, pulse 68, temperature 97.6 F (36.4 C), temperature source Oral, resp. rate 18, height 5\' 8"  (1.727 m), weight 103 lb (46.72 kg), SpO2 95 %. PHYSICAL EXAMINATION:  Physical Exam  Constitutional: She is oriented to person, place, and time and well-developed, well-nourished, and in no distress. No distress.  HENT:  Head: Normocephalic and atraumatic.  Eyes: EOM are normal. Pupils are equal, round, and reactive to light.  Neck: Normal range of motion. Neck supple. No JVD present. No thyromegaly present.  Cardiovascular: Normal rate, regular rhythm and intact distal pulses.  Exam reveals no gallop and no friction rub.    No murmur heard. Pulmonary/Chest: Effort normal and breath sounds normal. No respiratory distress. She has no wheezes. She has no rales.  Abdominal: She exhibits no distension. There is no tenderness. There is no rebound and no guarding.  Musculoskeletal: She exhibits tenderness. She exhibits no edema.  Right leg tenderness under knee, in dressing. She cannot move due to pain.  Neurological: She is alert and oriented to person, place, and time.  Skin: Skin is warm and dry. No rash noted. She is not diaphoretic. No erythema.  Psychiatric: Affect normal.   LABORATORY PANEL:   CBC  Recent Labs Lab 12/10/15 0452  WBC 7.9  HGB 8.2*  HCT 24.6*  PLT 411   ------------------------------------------------------------------------------------------------------------------ Chemistries   Recent Labs Lab 12/09/15 1631  12/11/15 0657  NA 120*  < > 134*  K 4.8  < > 4.7  CL 81*  < > 103  CO2 28  < > 25  GLUCOSE 113*  < > 90  BUN 40*  < > 21*  CREATININE 0.86  < > 0.63  CALCIUM 8.3*  < > 8.5*  AST 24  --   --   ALT 11*  --   --   ALKPHOS 93  --   --   BILITOT 0.3  --   --   < > = values in this interval not displayed. RADIOLOGY:  No results found. ASSESSMENT AND PLAN:    75 year old Caucasian female essential hypertension diastolic congestive heart failure presenting with weakness  1. Hyponatremia:  hold Lasix, improving with NS iv.  2. Skin wound/pressure ulcer of right leg:  Wound care and prn pain medication. On  clindamycin.  3. Chronic Diastolic congestive heart failure chronic: Hold Lasix. Stable.   4. Essential hypertension:  discontinue atenolol, lisinopril and Norvasc due to low BP.  5. COPD. Stable. NEB prn.  Weakness: PT evaluation suggested HHPT. All the records are reviewed and case discussed with Care Management/Social Worker. Management plans discussed with the patient, family and they are in agreement.  CODE STATUS: full code  TOTAL TIME TAKING CARE  OF THIS PATIENT: 26 minutes.   More than 50% of the time was spent in counseling/coordination of care: YES  POSSIBLE D/C IN 1 DAYS, DEPENDING ON CLINICAL CONDITION.   Demetrios Loll M.D on 12/11/2015 at 5:11 PM  Between 7am to 6pm - Pager - [phone]  After 6pm go to [email or web address] - Proofreader  [facility]  Office  [phone]  CC: Primary care physician; Kirk Ruths., MD  Note: This dictation was prepared with Dragon dictation along with smaller phrase technology. Any transcriptional errors that result from this process are unintentional.

## 2015-12-11 NOTE — Care Management Important Message (Signed)
Important Message  Patient Details  Name: Jacqueline Boyd MRN: [id number] Date of Birth: 1940-06-29   Medicare Important Message Given:  Yes    Jolly Mango, RN 12/11/2015, 12:52 PM

## 2015-12-11 NOTE — Progress Notes (Signed)
Initial Nutrition Assessment  DOCUMENTATION CODES:   Severe malnutrition in context of chronic illness, Underweight  INTERVENTION:  -Recommend Ensure Enlive po TID, each supplement provides 350 kcal and 20 grams of protein -Encourage smaller, more frequent meals -Agree with Regular diet, MVI supplementation  NUTRITION DIAGNOSIS:   Malnutrition related to chronic illness as evidenced by severe depletion of body fat, severe depletion of muscle mass, energy intake < or equal to 75% for > or equal to 1 month.  GOAL:   Patient will meet greater than or equal to 90% of their needs  MONITOR:   PO intake, Supplement acceptance, Labs, Weight trends  REASON FOR ASSESSMENT:    (Low BMI)    ASSESSMENT:    75 yo female admitted with weakness, hyponatremia; pt with hx of CHF  Pt report she does not feel well, no specific complaints, just generally does not feel well. Pt reports poor appetite since her hip surgery in June of this year; pt at Rehab until Wednesday of this past week. Pt reports eating bites/sips of meal trays at Rehab, reports only drinking Ensure (3 per day) since discharge. Pt reports no recent wt loss, reports she lost significant wt several years ago when she had pneumonia and has been unable to gain weight since  Nutrition-Focused physical exam completed. Findings are severe fat depletion, severe muscle depletion, and no edema.    Past Medical History  Diagnosis Date  . CHF (congestive heart failure) ([place])   . Hypertension   . 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  Estimated Nutritional Needs:   Kcal:  1600-1800 kcals   Protein:  >/= 80 g  Fluid:  >/= 1.6 L  EDUCATION NEEDS:   No education needs identified at this time  [place], [place], [place] [phone] Pager  [phone] Weekend/On-Call Pager

## 2015-12-11 NOTE — Care Management (Signed)
[facility] active with [facility]. Will need resumption of care orders.

## 2015-12-12 LAB — BASIC METABOLIC PANEL
ANION GAP: 5 (ref 5–15)
BUN: 21 mg/dL — ABNORMAL HIGH (ref 6–20)
CALCIUM: 8.6 mg/dL — AB (ref 8.9–10.3)
CO2: 26 mmol/L (ref 22–32)
CREATININE: 0.6 mg/dL (ref 0.44–1.00)
Chloride: 102 mmol/L (ref 101–111)
Glucose, Bld: 87 mg/dL (ref 65–99)
Potassium: 4.9 mmol/L (ref 3.5–5.1)
SODIUM: 133 mmol/L — AB (ref 135–145)

## 2015-12-12 MED ORDER — ALPRAZOLAM 0.25 MG PO TABS: 0.2500 mg | ORAL_TABLET | Freq: Three times a day (TID) | ORAL | Status: DC | PRN

## 2015-12-12 NOTE — Progress Notes (Signed)
Physical Therapy Treatment Patient Details Name: Jacqueline Boyd MRN: [id number] DOB: 10-13-1940 Today's Date: 12/12/2015    History of Present Illness 75 y/o female who had a R total hip replacement ~ 1 month ago and recently returned home from rehab.  She is admitted with hyponatremia and has multiple pressure ulcers/wounds and is having the most pain with the R LE wound.    PT Comments    Pt agrees to session.  Reported she is going home today but remains concerned about stairs.  Pt taken to PT gym for stair training.  Ambulated in gym 1' with walker and multiple turns in room.  After rest, she was able to go up/down stairs with right rail and left cane.  She is unable to independently lift her leg, right or left, and place it on the step.  She requires min assist but once placed she is able to ascend with CGA.  She places right foot first to go up and down as she feels this is safer and best for her.  Pt felt more confident after session today.   Follow Up Recommendations  Home health PT     Equipment Recommendations  None recommended by PT    Recommendations for Other Services       Precautions / Restrictions Precautions Precautions: Fall Restrictions Weight Bearing Restrictions: No    Mobility  Bed Mobility               General bed mobility comments: Pt in recliner on arrival, reports needing assistance.  Transfers Overall transfer level: Independent Equipment used: Rolling walker (2 wheeled)                Ambulation/Gait Ambulation/Gait assistance: Supervision Ambulation Distance (Feet): 50 Feet (with turns) Assistive device: Rolling walker (2 wheeled) Gait Pattern/deviations: Step-through pattern         Stairs Stairs: Yes Stairs assistance: Min assist Stair Management: One rail Right;With cane Number of Stairs: 4 General stair comments: Needed min assist to place right leg on stair.    Wheelchair Mobility    Modified Rankin (Stroke  Patients Only)       Balance Overall balance assessment: Modified Independent                                  Cognition Arousal/Alertness: Awake/alert Behavior During Therapy: WFL for tasks assessed/performed Overall Cognitive Status: Within Functional Limits for tasks assessed                      Exercises      General Comments        Pertinent Vitals/Pain Pain Assessment: 0-10 Pain Score: 7  Pain Location: wounds    Home Living                      Prior Function            PT Goals (current goals can now be found in the care plan section) Progress towards PT goals: Progressing toward goals    Frequency  Min 2X/week    PT Plan Current plan remains appropriate    Co-evaluation             End of Session Equipment Utilized During Treatment: Gait belt Activity Tolerance: Other (comment) (Limited by SOB) Patient left: in chair;with call bell/phone within reach;with chair alarm set     Time: [id number] PT  Time Calculation (min) (ACUTE ONLY): 35 min  Charges:  $Gait Training: 23-37 mins                    G Codes:      Chesley Noon, PTA 12/12/2015, 1:02 PM

## 2015-12-12 NOTE — Discharge Instructions (Signed)
Regular diet. Activity as tolerated. Fall precaution. HHPT.

## 2015-12-12 NOTE — Discharge Summary (Signed)
[place] at [place] NAME: Jacqueline Boyd    MR#:  [id number]  DATE OF BIRTH:  [birth year]  DATE OF ADMISSION:  12/09/2015 ADMITTING PHYSICIAN: Lytle Butte, MD  DATE OF DISCHARGE:12/12/2015  PRIMARY CARE PHYSICIAN: No primary care provider on file.    ADMISSION DIAGNOSIS:  Hyponatremia [E87.1] Left hip pain [M25.552]  DISCHARGE DIAGNOSIS:  Principal Problem:   Hyponatremia Active Problems:   Pressure ulcer   Protein-calorie malnutrition, severe   SECONDARY DIAGNOSIS:   Past Medical History  Diagnosis Date  . CHF (congestive heart failure) ([place])   . Hypertension   . COPD with emphysema [facility])     HOSPITAL COURSE:   75 year old Caucasian female essential hypertension diastolic congestive heart failure presenting with weakness  1. Hyponatremia: hold Lasix, improving with NS iv.  2. Skin wound/pressure ulcer of right leg:  Wound care and prn pain medication. On clindamycin. No signs of infection, discontinue clindamycin.  3. Chronic Diastolic congestive heart failure chronic: Hold Lasix. Stable.   4. Essential hypertension: discontinued atenolol, lisinopril and Norvasc due to low BP.  5. COPD. Stable. NEB prn.  * anxiety. Xanax prn.  DISCHARGE CONDITIONS:   Stable, discharge to home with HHPT today.  CONSULTS OBTAINED:  Treatment Team:  Lytle Butte, MD  DRUG ALLERGIES:   Allergies  Allergen Reactions  . Codeine Other (See Comments)    Itching and "feels like worms crawling in body".  . Penicillins Rash  . Prednisone Palpitations    DISCHARGE MEDICATIONS:   Current Discharge Medication List    START taking these medications   Details  ALPRAZolam (XANAX) 0.25 MG tablet Take 1 tablet (0.25 mg total) by mouth 3 (three) times daily as needed for anxiety. Qty: 15 tablet, Refills: 0      CONTINUE these medications which have NOT CHANGED   Details  aspirin EC 325 MG tablet Take 325 mg by  mouth daily.    B Complex-C (B-COMPLEX WITH VITAMIN C) tablet Take 1 tablet by mouth daily.    Multiple Vitamin (MULTIVITAMIN WITH MINERALS) TABS tablet Take 1 tablet by mouth daily.    acetaminophen (TYLENOL) 500 MG tablet Take 1,000 mg by mouth every 8 (eight) hours as needed.    albuterol (PROVENTIL HFA;VENTOLIN HFA) 108 (90 Base) MCG/ACT inhaler Inhale 1-2 puffs into the lungs every 6 (six) hours as needed for wheezing or shortness of breath.    docusate sodium (COLACE) 100 MG capsule Take 100 mg by mouth 2 (two) times daily as needed for mild constipation.    magnesium hydroxide (MILK OF MAGNESIA) 400 MG/5ML suspension Take 30 mLs by mouth every 4 (four) hours as needed for mild constipation.    ondansetron (ZOFRAN) 4 MG tablet Take 4 mg by mouth every 6 (six) hours as needed for nausea or vomiting.    oxyCODONE (OXY IR/ROXICODONE) 5 MG immediate release tablet Take 5-10 mg by mouth every 4 (four) hours as needed for moderate pain or severe pain.      STOP taking these medications     amLODipine (NORVASC) 10 MG tablet      atenolol (TENORMIN) 50 MG tablet      lisinopril (PRINIVIL,ZESTRIL) 20 MG tablet      furosemide (LASIX) 40 MG tablet          DISCHARGE INSTRUCTIONS:    DIET:  Regular diet.  DISCHARGE CONDITION:  Stable.  ACTIVITY:  As tolerated.  OXYGEN:  No  DISCHARGE LOCATION:  If you experience worsening of your admission symptoms, develop shortness of breath, life threatening emergency, suicidal or homicidal thoughts you must seek medical attention immediately by calling 911 or calling your MD immediately  if symptoms less severe.  You Must read complete instructions/literature along with all the possible adverse reactions/side effects for all the Medicines you take and that have been prescribed to you. Take any new Medicines after you have completely understood and accpet all the possible adverse reactions/side effects.   Please note  You were  cared for by a hospitalist during your hospital stay. If you have any questions about your discharge medications or the care you received while you were in the hospital after you are discharged, you can call the unit and asked to speak with the hospitalist on call if the hospitalist that took care of you is not available. Once you are discharged, your primary care physician will handle any further medical issues. Please note that NO REFILLS for any discharge medications will be authorized once you are discharged, as it is imperative that you return to your primary care physician (or establish a relationship with a primary care physician if you do not have one) for your aftercare needs so that they can reassess your need for medications and monitor your lab values.    On the day of Discharge:  VITAL SIGNS:  Blood pressure 127/65, pulse 90, temperature 97.6 F (36.4 C), temperature source Oral, resp. rate 18, height 5\' 8"  (1.727 m), weight 103 lb (46.72 kg), SpO2 96 %.  PHYSICAL EXAMINATION:  GENERAL:  75 y.o.-year-old patient lying in the bed with no acute distress.  EYES: Pupils equal, round, reactive to light and accommodation. No scleral icterus. Extraocular muscles intact.  HEENT: Head atraumatic, normocephalic. Oropharynx and nasopharynx clear.  NECK:  Supple, no jugular venous distention. No thyroid enlargement, no tenderness.  LUNGS: diminished breath sounds bilaterally, no wheezing, rales,rhonchi or crepitation. No use of accessory muscles of respiration.  CARDIOVASCULAR: S1, S2 normal. No murmurs, rubs, or gallops.  ABDOMEN: Soft, non-tender, non-distended. Bowel sounds present. No organomegaly or mass.  EXTREMITIES: No pedal edema, cyanosis, or clubbing. Right leg in dressing and tenderness. NEUROLOGIC: Cranial nerves II through XII are intact. Muscle strength 4/5 in all extremities. Sensation intact. Gait not checked.  PSYCHIATRIC: The patient is alert and oriented x 3. Anxious. SKIN:  No obvious rash, lesion, or ulcer.  DATA REVIEW:   CBC  Recent Labs Lab 12/10/15 0452  WBC 7.9  HGB 8.2*  HCT 24.6*  PLT 411    Chemistries   Recent Labs Lab 12/09/15 1631  12/12/15 0533  NA 120*  < > 133*  K 4.8  < > 4.9  CL 81*  < > 102  CO2 28  < > 26  GLUCOSE 113*  < > 87  BUN 40*  < > 21*  CREATININE 0.86  < > 0.60  CALCIUM 8.3*  < > 8.6*  AST 24  --   --   ALT 11*  --   --   ALKPHOS 93  --   --   BILITOT 0.3  --   --   < > = values in this interval not displayed.  Cardiac Enzymes  Recent Labs Lab 12/09/15 1631  TROPONINI <0.03    Microbiology Results  Results for orders placed or performed during the hospital encounter of 11/11/15  Surgical pcr screen     Status: None   Collection Time: 11/12/15  4:30 AM  Result Value Ref Range Status   MRSA, PCR NEGATIVE NEGATIVE Final   Staphylococcus aureus NEGATIVE NEGATIVE Final    Comment:        The Xpert SA Assay (FDA approved for NASAL specimens in patients over 44 years of age), is one component of a comprehensive surveillance program.  Test performance has been validated by [facility] for patients greater than or equal to 69 year old. It is not intended to diagnose infection nor to guide or monitor treatment.     RADIOLOGY:  No results found.   Management plans discussed with the patient, family and they are in agreement.  CODE STATUS:     Code Status Orders        Start     Ordered   12/09/15 1759  Full code   Continuous     12/09/15 1759    Code Status History    Date Active Date Inactive Code Status Order ID Comments User Context   11/12/2015  3:18 AM 11/13/2015 10:54 PM Full Code [id number]  Harrie Foreman, MD Inpatient      TOTAL TIME TAKING CARE OF THIS PATIENT: 33 minutes.    Demetrios Loll M.D on 12/12/2015 at 1:14 PM  Between 7am to 6pm - Pager - [phone]  After 6pm go to [email or web address] - password [email or web address] [facility]  [facility]  Office   [phone]  CC: Primary care physician; No primary care provider on file.   Note: This dictation was prepared with Dragon dictation along with smaller phrase technology. Any transcriptional errors that result from this process are unintentional.

## 2015-12-12 NOTE — Progress Notes (Signed)
Patient taken off the floor via wheelchair by staff. Patient discharged home with home health. Family at bedside to transport patient. No distress noted, discharged on RA

## 2015-12-12 NOTE — Care Management (Signed)
Will have resumption of care with advanced home care.

## 2015-12-12 NOTE — Progress Notes (Signed)
A&O. Up with one assist. Medicated for pain and anxiety during the night. Having pain in leg wounds. Slept on and off during the shift.

## 2015-12-12 NOTE — Progress Notes (Signed)
Discharge instructions along with home medication list and follow up gone over with patient. Patient verbalized that she understood. One printed rx given to patient. No distress noted. Patient waiting on ride, will remove iv and telemetry when ride available. Will continue to monitor

## 2015-12-22 ENCOUNTER — Emergency Department: Payer: Commercial Managed Care - HMO

## 2015-12-22 ENCOUNTER — Encounter: Payer: Self-pay | Admitting: Emergency Medicine

## 2015-12-22 ENCOUNTER — Observation Stay
Admission: EM | Admit: 2015-12-22 | Discharge: 2015-12-25 | Disposition: A | Discharge status: Skilled Nursing Facility | Payer: Commercial Managed Care - HMO | Attending: Internal Medicine | Admitting: Internal Medicine

## 2015-12-22 DIAGNOSIS — R531 Weakness: Secondary | ICD-10-CM

## 2015-12-22 DIAGNOSIS — R0602 Shortness of breath: Secondary | ICD-10-CM | POA: Diagnosis present

## 2015-12-22 DIAGNOSIS — Z888 Allergy status to other drugs, medicaments and biological substances status: Secondary | ICD-10-CM | POA: Diagnosis not present

## 2015-12-22 DIAGNOSIS — Z8249 Family history of ischemic heart disease and other diseases of the circulatory system: Secondary | ICD-10-CM | POA: Insufficient documentation

## 2015-12-22 DIAGNOSIS — Z885 Allergy status to narcotic agent status: Secondary | ICD-10-CM | POA: Diagnosis not present

## 2015-12-22 DIAGNOSIS — E875 Hyperkalemia: Secondary | ICD-10-CM | POA: Insufficient documentation

## 2015-12-22 DIAGNOSIS — J9610 Chronic respiratory failure, unspecified whether with hypoxia or hypercapnia: Secondary | ICD-10-CM | POA: Insufficient documentation

## 2015-12-22 DIAGNOSIS — Z880 Allergy status to penicillin: Secondary | ICD-10-CM | POA: Insufficient documentation

## 2015-12-22 DIAGNOSIS — J449 Chronic obstructive pulmonary disease, unspecified: Secondary | ICD-10-CM | POA: Diagnosis not present

## 2015-12-22 DIAGNOSIS — E43 Unspecified severe protein-calorie malnutrition: Secondary | ICD-10-CM | POA: Diagnosis not present

## 2015-12-22 DIAGNOSIS — I5032 Chronic diastolic (congestive) heart failure: Secondary | ICD-10-CM | POA: Insufficient documentation

## 2015-12-22 DIAGNOSIS — Y939 Activity, unspecified: Secondary | ICD-10-CM | POA: Diagnosis not present

## 2015-12-22 DIAGNOSIS — I272 Other secondary pulmonary hypertension: Secondary | ICD-10-CM | POA: Insufficient documentation

## 2015-12-22 DIAGNOSIS — J432 Centrilobular emphysema: Secondary | ICD-10-CM | POA: Diagnosis not present

## 2015-12-22 DIAGNOSIS — I110 Hypertensive heart disease with heart failure: Secondary | ICD-10-CM | POA: Insufficient documentation

## 2015-12-22 DIAGNOSIS — I700 Atherosclerosis of aorta: Secondary | ICD-10-CM | POA: Diagnosis not present

## 2015-12-22 DIAGNOSIS — X58XXXD Exposure to other specified factors, subsequent encounter: Secondary | ICD-10-CM | POA: Diagnosis not present

## 2015-12-22 DIAGNOSIS — Z79899 Other long term (current) drug therapy: Secondary | ICD-10-CM | POA: Insufficient documentation

## 2015-12-22 DIAGNOSIS — Z7982 Long term (current) use of aspirin: Secondary | ICD-10-CM | POA: Insufficient documentation

## 2015-12-22 DIAGNOSIS — E861 Hypovolemia: Secondary | ICD-10-CM | POA: Diagnosis not present

## 2015-12-22 DIAGNOSIS — R05 Cough: Secondary | ICD-10-CM | POA: Diagnosis not present

## 2015-12-22 DIAGNOSIS — S71111A Laceration without foreign body, right thigh, initial encounter: Secondary | ICD-10-CM | POA: Insufficient documentation

## 2015-12-22 DIAGNOSIS — I2510 Atherosclerotic heart disease of native coronary artery without angina pectoris: Secondary | ICD-10-CM | POA: Insufficient documentation

## 2015-12-22 DIAGNOSIS — X58XXXA Exposure to other specified factors, initial encounter: Secondary | ICD-10-CM | POA: Insufficient documentation

## 2015-12-22 DIAGNOSIS — E871 Hypo-osmolality and hyponatremia: Secondary | ICD-10-CM | POA: Diagnosis not present

## 2015-12-22 DIAGNOSIS — L89323 Pressure ulcer of left buttock, stage 3: Secondary | ICD-10-CM | POA: Diagnosis not present

## 2015-12-22 DIAGNOSIS — G8929 Other chronic pain: Secondary | ICD-10-CM | POA: Insufficient documentation

## 2015-12-22 DIAGNOSIS — F419 Anxiety disorder, unspecified: Secondary | ICD-10-CM | POA: Insufficient documentation

## 2015-12-22 DIAGNOSIS — Z87891 Personal history of nicotine dependence: Secondary | ICD-10-CM | POA: Insufficient documentation

## 2015-12-22 DIAGNOSIS — K219 Gastro-esophageal reflux disease without esophagitis: Secondary | ICD-10-CM | POA: Insufficient documentation

## 2015-12-22 DIAGNOSIS — M247 Protrusio acetabuli: Secondary | ICD-10-CM | POA: Insufficient documentation

## 2015-12-22 DIAGNOSIS — M5116 Intervertebral disc disorders with radiculopathy, lumbar region: Secondary | ICD-10-CM | POA: Insufficient documentation

## 2015-12-22 LAB — COMPREHENSIVE METABOLIC PANEL
ALK PHOS: 111 U/L (ref 38–126)
ALT: 12 U/L — AB (ref 14–54)
AST: 24 U/L (ref 15–41)
Albumin: 4.1 g/dL (ref 3.5–5.0)
Anion gap: 12 (ref 5–15)
BUN: 26 mg/dL — AB (ref 6–20)
CALCIUM: 9.4 mg/dL (ref 8.9–10.3)
CHLORIDE: 92 mmol/L — AB (ref 101–111)
CO2: 22 mmol/L (ref 22–32)
CREATININE: 0.77 mg/dL (ref 0.44–1.00)
Glucose, Bld: 99 mg/dL (ref 65–99)
Potassium: 4.3 mmol/L (ref 3.5–5.1)
Sodium: 126 mmol/L — ABNORMAL LOW (ref 135–145)
TOTAL PROTEIN: 7.9 g/dL (ref 6.5–8.1)
Total Bilirubin: 0.3 mg/dL (ref 0.3–1.2)

## 2015-12-22 LAB — URINALYSIS COMPLETE WITH MICROSCOPIC (ARMC ONLY)
BILIRUBIN URINE: NEGATIVE
GLUCOSE, UA: NEGATIVE mg/dL
Hgb urine dipstick: NEGATIVE
KETONES UR: NEGATIVE mg/dL
NITRITE: NEGATIVE
PROTEIN: NEGATIVE mg/dL
SPECIFIC GRAVITY, URINE: 1.009 (ref 1.005–1.030)
Squamous Epithelial / LPF: NONE SEEN
pH: 6 (ref 5.0–8.0)

## 2015-12-22 LAB — BRAIN NATRIURETIC PEPTIDE: B NATRIURETIC PEPTIDE 5: 85 pg/mL (ref 0.0–100.0)

## 2015-12-22 LAB — CBC
HCT: 30.7 % — ABNORMAL LOW (ref 35.0–47.0)
Hemoglobin: 10.1 g/dL — ABNORMAL LOW (ref 12.0–16.0)
MCH: 28 pg (ref 26.0–34.0)
MCHC: 32.9 g/dL (ref 32.0–36.0)
MCV: 85.1 fL (ref 80.0–100.0)
PLATELETS: 531 10*3/uL — AB (ref 150–440)
RBC: 3.61 MIL/uL — AB (ref 3.80–5.20)
RDW: 15.4 % — AB (ref 11.5–14.5)
WBC: 6.4 10*3/uL (ref 3.6–11.0)

## 2015-12-22 LAB — TROPONIN I

## 2015-12-22 MED ORDER — ALPRAZOLAM 0.25 MG PO TABS
0.2500 mg | ORAL_TABLET | Freq: Three times a day (TID) | ORAL | Status: DC | PRN
  Administered 2015-12-22 – 2015-12-25 (×6): 0.25 mg via ORAL
  Filled 2015-12-22 (×6): qty 1

## 2015-12-22 MED ORDER — ONDANSETRON HCL 4 MG/2ML IJ SOLN
4.0000 mg | Freq: Once | INTRAMUSCULAR | Status: AC
  Administered 2015-12-22: 4 mg via INTRAVENOUS

## 2015-12-22 MED ORDER — ASPIRIN EC 325 MG PO TBEC
325.0000 mg | DELAYED_RELEASE_TABLET | Freq: Every day | ORAL | Status: DC
  Administered 2015-12-23 – 2015-12-25 (×3): 325 mg via ORAL
  Filled 2015-12-22 (×3): qty 1

## 2015-12-22 MED ORDER — SODIUM CHLORIDE 0.9 % IV BOLUS (SEPSIS)
500.0000 mL | Freq: Once | INTRAVENOUS | Status: AC
  Administered 2015-12-22: 500 mL via INTRAVENOUS

## 2015-12-22 MED ORDER — ONDANSETRON HCL 4 MG/2ML IJ SOLN: 4.0000 mg | Freq: Four times a day (QID) | INTRAMUSCULAR | Status: DC | PRN

## 2015-12-22 MED ORDER — ACETAMINOPHEN 325 MG PO TABS: 650.0000 mg | ORAL_TABLET | Freq: Four times a day (QID) | ORAL | Status: DC | PRN

## 2015-12-22 MED ORDER — OXYCODONE HCL 5 MG PO TABS
5.0000 mg | ORAL_TABLET | ORAL | Status: DC | PRN
  Administered 2015-12-23 – 2015-12-24 (×5): 10 mg via ORAL
  Filled 2015-12-22 (×5): qty 2

## 2015-12-22 MED ORDER — ONDANSETRON HCL 4 MG PO TABS: 4.0000 mg | ORAL_TABLET | Freq: Four times a day (QID) | ORAL | Status: DC | PRN

## 2015-12-22 MED ORDER — ACETAMINOPHEN 650 MG RE SUPP: 650.0000 mg | Freq: Four times a day (QID) | RECTAL | Status: DC | PRN

## 2015-12-22 MED ORDER — IPRATROPIUM-ALBUTEROL 0.5-2.5 (3) MG/3ML IN SOLN
3.0000 mL | Freq: Once | RESPIRATORY_TRACT | Status: AC
  Administered 2015-12-22: 3 mL via RESPIRATORY_TRACT
  Filled 2015-12-22: qty 3

## 2015-12-22 MED ORDER — ONDANSETRON HCL 4 MG/2ML IJ SOLN
INTRAMUSCULAR | Status: AC
  Filled 2015-12-22: qty 2

## 2015-12-22 MED ORDER — MORPHINE SULFATE (CONCENTRATE) 10 MG/0.5ML PO SOLN
10.0000 mg | ORAL | Status: DC | PRN
  Administered 2015-12-22 – 2015-12-24 (×3): 10 mg via ORAL
  Filled 2015-12-22 (×3): qty 1

## 2015-12-22 MED ORDER — ALBUTEROL SULFATE (2.5 MG/3ML) 0.083% IN NEBU: 3.0000 mL | INHALATION_SOLUTION | Freq: Four times a day (QID) | RESPIRATORY_TRACT | Status: DC | PRN

## 2015-12-22 MED ORDER — ADULT MULTIVITAMIN W/MINERALS CH
1.0000 | ORAL_TABLET | Freq: Every day | ORAL | Status: DC
  Administered 2015-12-23 – 2015-12-25 (×3): 1 via ORAL
  Filled 2015-12-22 (×3): qty 1

## 2015-12-22 MED ORDER — ENOXAPARIN SODIUM 40 MG/0.4ML ~~LOC~~ SOLN
40.0000 mg | SUBCUTANEOUS | Status: DC
  Administered 2015-12-22 – 2015-12-24 (×3): 40 mg via SUBCUTANEOUS
  Filled 2015-12-22 (×3): qty 0.4

## 2015-12-22 MED ORDER — IPRATROPIUM-ALBUTEROL 0.5-2.5 (3) MG/3ML IN SOLN
3.0000 mL | RESPIRATORY_TRACT | Status: DC | PRN
  Administered 2015-12-24: 3 mL via RESPIRATORY_TRACT
  Filled 2015-12-22: qty 3

## 2015-12-22 MED ORDER — HYDROMORPHONE HCL 1 MG/ML IJ SOLN
0.5000 mg | Freq: Once | INTRAMUSCULAR | Status: AC
  Administered 2015-12-22: 0.5 mg via INTRAVENOUS
  Filled 2015-12-22: qty 1

## 2015-12-22 MED ORDER — IOPAMIDOL (ISOVUE-370) INJECTION 76%
75.0000 mL | Freq: Once | INTRAVENOUS | Status: AC | PRN
  Administered 2015-12-22: 75 mL via INTRAVENOUS

## 2015-12-22 MED ORDER — SODIUM CHLORIDE 0.9 % IV SOLN
INTRAVENOUS | Status: DC
  Administered 2015-12-22: 21:00:00 via INTRAVENOUS

## 2015-12-22 NOTE — H&P (Signed)
[place] at [place] NAME: Jacqueline Boyd    MR#:  [id number]  DATE OF BIRTH:  Oct 28, 1940  DATE OF ADMISSION:  12/22/2015  PRIMARY CARE PHYSICIAN: Marinda Elk, MD   REQUESTING/REFERRING PHYSICIAN: Dr. Charlotte Crumb  CHIEF COMPLAINT:   Chief Complaint  Patient presents with  . Shortness of Breath    HISTORY OF PRESENT ILLNESS:  Jacqueline Boyd  is a 75 y.o. female with a known history of CHF, severe COPD, essential hypertension, anxiety, chronic hyponatremia who presents to the hospital due to shortness of breath and profound weakness. Patient says that she has been feeling weak and short of breath now for the past 3-4 days. She was recently admitted to the hospital and discharged not too long ago for treatment of COPD and mild hyponatremia and premature returns back to the hospital with similar symptoms. She admits to cough but is nonproductive. Positive chills but no documented fever, no nausea no vomiting or diarrhea and abdominal pain no chest pain or any other associated symptoms presently. Patient was noted to be mildly hyponatremic again and noted to be Secondary to chronic respiratory failure and hospitalist services were called for treatment evaluation.  PAST MEDICAL HISTORY:   Past Medical History:  Diagnosis Date  . CHF (congestive heart failure) ([place])   . COPD with emphysema ([place])   . Hypertension     PAST SURGICAL HISTORY:   Past Surgical History:  Procedure Laterality Date  . HIP SURGERY Right   . 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 Smoking status: Former Smoker    Quit date: 11/11/1990  . Smokeless tobacco: Not on file  . Alcohol use Yes    FAMILY HISTORY:   Family History  Problem Relation Age of Onset  . Hypertension Other     DRUG ALLERGIES:   Allergies  Allergen Reactions  . Codeine Other (See Comments)    Itching and "feels like  worms crawling in body".  . Penicillins Rash    Has patient had a PCN reaction causing immediate rash, facial/tongue/throat swelling, SOB or lightheadedness with hypotension: No  Has patient had a PCN reaction causing severe rash involving mucus membranes or skin necrosis: No Has patient had a PCN reaction that required hospitalization: No   Has patient had a PCN reaction occurring within the last 10 years: No  If all of the above answers are "NO", then may proceed with Cephalosporin use.   . Prednisone Palpitations    REVIEW OF SYSTEMS:   Review of Systems  Constitutional: Negative for fever and weight loss.  HENT: Negative for congestion, nosebleeds and tinnitus.   Eyes: Negative for blurred vision, double vision and redness.  Respiratory: Positive for cough and shortness of breath. Negative for hemoptysis.   Cardiovascular: Negative for chest pain, orthopnea, leg swelling and PND.  Gastrointestinal: Negative for abdominal pain, diarrhea, melena, nausea and vomiting.  Genitourinary: Negative for dysuria, hematuria and urgency.  Musculoskeletal: Negative for falls and joint pain.  Neurological: Positive for weakness. Negative for dizziness, tingling, sensory change, focal weakness, seizures and headaches.  Endo/Heme/Allergies: Negative for polydipsia. Does not bruise/bleed easily.  Psychiatric/Behavioral: Negative for depression and memory loss. The patient is nervous/anxious.     MEDICATIONS AT HOME:   Prior to Admission medications   Medication Sig Start Date End Date Taking? Authorizing Provider  albuterol (PROVENTIL HFA;VENTOLIN HFA)  108 (90 Base) MCG/ACT inhaler Inhale 1-2 puffs into the lungs every 6 (six) hours as needed for wheezing or shortness of breath.   Yes Historical Provider, MD  ALPRAZolam (XANAX) 0.25 MG tablet Take 1 tablet (0.25 mg total) by mouth 3 (three) times daily as needed for anxiety. 12/12/15  Yes Demetrios Loll, MD  aspirin EC 325 MG tablet Take 325 mg by mouth  daily.   Yes Historical Provider, MD  furosemide (LASIX) 40 MG tablet Take 80 mg by mouth daily.   Yes Historical Provider, MD  Multiple Vitamin (MULTIVITAMIN WITH MINERALS) TABS tablet Take 1 tablet by mouth daily.   Yes Historical Provider, MD  oxyCODONE (OXY IR/ROXICODONE) 5 MG immediate release tablet Take 5-10 mg by mouth every 4 (four) hours as needed for moderate pain or severe pain.   Yes Historical Provider, MD  acetaminophen (TYLENOL) 500 MG tablet Take 1,000 mg by mouth every 8 (eight) hours as needed.    Historical Provider, MD  B Complex-C (B-COMPLEX WITH VITAMIN C) tablet Take 1 tablet by mouth daily.    Historical Provider, MD  docusate sodium (COLACE) 100 MG capsule Take 100 mg by mouth 2 (two) times daily as needed for mild constipation.    Historical Provider, MD  magnesium hydroxide (MILK OF MAGNESIA) 400 MG/5ML suspension Take 30 mLs by mouth every 4 (four) hours as needed for mild constipation.    Historical Provider, MD  ondansetron (ZOFRAN) 4 MG tablet Take 4 mg by mouth every 6 (six) hours as needed for nausea or vomiting.    Historical Provider, MD      VITAL SIGNS:  Blood pressure 112/64, pulse 99, temperature 97.6 F (36.4 C), temperature source Oral, resp. rate 11, height 5\' 8"  (1.727 m), weight 46.7 kg (103 lb), SpO2 93 %.  PHYSICAL EXAMINATION:  Physical Exam  GENERAL:  75 y.o.-year-old patient lying in the bed and appears to be anxious.  EYES: Pupils equal, round, reactive to light and accommodation. No scleral icterus. Extraocular muscles intact.  HEENT: Head atraumatic, normocephalic. Oropharynx and nasopharynx clear. No oropharyngeal erythema, moist oral mucosa  NECK:  Supple, no jugular venous distention. No thyroid enlargement, no tenderness.  LUNGS: Normal breath sounds bilaterally, no wheezing, rales, rhonchi. No use of accessory muscles of respiration.  CARDIOVASCULAR: S1, S2 RRR, Tachycardic. No murmurs, rubs, gallops, clicks.  ABDOMEN: Soft,  nontender, nondistended. Bowel sounds present. No organomegaly or mass.  EXTREMITIES: No pedal edema, cyanosis, or clubbing. + 2 pedal & radial pulses b/l.   NEUROLOGIC: Cranial nerves II through XII are intact. No focal Motor or sensory deficits appreciated b/l. Globally weak.  PSYCHIATRIC: The patient is alert and oriented x 3. Anxious.  SKIN: No obvious rash, lesion, or ulcer.   LABORATORY PANEL:   CBC  Recent Labs Lab 12/22/15 1154  WBC 6.4  HGB 10.1*  HCT 30.7*  PLT 531*   ------------------------------------------------------------------------------------------------------------------  Chemistries   Recent Labs Lab 12/22/15 1154  NA 126*  K 4.3  CL 92*  CO2 22  GLUCOSE 99  BUN 26*  CREATININE 0.77  CALCIUM 9.4  AST 24  ALT 12*  ALKPHOS 111  BILITOT 0.3   ------------------------------------------------------------------------------------------------------------------  Cardiac Enzymes  Recent Labs Lab 12/22/15 1154  TROPONINI <0.03   ------------------------------------------------------------------------------------------------------------------  RADIOLOGY:  Dg Chest 2 View  Result Date: 12/22/2015 CLINICAL DATA:  Weakness and nausea. EXAM: CHEST  2 VIEW COMPARISON:  12/09/2015 FINDINGS: Cardiomediastinal silhouette is normal. Mediastinal contours appear intact. Calcific atherosclerotic disease of the aortic  arch noted. There is no evidence of focal airspace consolidation, pleural effusion or pneumothorax. Lungs are hyperinflated. Osseous structures are without acute abnormality. Soft tissues are grossly normal. IMPRESSION: No active cardiopulmonary disease. Atherosclerotic disease of the aorta. Chronic hyperinflation of the lungs. Electronically Signed   By: Fidela Salisbury M.D.   On: 12/22/2015 13:00  Ct Angio Chest Pe W And/or Wo Contrast  Result Date: 12/22/2015 CLINICAL DATA:  75 year old female with shortness of breath for 3 days. Recent hip  replacement. EXAM: CT ANGIOGRAPHY CHEST WITH CONTRAST TECHNIQUE: Multidetector CT imaging of the chest was performed using the standard protocol during bolus administration of intravenous contrast. Multiplanar CT image reconstructions and MIPs were obtained to evaluate the vascular anatomy. CONTRAST:  75 cc intravenous Isovue 370 COMPARISON:  12/22/2015 and prior radiographs.  08/01/2012 chest CT FINDINGS: Cardiovascular: This is a technically satisfactory study. No pulmonary emboli are identified. Thoracic aortic atherosclerotic calcifications noted without aneurysm or definite dissection. Cardiomegaly and coronary artery calcifications are present. Mediastinum/Nodes: No mediastinal mass, enlarged lymph nodes or pericardial effusion. Lungs/Pleura: Mild centrilobular emphysema noted. There is no evidence of airspace disease, consolidation, mass, suspicious nodule, pleural effusion or pneumothorax. Minimal bibasilar and right upper lobe scarring noted. Upper Abdomen: No acute abnormality. Musculoskeletal: No acute or suspicious abnormalities identified. Review of the MIP images confirms the above findings. IMPRESSION: No acute abnormality. No evidence of pulmonary emboli or thoracic aortic aneurysm/definite dissection. Cardiomegaly and coronary disease. Mild centrilobular emphysema. Electronically Signed   By: Margarette Canada M.D.   On: 12/22/2015 17:05    IMPRESSION AND PLAN:   75 year old female with past medical history of CHF, severe COPD, anxiety, chronic hyponatremia presents to the hospital due to shortness of breath, weakness.  1. Generalized weakness/shortness of breath-this is multifactorial in nature related to underlying severe COPD, GERD with mild underlying hyponatremia. -She does not have an acute COPD exacerbation and therefore will continue her maintenance meds. Give gentle IV fluids for her hyponatremia. I will get a physical therapy consult to assess mobility.  2. COPD - no acute  exacerbation.  - cont. PRN duonebs.  - will add some Roxanol for air hunger.   3. CHF - clinically pt. Is not in CHF.  - hold lasix given hyponatremia and will monitor.  - cont. Gentle IV fludis.   4. Hyponatremia - hypovolemic/Hypotonic in nature.  - cont. Gentle IV fluids and will monitor.   5. Chronic Pain - cont. Oxycodone.   6. Anxiey - cont. PRN Xanax.   Palliative Care consult to discuss goals of care.   All the records are reviewed and case discussed with ED provider. Management plans discussed with the patient, family and they are in agreement.  CODE STATUS: Full   TOTAL TIME TAKING CARE OF THIS PATIENT: 45 minutes.    Henreitta Leber M.D on 12/22/2015 at 6:44 PM  Between 7am to 6pm - Pager - [phone]  After 6pm go to [email or web address] - password [email or web address] [facility]  [place] [facility]  Office  [phone]  CC: Primary care physician; Marinda Elk, MD

## 2015-12-22 NOTE — ED Triage Notes (Signed)
Pt has had shortness of breath last 3 days. Pt was discharged from hospital about 10 days ago for skin infection and dehydration. Pt labored in triage. Legs wrapped for skin wounds. Clear productive cough.

## 2015-12-22 NOTE — ED Notes (Addendum)
Foam dressing applied to sacrum due to previous dressing rolled up and uncomfortable for the patient.  Pt also given water to drink. Wounds to lower legs already wrapped with gauze.

## 2015-12-22 NOTE — ED Notes (Signed)
Patient states that she has a headache that started 2 minutes ago.

## 2015-12-22 NOTE — ED Notes (Signed)
Patient transported to CT 

## 2015-12-22 NOTE — ED Notes (Signed)
Returned from CT.

## 2015-12-22 NOTE — ED Provider Notes (Signed)
[facility] Emergency Department Provider Note   ____________________________________________   None    (approximate)  I have reviewed the triage vital signs and the nursing notes.   HISTORY  Chief Complaint Shortness of Breath    HPI Jacqueline Boyd is a 75 y.o. female with history of CHF, COPD, hypertension who presents for evaluation of 3-4 days of generalized weakness as well as worsening shortness of breath, gradual onset, constant, worse with exertion, currently severe. No chest pain or fever. She has had cough and is concerned she might be developing pneumonia. No vomiting, diarrhea, fevers or chills. She was recently discharged from [facility] after being treated for weakness related to hyponatremia. She reports "I feel exactly the same as before".   Past Medical History:  Diagnosis Date  . CHF (congestive heart failure) ([place])   . COPD with emphysema ([place])   . Hypertension     Patient Active Problem List   Diagnosis Date Noted  . Protein-calorie malnutrition, severe 12/11/2015  . Pressure ulcer 12/10/2015  . Hyponatremia 12/09/2015  . Chronic diastolic heart failure ([place]) 11/08/2015  . Pulmonary hypertension ([place]) 11/08/2015  . Degenerative arthritis of hip 11/06/2015  . 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 Degeneration of intervertebral disc of lumbar region 05/09/2015  . Neuritis or radiculitis due to rupture of lumbar intervertebral disc 05/09/2015  . LBP (low back pain) 12/27/2014  . Coxitis 02/03/2014  . Chronic obstructive pulmonary disease ([place]) 05/04/2012  . Essential (primary) hypertension 05/04/2012    Past Surgical History:  Procedure Laterality Date  . HIP SURGERY Right   . 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 Authorizing Provider  acetaminophen (TYLENOL) 500 MG tablet Take 1,000 mg by mouth every 8 (eight) hours as  needed.    Historical Provider, MD  albuterol (PROVENTIL HFA;VENTOLIN HFA) 108 (90 Base) MCG/ACT inhaler Inhale 1-2 puffs into the lungs every 6 (six) hours as needed for wheezing or shortness of breath.    Historical Provider, MD  ALPRAZolam Duanne Moron) 0.25 MG tablet Take 1 tablet (0.25 mg total) by mouth 3 (three) times daily as needed for anxiety. 12/12/15   Demetrios Loll, MD  aspirin EC 325 MG tablet Take 325 mg by mouth daily.    Historical Provider, MD  B Complex-C (B-COMPLEX WITH VITAMIN C) tablet Take 1 tablet by mouth daily.    Historical Provider, MD  docusate sodium (COLACE) 100 MG capsule Take 100 mg by mouth 2 (two) times daily as needed for mild constipation.    Historical Provider, MD  magnesium hydroxide (MILK OF MAGNESIA) 400 MG/5ML suspension Take 30 mLs by mouth every 4 (four) hours as needed for mild constipation.    Historical Provider, MD  Multiple Vitamin (MULTIVITAMIN WITH MINERALS) TABS tablet Take 1 tablet by mouth daily.    Historical Provider, MD  ondansetron (ZOFRAN) 4 MG tablet Take 4 mg by mouth every 6 (six) hours as needed for nausea or vomiting.    Historical Provider, MD  oxyCODONE (OXY IR/ROXICODONE) 5 MG immediate release tablet Take 5-10 mg by mouth every 4 (four) hours as needed for moderate pain or severe pain.    Historical Provider, MD    Allergies Codeine; Penicillins; and Prednisone  Family History  Problem Relation Age of Onset  . Hypertension Other     Social History Social History  Substance Use Topics  . Smoking  status: Former Smoker    Quit date: 11/11/1990  . Smokeless tobacco: Not on file  . Alcohol use Yes    Review of Systems Constitutional: No fever/chills Eyes: No visual changes. ENT: No sore throat. Cardiovascular: Denies chest pain. Respiratory: + shortness of breath. Gastrointestinal: No abdominal pain.  No nausea, no vomiting.  No diarrhea.  No constipation. Genitourinary: Negative for dysuria. Musculoskeletal: Negative for back  pain. Skin: Negative for rash. Neurological: Negative for headaches, focal weakness or numbness.  10-point ROS otherwise negative.  ____________________________________________   PHYSICAL EXAM:  VITAL SIGNS: ED Triage Vitals  Enc Vitals Group     BP 12/22/15 1406 109/66     Pulse Rate 12/22/15 1406 96     Resp 12/22/15 1151 (!) 26     Temp 12/22/15 1151 98.4 F (36.9 C)     Temp Source 12/22/15 1151 Oral     SpO2 12/22/15 1151 99 %     Weight 12/22/15 1151 103 lb (46.7 kg)     Height 12/22/15 1151 5\' 8"  (1.727 m)     Head Circumference --      Peak Flow --      Pain Score --      Pain Loc --      Pain Edu? --      Excl. in [place]? --     Constitutional: Alert and oriented. Appears motley uncomfortable, mildly tachypneic with slightly increased work of breathing. Eyes: Conjunctivae are normal. PERRL. EOMI. Head: Atraumatic. Nose: No congestion/rhinnorhea. Mouth/Throat: Mucous membranes are moist.  Oropharynx non-erythematous. Neck: No stridor. Supple without meningismus. Cardiovascular: Normal rate, regular rhythm. Grossly normal heart sounds.  Good peripheral circulation. Respiratory: Tachypnea with mildly increased work of breathing but able to speak in short sentences. Diminished breath sounds throughout all lung fields, poor air movement. Gastrointestinal: Soft and nontender. No distention. No CVA tenderness. Genitourinary: deferred Musculoskeletal: Chronic bilateral lower extremity ulcers. Neurologic:  Normal speech and language. No gross focal neurologic deficits are appreciated.  Skin:  Skin is warm, dry and intact. No rash noted. Psychiatric: Mood and affect are normal. Speech and behavior are normal.  ____________________________________________   LABS (all labs ordered are listed, but only abnormal results are displayed)  Labs Reviewed  CBC - Abnormal; Notable for the following:       Result Value   RBC 3.61 (*)    Hemoglobin 10.1 (*)    HCT 30.7 (*)     RDW 15.4 (*)    Platelets 531 (*)    All other components within normal limits  COMPREHENSIVE METABOLIC PANEL - Abnormal; Notable for the following:    Sodium 126 (*)    Chloride 92 (*)    BUN 26 (*)    ALT 12 (*)    All other components within normal limits  TROPONIN I  BRAIN NATRIURETIC PEPTIDE  URINALYSIS COMPLETEWITH MICROSCOPIC ([facility] ONLY)   ____________________________________________  EKG  ED ECG REPORT I, Joanne Gavel, the attending physician, personally viewed and interpreted this ECG.   Date: 12/22/2015  EKG Time: 11:55  Rate: 100  Rhythm: normal sinus rhythm  Axis: Left axis.  Intervals:none  ST&T Change: No acute ST elevation or acute ST depression. Left bundle branch block.  ____________________________________________  RADIOLOGY  CXR IMPRESSION: No active cardiopulmonary disease. Atherosclerotic disease of the aorta. Chronic hyperinflation of the lungs.  ____________________________________________   PROCEDURES  Procedure(s) performed: None  Procedures  Critical Care performed: No  ____________________________________________   INITIAL IMPRESSION / ASSESSMENT AND  PLAN / ED COURSE  Pertinent labs & imaging results that were available during my care of the patient were reviewed by me and considered in my medical decision making (see chart for details).  Jacqueline Boyd is a 75 y.o. female with history of CHF, COPD, hypertension who presents for evaluation of 3-4 days of generalized weakness as well as worsening shortness of breath. On exam, she is uncomfortable appearing, mildly tachypneic with increased work of breathing and globally diminished air movement. EKG shows left bundle branch block. We will obtain screening labs, chest x-ray, give nebulizer treatments and reassess. Se does not want to take steroids because they "make me feel worse and my heart races".  ----------------------------------------- 3:53 PM on  12/22/2015 -----------------------------------------  Labs show recurrence of hyponatremia, sodium 126 today, down from 133 just 10 days ago and I suspect that that is the mostly because of the patient's weakness, we'll give IV fluids. Troponin negative, BNP is not elevated, see BEC is generally unchanged from prior. Chest x-ray shows no active cardio primary disease. After 2 DuoNeb treatments, patient has slightly increased air movement but still tachypneic with increased work of breathing and given the fact that she had a hip replacement 1 month ago, she is at increased risk for PE I have ordered CTA chest to evaluate for PE. In any case, she will require admission for recurrent hyponatremia with weakness. Care transferred to Dr. Burlene Arnt at this time.  Clinical Course     ____________________________________________   FINAL CLINICAL IMPRESSION(S) / ED DIAGNOSES  Final diagnoses:  SOB (shortness of breath)  Weakness  Hyponatremia      NEW MEDICATIONS STARTED DURING THIS VISIT:  New Prescriptions   No medications on file     Note:  This document was prepared using Dragon voice recognition software and may include unintentional dictation errors.    Joanne Gavel, MD 12/22/15 [phone]

## 2015-12-22 NOTE — ED Notes (Signed)
Patient states that she has had increased shortness of breath for the past 3 days. Patient denies cough, fever, or chest pains but states that she has had chills. Patient was recently hospitalized for dehydration and low sodium. Patient was discharged about 10 days ago.

## 2015-12-22 NOTE — ED Triage Notes (Signed)
Daughter reports pt has been yellow over last week

## 2015-12-23 LAB — CBC
HCT: 23.7 % — ABNORMAL LOW (ref 35.0–47.0)
Hemoglobin: 7.9 g/dL — ABNORMAL LOW (ref 12.0–16.0)
MCH: 28 pg (ref 26.0–34.0)
MCHC: 33.1 g/dL (ref 32.0–36.0)
MCV: 84.5 fL (ref 80.0–100.0)
PLATELETS: 412 10*3/uL (ref 150–440)
RBC: 2.81 MIL/uL — AB (ref 3.80–5.20)
RDW: 15.3 % — AB (ref 11.5–14.5)
WBC: 5.2 10*3/uL (ref 3.6–11.0)

## 2015-12-23 LAB — BASIC METABOLIC PANEL
Anion gap: 7 (ref 5–15)
BUN: 27 mg/dL — AB (ref 6–20)
CALCIUM: 8.4 mg/dL — AB (ref 8.9–10.3)
CHLORIDE: 98 mmol/L — AB (ref 101–111)
CO2: 26 mmol/L (ref 22–32)
CREATININE: 0.88 mg/dL (ref 0.44–1.00)
Glucose, Bld: 97 mg/dL (ref 65–99)
Potassium: 4.5 mmol/L (ref 3.5–5.1)
SODIUM: 131 mmol/L — AB (ref 135–145)

## 2015-12-23 LAB — GLUCOSE, CAPILLARY
GLUCOSE-CAPILLARY: 69 mg/dL (ref 65–99)
GLUCOSE-CAPILLARY: 87 mg/dL (ref 65–99)
GLUCOSE-CAPILLARY: 83 mg/dL (ref 65–99)
Glucose-Capillary: 76 mg/dL (ref 65–99)

## 2015-12-23 MED ORDER — MIRTAZAPINE 15 MG PO TABS
15.0000 mg | ORAL_TABLET | Freq: Every day | ORAL | Status: DC
  Administered 2015-12-23 – 2015-12-24 (×2): 15 mg via ORAL
  Filled 2015-12-23 (×2): qty 1

## 2015-12-23 MED ORDER — SODIUM CHLORIDE 0.9 % IV BOLUS (SEPSIS)
500.0000 mL | Freq: Once | INTRAVENOUS | Status: AC
  Administered 2015-12-23: 14:00:00 500 mL via INTRAVENOUS

## 2015-12-23 MED ORDER — DIPHENHYDRAMINE HCL 25 MG PO CAPS
25.0000 mg | ORAL_CAPSULE | Freq: Four times a day (QID) | ORAL | Status: DC | PRN
  Administered 2015-12-23 (×3): 25 mg via ORAL
  Filled 2015-12-23 (×3): qty 1

## 2015-12-23 NOTE — Care Management Obs Status (Signed)
[place] NOTIFICATION   Patient Details  Name: Jacqueline Boyd MRN: [id number] Date of Birth: October 27, 1940   Medicare Observation Status Notification Given:  Yes  MOON letter was reviewed with [name] who expressed dissatisfaction that no one informed her that she was being admitted to an Observation bed instead of an Inpatient bed. [name] kept an unsigned copy of the [facility] form and the other unsigned copy was faxed to [facility].      Tyvion Edmondson A, RN 12/23/2015, 1:41 PM

## 2015-12-23 NOTE — Consult Note (Signed)
[facility] Nurse wound consult note Reason for Consult: Patient known to me from last admission; seen approximately 2 weeks ago on 12/10/15. Wounds have improved since that visit with the care of the [facility]. Reassessment of R medial ME, right lateral LE, left lateral thigh is nearly reepithelialized and new traumatic injury on right medial thigh. Pressure injury, Stage 3 on left side of gluteal cleft and a moisture lesion in the distal gluteal cleft. Wound type: Traumatic, pressure Pressure Ulcer POA: Yes Measurement: Right medial LE measures 10cm x 3.5cm x 0.1cm (full thickness), healing wound bed (pink) with small amount of serous exudate. Right lateral LE full thickness wound measuring 3cm round and nearly reepithelialized. Left lateral thigh with near complete reepithelialization over area previously measuring 3cm x 2cm, now has no depth. Left gluteal cleft Stage 3 pressure Injury measures 2cm x 1.5cm with 100% pink wound bed. New area on the right medial thigh is ecchymotic with two skin tears measuring 2cm each.  Both are closely, but not completely approximated and present with a moist red wound bed and serous exudate. Wound bed: Drainage (amount, consistency, odor) As described above Periwound:intact, dry Dressing procedure/placement/frequency: Patient is weak, has difficulty breathing unless in a high-Fowler's position (>45 degree angle) and takes in less than sufficient nutrition. I will provide a therapeutic mattress with low air loss feature today for pressure redistribution and management of her high risk condition.  Turning and repositioning is still of utmost importance. I will continue the wound care orders initiated two weeks ago while in house as they have been effective. [place] nursing team will not follow, but will remain available to this patient, the nursing and medical teams.  Please re-consult if needed. Thanks, Maudie Flakes, MSN, RN, [place], Arther Abbott  Pager# [phone]

## 2015-12-23 NOTE — Progress Notes (Signed)
   12/23/15 1330  Vitals  Temp 97.8 F (36.6 C)  Temp Source Oral  BP (!) 89/45  MAP (mmHg) (!) 53  BP Location Left Arm  BP Method Automatic  Patient Position (if appropriate) Lying  Pulse Rate 85  Pulse Rate Source Monitor  Resp 18  Oxygen Therapy  SpO2 97 %  O2 Device Room Air   Made Dr. Lavetta Nielsen aware pt's current BP and low CBG earlier.  New orders received.  Clarise Cruz, RN

## 2015-12-23 NOTE — Plan of Care (Signed)
Problem: Skin Integrity: Goal: Risk for impaired skin integrity will decrease Outcome: Not Progressing Patient with stage III to coccyx/sacral and multiple non healing wounds to BLE covered with dry gauze dressings. Coccyx/sacral area cleaned and pink foam dressing applied. Patient instructed to try and turn to keep pressure off of coccyx and to keep head of bed reclined to reduce pressure to coccyx area. Patient states that dressings were changed prior to coming to hospital yesterday, all dressings reapplied per patient request.

## 2015-12-23 NOTE — Progress Notes (Signed)
[facility] [place] NAME: Jacqueline Boyd    MRN#:  [id number]  DATE OF BIRTH:  12-12-1940  SUBJECTIVE:  Hospital Day: 0 days Jacqueline Boyd is a 75 y.o. female presenting with Shortness of Breath .   Overnight events: No overnight events Interval Events: Complains of weakness, fatigue, shortness of breath with activity which is chronic. Also has complaints of leg pain and pain on buttock around wounds 4/10 described only as pain nonradiating no worsening or relieving factors  REVIEW OF SYSTEMS:  CONSTITUTIONAL: No fever,Positive fatigue or weakness.  EYES: No blurred or double vision.  EARS, NOSE, AND THROAT: No tinnitus or ear pain.  RESPIRATORY: No cough, shortness of breath, wheezing or hemoptysis.  CARDIOVASCULAR: No chest pain, orthopnea, edema.  GASTROINTESTINAL: No nausea, vomiting, diarrhea or abdominal pain.  GENITOURINARY: No dysuria, hematuria.  ENDOCRINE: No polyuria, nocturia,  HEMATOLOGY: No anemia, easy bruising or bleeding SKIN: Bilateral wounds currently dressed otherwise No rash or lesion. MUSCULOSKELETAL: No joint pain or arthritis.   NEUROLOGIC: No tingling, numbness, weakness.  PSYCHIATRY: No anxiety or depression.   DRUG ALLERGIES:   Allergies  Allergen Reactions  . Codeine Other (See Comments)    Itching and "feels like worms crawling in body".  . Penicillins Rash    Has patient had a PCN reaction causing immediate rash, facial/tongue/throat swelling, SOB or lightheadedness with hypotension: No  Has patient had a PCN reaction causing severe rash involving mucus membranes or skin necrosis: No Has patient had a PCN reaction that required hospitalization: No   Has patient had a PCN reaction occurring within the last 10 years: No  If all of the above answers are "NO", then may proceed with Cephalosporin use.   . Prednisone Palpitations    VITALS:  Blood pressure (!) 97/50, pulse 87, temperature 97.8 F  (36.6 C), temperature source Oral, resp. rate 20, height 5\' 8"  (1.727 m), weight 98 lb (44.5 kg), SpO2 100 %.  PHYSICAL EXAMINATION:  VITAL SIGNS: Vitals:   12/22/15 2102 12/23/15 0435  BP: (!) 103/56 (!) 97/50  Pulse: 93 87  Resp: 20 20  Temp: 98.1 F (36.7 C) 97.8 F (36.6 C)   GENERAL:74 y.o.female currently in no acute distress. Week/frail HEAD: Normocephalic, atraumatic.  EYES: Pupils equal, round, reactive to light. Extraocular muscles intact. No scleral icterus.  MOUTH: Moist mucosal membrane. Dentition intact. No abscess noted.  EAR, NOSE, THROAT: Clear without exudates. No external lesions.  NECK: Supple. No thyromegaly. No nodules. No JVD.  PULMONARY: Clear to ascultation, without wheeze rails or rhonci. No use of accessory muscles, Good respiratory effort. good air entry bilaterally CHEST: Nontender to palpation.  CARDIOVASCULAR: S1 and S2. Regular rate and rhythm. No murmurs, rubs, or gallops. No edema. Pedal pulses 2+ bilaterally.  GASTROINTESTINAL: Soft, nontender, nondistended. No masses. Positive bowel sounds. No hepatosplenomegaly.  MUSCULOSKELETAL: No swelling, clubbing, or edema. Range of motion full in all extremities.  NEUROLOGIC: Cranial nerves II through XII are intact. No gross focal neurological deficits. Sensation intact. Reflexes intact.  SKIN: Bilateral lower extremity dressing clean dressing intact, dressing over sacral decubitus ulcer clean dry and intact otherwise No ulceration, lesions, rashes, or cyanosis. Skin warm and dry. Turgor intact.  PSYCHIATRIC: Mood, affect within normal limits. The patient is awake, alert and oriented x 3. Insight, judgment intact.      LABORATORY PANEL:   CBC  Recent Labs Lab 12/23/15 0444  WBC 5.2  HGB 7.9*  HCT 23.7*  PLT 412   ------------------------------------------------------------------------------------------------------------------  Chemistries   Recent Labs Lab 12/22/15 1154 12/23/15 0444  NA  126* 131*  K 4.3 4.5  CL 92* 98*  CO2 22 26  GLUCOSE 99 97  BUN 26* 27*  CREATININE 0.77 0.88  CALCIUM 9.4 8.4*  AST 24  --   ALT 12*  --   ALKPHOS 111  --   BILITOT 0.3  --    ------------------------------------------------------------------------------------------------------------------  Cardiac Enzymes  Recent Labs Lab 12/22/15 1154  TROPONINI <0.03   ------------------------------------------------------------------------------------------------------------------  RADIOLOGY:  Dg Chest 2 View  Result Date: 12/22/2015 CLINICAL DATA:  Weakness and nausea. EXAM: CHEST  2 VIEW COMPARISON:  12/09/2015 FINDINGS: Cardiomediastinal silhouette is normal. Mediastinal contours appear intact. Calcific atherosclerotic disease of the aortic arch noted. There is no evidence of focal airspace consolidation, pleural effusion or pneumothorax. Lungs are hyperinflated. Osseous structures are without acute abnormality. Soft tissues are grossly normal. IMPRESSION: No active cardiopulmonary disease. Atherosclerotic disease of the aorta. Chronic hyperinflation of the lungs. Electronically Signed   By: Fidela Salisbury M.D.   On: 12/22/2015 13:00  Ct Angio Chest Pe W And/or Wo Contrast  Result Date: 12/22/2015 CLINICAL DATA:  75 year old female with shortness of breath for 3 days. Recent hip replacement. EXAM: CT ANGIOGRAPHY CHEST WITH CONTRAST TECHNIQUE: Multidetector CT imaging of the chest was performed using the standard protocol during bolus administration of intravenous contrast. Multiplanar CT image reconstructions and MIPs were obtained to evaluate the vascular anatomy. CONTRAST:  75 cc intravenous Isovue 370 COMPARISON:  12/22/2015 and prior radiographs.  08/01/2012 chest CT FINDINGS: Cardiovascular: This is a technically satisfactory study. No pulmonary emboli are identified. Thoracic aortic atherosclerotic calcifications noted without aneurysm or definite dissection. Cardiomegaly and  coronary artery calcifications are present. Mediastinum/Nodes: No mediastinal mass, enlarged lymph nodes or pericardial effusion. Lungs/Pleura: Mild centrilobular emphysema noted. There is no evidence of airspace disease, consolidation, mass, suspicious nodule, pleural effusion or pneumothorax. Minimal bibasilar and right upper lobe scarring noted. Upper Abdomen: No acute abnormality. Musculoskeletal: No acute or suspicious abnormalities identified. Review of the MIP images confirms the above findings. IMPRESSION: No acute abnormality. No evidence of pulmonary emboli or thoracic aortic aneurysm/definite dissection. Cardiomegaly and coronary disease. Mild centrilobular emphysema. Electronically Signed   By: Margarette Canada M.D.   On: 12/22/2015 17:05   EKG:   Orders placed or performed during the hospital encounter of 12/22/15  . ED EKG within 10 minutes  . ED EKG within 10 minutes  . EKG 12-Lead  . EKG 12-Lead    ASSESSMENT AND PLAN:   Jacqueline Boyd is a 75 y.o. female presenting with Shortness of Breath . Admitted 12/22/2015 : Day #: 66 days   75 year old female with past medical history of CHF, severe COPD, anxiety, chronic hyponatremia presents to the hospital due to shortness of breath, weakness.  1. Generalized weakness/shortness of breath-this is multifactorial, not COPD exacerbation and therefore will continue her maintenance meds.physical therapy consult to assess mobility.  2. COPD - no acute exacerbation.  - cont. PRN duonebs.  - will add some Roxanol for air hunger.   3. CHF - clinically pt. Is not in CHF.  - hold lasix given hyponatremia and will monitor.  - cont. Gentle IV fludis.   4. Hyponatremia - hold IVF, resolved   5. Chronic Pain - cont. Oxycodone.   6. Anxiey - cont. PRN Xanax.   Palliative Care consult to discuss goals of care.    All the records are reviewed and case  discussed with Care Management/Social Workerr. Management plans discussed with the  patient, family and they are in agreement.  CODE STATUS: full TOTAL TIME TAKING CARE OF THIS PATIENT: 28 minutes.   POSSIBLE D/C IN 1-2DAYS, DEPENDING ON CLINICAL CONDITION.   [place],  Karenann Cai.D on 12/23/2015 at 11:32 AM  Between 7am to 6pm - Pager - [phone]  After 6pm: House Pager: - [place] [facility]  Office  [phone]  CC: Primary care physician; Marinda Elk, MD

## 2015-12-23 NOTE — Progress Notes (Signed)
Patient admitted with hyponatremia. IVF infusing per order. Pt has multiple non healing wounds to BLE covered with vasoline gauze and then wrapped with kurlex. Pt states that she has a home health RN that comes and changes the dressings, no wound consult ordered. Will advise day RN to ask rounding MD about wound care consult. Pt c/o left hip pain some improvement noted with PRN PO pain medications. Pt also c/o anxiety improves with PRN antianxiety medications. High Fall risk. Palliative care consult ordered. Pt voiding using bed pan and states last BM was 12/22/2015.

## 2015-12-23 NOTE — Progress Notes (Signed)
PT Cancellation Note  Patient Details Name: Jacqueline Boyd MRN: [id number] DOB: 10/13/1940   Cancelled Treatment:    Reason Eval/Treat Not Completed: Other (comment) (Consult received and chart reviewed.  Evaluation attempted--patient currently refusing due to weakness, fatigue; unable to redirect despite encouragement.  Will re-attempt at later time/date as able.)   Tsuneo Faison H. Owens Shark, PT, DPT, NCS 12/23/15, 10:01 AM [phone]

## 2015-12-24 LAB — BASIC METABOLIC PANEL
ANION GAP: 6 (ref 5–15)
BUN: 18 mg/dL (ref 6–20)
CALCIUM: 8.8 mg/dL — AB (ref 8.9–10.3)
CHLORIDE: 104 mmol/L (ref 101–111)
CO2: 25 mmol/L (ref 22–32)
Creatinine, Ser: 0.67 mg/dL (ref 0.44–1.00)
GFR calc Af Amer: >60 mL/min (ref >60)
GFR calc non Af Amer: >60 mL/min (ref >60)
GLUCOSE: 106 mg/dL — AB (ref 65–99)
Potassium: 5.3 mmol/L — ABNORMAL HIGH (ref 3.5–5.1)
Sodium: 135 mmol/L (ref 135–145)

## 2015-12-24 LAB — CBC
HCT: 27.5 % — ABNORMAL LOW (ref 35.0–47.0)
HEMOGLOBIN: 9 g/dL — AB (ref 12.0–16.0)
MCH: 28.1 pg (ref 26.0–34.0)
MCHC: 32.6 g/dL (ref 32.0–36.0)
MCV: 86.4 fL (ref 80.0–100.0)
PLATELETS: 456 10*3/uL — AB (ref 150–440)
RBC: 3.19 MIL/uL — AB (ref 3.80–5.20)
RDW: 15.3 % — ABNORMAL HIGH (ref 11.5–14.5)
WBC: 6.7 10*3/uL (ref 3.6–11.0)

## 2015-12-24 LAB — GLUCOSE, CAPILLARY
GLUCOSE-CAPILLARY: 212 mg/dL — AB (ref 65–99)
GLUCOSE-CAPILLARY: 81 mg/dL (ref 65–99)
GLUCOSE-CAPILLARY: 89 mg/dL (ref 65–99)
Glucose-Capillary: 112 mg/dL — ABNORMAL HIGH (ref 65–99)

## 2015-12-24 MED ORDER — MORPHINE SULFATE (PF) 2 MG/ML IV SOLN
1.0000 mg | INTRAVENOUS | Status: DC | PRN
  Administered 2015-12-24 – 2015-12-25 (×5): 1 mg via INTRAVENOUS
  Filled 2015-12-24 (×5): qty 1

## 2015-12-24 MED ORDER — PRO-STAT SUGAR FREE PO LIQD
30.0000 mL | Freq: Two times a day (BID) | ORAL | Status: DC
  Administered 2015-12-24 (×2): 30 mL via ORAL

## 2015-12-24 MED ORDER — OXYCODONE HCL 5 MG PO TABS: 5.0000 mg | ORAL_TABLET | ORAL | Status: DC | PRN

## 2015-12-24 MED ORDER — SODIUM POLYSTYRENE SULFONATE 15 GM/60ML PO SUSP
30.0000 g | Freq: Once | ORAL | Status: AC
  Administered 2015-12-24: 08:00:00 30 g via ORAL
  Filled 2015-12-24: qty 120

## 2015-12-24 NOTE — Progress Notes (Signed)
PT Cancellation Note  Patient Details Name: Jacqueline Boyd MRN: [id number] DOB: 02/22/1941   Cancelled Treatment:    Reason Eval/Treat Not Completed: Pain limiting ability to participate;Other (comment) (PT checked both AM and PM with same complaint of generalized) pain that keeps her from moving well.  Will try again tomorrow.   Ramond Dial 12/24/2015, 2:12 PM    Mee Hives, PT MS Acute Rehab Dept. Number: [place] and [place]

## 2015-12-24 NOTE — Progress Notes (Addendum)
[facility] [place] NAME: Jacqueline Boyd    MRN#:  [id number]  DATE OF BIRTH:  Oct 26, 1940  SUBJECTIVE:  Hospital Day: 0 days Jacqueline Boyd is a 75 y.o. female presenting with Shortness of Breath .   Overnight events: No overnight events Interval Events: Complains of weakness, fatigue, shortness of breath, back pain High potassium this morning  REVIEW OF SYSTEMS:  CONSTITUTIONAL: No fever,Positive fatigue or weakness.  EYES: No blurred or double vision.  EARS, NOSE, AND THROAT: No tinnitus or ear pain.  RESPIRATORY: No cough, shortness of breath, wheezing or hemoptysis.  CARDIOVASCULAR: No chest pain, orthopnea, edema.  GASTROINTESTINAL: No nausea, vomiting, diarrhea or abdominal pain.  GENITOURINARY: No dysuria, hematuria.  ENDOCRINE: No polyuria, nocturia,  HEMATOLOGY: No anemia, easy bruising or bleeding SKIN: Bilateral wounds currently dressed otherwise No rash or lesion. MUSCULOSKELETAL: No joint pain or arthritis.   NEUROLOGIC: No tingling, numbness, weakness.  PSYCHIATRY: No anxiety or depression.   DRUG ALLERGIES:   Allergies  Allergen Reactions  . Codeine Other (See Comments)    Itching and "feels like worms crawling in body".  . Penicillins Rash    Has patient had a PCN reaction causing immediate rash, facial/tongue/throat swelling, SOB or lightheadedness with hypotension: No  Has patient had a PCN reaction causing severe rash involving mucus membranes or skin necrosis: No Has patient had a PCN reaction that required hospitalization: No   Has patient had a PCN reaction occurring within the last 10 years: No  If all of the above answers are "NO", then may proceed with Cephalosporin use.   . Prednisone Palpitations    VITALS:  Blood pressure (!) 150/69, pulse (!) 114, temperature 98.6 F (37 C), temperature source Oral, resp. rate 20, height 5\' 8"  (1.727 m), weight 98 lb (44.5 kg), SpO2 92 %.  PHYSICAL  EXAMINATION:  VITAL SIGNS: Vitals:   12/23/15 2030 12/24/15 0522  BP: 110/62 (!) 150/69  Pulse: 87 (!) 114  Resp:  20  Temp: 98.2 F (36.8 C) 98.6 F (37 C)   GENERAL:74 y.o.female currently in no acute distress. Weak/frail HEAD: Normocephalic, atraumatic.  EYES: Pupils equal, round, reactive to light. Extraocular muscles intact. No scleral icterus.  MOUTH: Moist mucosal membrane. Dentition intact. No abscess noted.  EAR, NOSE, THROAT: Clear without exudates. No external lesions.  NECK: Supple. No thyromegaly. No nodules. No JVD.  PULMONARY: Clear to ascultation, without wheeze rails or rhonci. No use of accessory muscles, Good respiratory effort. good air entry bilaterally CHEST: Nontender to palpation.  CARDIOVASCULAR: S1 and S2. Regular rate and rhythm. No murmurs, rubs, or gallops. No edema. Pedal pulses 2+ bilaterally.  GASTROINTESTINAL: Soft, nontender, nondistended. No masses. Positive bowel sounds. No hepatosplenomegaly.  MUSCULOSKELETAL: No swelling, clubbing, or edema. Range of motion full in all extremities.  NEUROLOGIC: Cranial nerves II through XII are intact. No gross focal neurological deficits. Sensation intact. Reflexes intact.  SKIN: Bilateral lower extremity dressing clean dressing intact, dressing over sacral decubitus ulcer clean dry and intact otherwise No ulceration, lesions, rashes, or cyanosis. Skin warm and dry. Turgor intact.  PSYCHIATRIC: Mood, affect within normal limits. The patient is awake, alert and oriented x 3. Insight, judgment intact.      LABORATORY PANEL:   CBC  Recent Labs Lab 12/24/15 0457  WBC 6.7  HGB 9.0*  HCT 27.5*  PLT 456*   ------------------------------------------------------------------------------------------------------------------  Chemistries   Recent Labs Lab 12/22/15 1154  12/24/15 0455  NA 126*  < >  135  K 4.3  < > 5.3*  CL 92*  < > 104  CO2 22  < > 25  GLUCOSE 99  < > 106*  BUN 26*  < > 18  CREATININE  0.77  < > 0.67  CALCIUM 9.4  < > 8.8*  AST 24  --   --   ALT 12*  --   --   ALKPHOS 111  --   --   BILITOT 0.3  --   --   < > = values in this interval not displayed. ------------------------------------------------------------------------------------------------------------------  Cardiac Enzymes  Recent Labs Lab 12/22/15 1154  TROPONINI <0.03   ------------------------------------------------------------------------------------------------------------------  RADIOLOGY:  Dg Chest 2 View  Result Date: 12/22/2015 CLINICAL DATA:  Weakness and nausea. EXAM: CHEST  2 VIEW COMPARISON:  12/09/2015 FINDINGS: Cardiomediastinal silhouette is normal. Mediastinal contours appear intact. Calcific atherosclerotic disease of the aortic arch noted. There is no evidence of focal airspace consolidation, pleural effusion or pneumothorax. Lungs are hyperinflated. Osseous structures are without acute abnormality. Soft tissues are grossly normal. IMPRESSION: No active cardiopulmonary disease. Atherosclerotic disease of the aorta. Chronic hyperinflation of the lungs. Electronically Signed   By: Fidela Salisbury M.D.   On: 12/22/2015 13:00  Ct Angio Chest Pe W And/or Wo Contrast  Result Date: 12/22/2015 CLINICAL DATA:  75 year old female with shortness of breath for 3 days. Recent hip replacement. EXAM: CT ANGIOGRAPHY CHEST WITH CONTRAST TECHNIQUE: Multidetector CT imaging of the chest was performed using the standard protocol during bolus administration of intravenous contrast. Multiplanar CT image reconstructions and MIPs were obtained to evaluate the vascular anatomy. CONTRAST:  75 cc intravenous Isovue 370 COMPARISON:  12/22/2015 and prior radiographs.  08/01/2012 chest CT FINDINGS: Cardiovascular: This is a technically satisfactory study. No pulmonary emboli are identified. Thoracic aortic atherosclerotic calcifications noted without aneurysm or definite dissection. Cardiomegaly and coronary artery  calcifications are present. Mediastinum/Nodes: No mediastinal mass, enlarged lymph nodes or pericardial effusion. Lungs/Pleura: Mild centrilobular emphysema noted. There is no evidence of airspace disease, consolidation, mass, suspicious nodule, pleural effusion or pneumothorax. Minimal bibasilar and right upper lobe scarring noted. Upper Abdomen: No acute abnormality. Musculoskeletal: No acute or suspicious abnormalities identified. Review of the MIP images confirms the above findings. IMPRESSION: No acute abnormality. No evidence of pulmonary emboli or thoracic aortic aneurysm/definite dissection. Cardiomegaly and coronary disease. Mild centrilobular emphysema. Electronically Signed   By: Margarette Canada M.D.   On: 12/22/2015 17:05   EKG:   Orders placed or performed during the hospital encounter of 12/22/15  . ED EKG within 10 minutes  . ED EKG within 10 minutes  . EKG 12-Lead  . EKG 12-Lead    ASSESSMENT AND PLAN:   Natalin Reinertson is a 75 y.o. female presenting with Shortness of Breath . Admitted 12/22/2015 : Day #: 11 days   76 year old female with past medical history of CHF, severe COPD, anxiety, chronic hyponatremia presents to the hospital due to shortness of breath, weakness.  1. Generalized weakness/shortness of breath-this is multifactorial, not COPD exacerbation and therefore will continue her maintenance meds.physical therapy consult to assess mobility.State importance of adequate diet, Remeron seems to be improving sleep/appetite slightly  2. COPD - no acute exacerbation.  - cont. PRN duonebs.  - will add some Roxanol for air hunger.   3. Hyperkalemia: Kayexalate, follow  4. Hyponatremia - hold IVF, resolved   5. Chronic Pain - cont. Oxycodone.   6. Anxiey - cont. PRN Xanax.   Palliative Care consult to discuss goals  of care.   Patient would likely benefit from placement however there does not appear to be a qualifying medical condition at present  All the records  are reviewed and case discussed with Care Management/Social Workerr. Management plans discussed with the patient, family and they are in agreement.  CODE STATUS: full TOTAL TIME TAKING CARE OF THIS PATIENT: 28 minutes.   POSSIBLE D/C IN 1-2DAYS, DEPENDING ON CLINICAL CONDITION.   Mcarthur Ivins,  Karenann Cai.D on 12/24/2015 at 11:47 AM  Between 7am to 6pm - Pager - [phone]  After 6pm: House Pager: - [phone]  [facility]  Office  [phone]  CC: Primary care physician; Marinda Elk, MD

## 2015-12-25 LAB — GLUCOSE, CAPILLARY
GLUCOSE-CAPILLARY: 129 mg/dL — AB (ref 65–99)
GLUCOSE-CAPILLARY: 101 mg/dL — AB (ref 65–99)
Glucose-Capillary: 105 mg/dL — ABNORMAL HIGH (ref 65–99)

## 2015-12-25 LAB — BASIC METABOLIC PANEL
ANION GAP: 7 (ref 5–15)
BUN: 21 mg/dL — ABNORMAL HIGH (ref 6–20)
CALCIUM: 8.6 mg/dL — AB (ref 8.9–10.3)
CO2: 26 mmol/L (ref 22–32)
Chloride: 102 mmol/L (ref 101–111)
Creatinine, Ser: 0.59 mg/dL (ref 0.44–1.00)
Glucose, Bld: 101 mg/dL — ABNORMAL HIGH (ref 65–99)
POTASSIUM: 4.3 mmol/L (ref 3.5–5.1)
Sodium: 135 mmol/L (ref 135–145)

## 2015-12-25 MED ORDER — ALPRAZOLAM 0.25 MG PO TABS: 0.2500 mg | ORAL_TABLET | Freq: Three times a day (TID) | ORAL | 0 refills | Status: DC | PRN

## 2015-12-25 MED ORDER — MIRTAZAPINE 15 MG PO TABS: 15.0000 mg | ORAL_TABLET | Freq: Every day | ORAL | 0 refills | Status: DC

## 2015-12-25 MED ORDER — PRO-STAT SUGAR FREE PO LIQD: 30.0000 mL | Freq: Two times a day (BID) | ORAL | 0 refills | Status: DC

## 2015-12-25 MED ORDER — OXYCODONE HCL 5 MG PO TABS: 5.0000 mg | ORAL_TABLET | ORAL | 0 refills | Status: DC | PRN

## 2015-12-25 MED ORDER — CYCLOBENZAPRINE HCL 10 MG PO TABS
5.0000 mg | ORAL_TABLET | Freq: Three times a day (TID) | ORAL | Status: DC | PRN
  Administered 2015-12-25: 5 mg via ORAL
  Filled 2015-12-25: qty 1

## 2015-12-25 MED ORDER — CYCLOBENZAPRINE HCL 5 MG PO TABS: 5.0000 mg | ORAL_TABLET | Freq: Three times a day (TID) | ORAL | 0 refills | Status: DC | PRN

## 2015-12-25 NOTE — Clinical Social Work Placement (Signed)
   CLINICAL SOCIAL WORK PLACEMENT  NOTE  Date:  12/25/2015  Patient Details  Name: Jacqueline Boyd MRN: [id number] Date of Birth: Jul 05, 1940  Clinical Social Work is seeking post-discharge placement for this patient at the [place] level of care (*CSW will initial, date and re-position this form in  chart as items are completed):  Yes   Patient/family provided with [facility] Work Department's list of facilities offering this level of care within the geographic area requested by the patient (or if unable, by the patient's family).  Yes   Patient/family informed of their freedom to choose among providers that offer the needed level of care, that participate in Medicare, Medicaid or managed care program needed by the patient, have an available bed and are willing to accept the patient.  Yes   Patient/family informed of [facility]'s ownership interest in [facility] and [facility], as well as of the fact that they are under no obligation to receive care at these facilities.  PASRR submitted to EDS on       PASRR number received on       Existing PASRR number confirmed on 12/25/15     FL2 transmitted to all facilities in geographic area requested by pt/family on 12/25/15     FL2 transmitted to all facilities within larger geographic area on       Patient informed that his/her managed care company has contracts with or will negotiate with certain facilities, including the following:        Yes   Patient/family informed of bed offers received.  Patient chooses bed at  ([facility])     Physician recommends and patient chooses bed at      Patient to be transferred to  ([facility]) on 12/25/15.  Patient to be transferred to facility by  (EMS)     Patient family notified on 12/25/15 of transfer.  Name of family member notified:   Corene Cornea- Son)     PHYSICIAN       Additional Comment:    _______________________________________________ Baldemar Lenis, LCSW 12/25/2015, 3:54 PM

## 2015-12-25 NOTE — Progress Notes (Signed)
CSW attempted to look up patient's PASRR. Patient's PASRR # is under Malissa Hippo. SSN and DOB match.  Ernest Pine, MSW, LCSW, [place] Clinical Social Worker [phone]

## 2015-12-25 NOTE — Clinical Social Work Note (Signed)
Clinical Social Work Assessment  Patient Details  Name: Jacqueline Boyd MRN: [id number] Date of Birth: 1941/03/09  Date of referral:  12/25/15               Reason for consult:  Discharge Planning                Permission sought to share information with:  Family Supports Permission granted to share information::  Yes, Verbal Permission Granted  Name::        Agency::     Relationship::   Corene Cornea- Son)  Sport and exercise psychologist Information:     Housing/Transportation Living arrangements for the past 2 months:  Single Family Home Source of Information:  Patient Patient Interpreter Needed:  None Criminal Activity/Legal Involvement Pertinent to Current Situation/Hospitalization:  No - Comment as needed Significant Relationships:  Adult Children, Other Family Members Lives with:  Self Do you feel safe going back to the place where you live?  Yes Need for family participation in patient care:  Yes (Comment) Corene Cornea- Son)  Care giving concerns:  PT recommends that patient will benefit from SNF placement at discharge.    Social Worker assessment / plan:  CSW is familiar with patient. Placed patient at [facility] earlier this year for STR. CSW met with patient at bedside. Introduced herself and her role. Patient reports that she's open to go to STR at discharge but she does not want to go back to [facility]. Reported that she will be open to going to [facility] or [facility]. Granted CSW verbal permission to send SNF referral to SNFs in [place]. Awaiting bed offers. FL2/ PASRR completed and placed in MDs basket for cosign. CSW will continue to follow and assist.   Employment status:  Retired Nurse, adult PT Recommendations:  [place] / Referral to community resources:  [place]  Patient/Family's Response to care:  Patient is in agreement to go to SNF at discharge. Reported that she does not wan to go back to [facility]   Patient/Family's  Understanding of and Emotional Response to Diagnosis, Current Treatment, and Prognosis:  Patient reports that she'd like more information about her Diagnosis, Current Treatment, and Prognosis. Stated that she's awaiting Palliative Care to see her. Thanked CSW for her assistance.   Emotional Assessment Appearance:  Appears stated age Attitude/Demeanor/Rapport:   (None) Affect (typically observed):  Accepting, Calm, Pleasant Orientation:  Oriented to Self, Oriented to Place, Oriented to  Time, Oriented to Situation Alcohol / Substance use:  Not Applicable Psych involvement (Current and /or in the community):  No (Comment)  Discharge Needs  Concerns to be addressed:  Discharge Planning Concerns Readmission within the last 30 days:  No Current discharge risk:  Chronically ill Barriers to Discharge:  Continued Medical Work up   [employer], [place] 12/25/2015, 2:56 PM

## 2015-12-25 NOTE — Progress Notes (Signed)
Patient discharged via EMS. Shreyansh Tiffany S, RN  

## 2015-12-25 NOTE — Evaluation (Signed)
Physical Therapy Evaluation Patient Details Name: Jacqueline Boyd MRN: [id number] DOB: Jan 19, 1941 Today's Date: 12/25/2015   History of Present Illness  75 y/o female who had a R total hip replacement ~ 1 month ago and recently returned home from rehab.  She is admitted with hyponatremia and has multiple pressure ulcers/wounds and is having the most pain with the R LE wound. Pt with recent admission in July 2017. Negative results for PE. Pt with complaints of SOB symptoms. Hx includes CHF, COPD, HTN, and anxiety.  Clinical Impression  Pt is a pleasant 75 year old female who was admitted for hyponatremia. Pt actually agreeable to session. Pt complains of SOB symptoms during evaluation, however O2 sats WNL upon checking while on room air. Pt performs bed mobility with mod assist and requires mod assist for attempted transfers, however unsuccessful. Pt with poor motivation and is limited by pain.  Pt demonstrates deficits with strength/mobility/pain. Would benefit from skilled PT to address above deficits and promote optimal return to PLOF; recommend transition to STR upon discharge from acute hospitalization. Pt adamant about going home, however agreeable to SNF if last resort.       Follow Up Recommendations SNF (pt agreeable if last resort)    Equipment Recommendations       Recommendations for Other Services       Precautions / Restrictions Precautions Precautions: Fall Restrictions Weight Bearing Restrictions: No      Mobility  Bed Mobility Overal bed mobility: Needs Assistance;+2 for physical assistance Bed Mobility: Supine to Sit     Supine to sit: Mod assist     General bed mobility comments: assist for mobility including lifting B LE off bed. Pt also needs cues to assist for holding railing with B UE. Once seated at EOB, pt able to sit with supervision. Pt sat at EOB for extended time while MD present, however fatigues with increased time, requesting to return back to  bed.  Transfers Overall transfer level: Needs assistance Equipment used: Rolling walker (2 wheeled) Transfers: Sit to/from Stand Sit to Stand: Mod assist         General transfer comment: Pt attempted to stand, however poor effort noted.  Bed raised for ease, however pt unable to lift buttocks off bed. Pt refused to further stand for mulitple attempts. Unable to further attempt.   Ambulation/Gait             General Gait Details: not assessed at this time  Stairs            Wheelchair Mobility    Modified Rankin (Stroke Patients Only)       Balance Overall balance assessment: Needs assistance Sitting-balance support: Single extremity supported Sitting balance-Leahy Scale: Good                                       Pertinent Vitals/Pain Pain Assessment: Faces Faces Pain Scale: Hurts little more Pain Location: B LE Pain Descriptors / Indicators: Aching Pain Intervention(s): Limited activity within patient's tolerance;Premedicated before session;Repositioned    Home Living Family/patient expects to be discharged to:: Private residence Living Arrangements: Children (has son and daughter in Sports coach) Available Help at Discharge: Family Type of Home: House Home Access: Stairs to enter Entrance Stairs-Rails: Right Entrance Stairs-Number of Steps: 5 with 1 hand rail Home Layout: One level Home Equipment: Bedside commode;Crutches;Walker - 4 wheels;Walker - 2 wheels (lift chair) Additional Comments: family  is gone t/o the day, pt sleeps in recliner    Prior Function Level of Independence: Independent with assistive device(s)         Comments: Pt recently was household ambulator, sleeping in recliner     Hand Dominance        Extremity/Trunk Assessment   Upper Extremity Assessment: Generalized weakness (B UE grossly 4/5)           Lower Extremity Assessment: Generalized weakness (B LE grossly 2+/5; unable to fully raise off bed)          Communication   Communication: No difficulties  Cognition Arousal/Alertness: Awake/alert Behavior During Therapy: WFL for tasks assessed/performed Overall Cognitive Status: Within Functional Limits for tasks assessed                      General Comments      Exercises        Assessment/Plan    PT Assessment Patient needs continued PT services  PT Diagnosis Acute pain;Generalized weakness;Difficulty walking   PT Problem List Decreased strength;Decreased activity tolerance;Decreased mobility;Pain  PT Treatment Interventions DME instruction;Therapeutic exercise;Gait training   PT Goals (Current goals can be found in the Care Plan section) Acute Rehab PT Goals Patient Stated Goal: control the pain and be able to bring legs on/off bed indep PT Goal Formulation: With patient Time For Goal Achievement: 01/08/16 Potential to Achieve Goals: Good    Frequency Min 2X/week   Barriers to discharge Decreased caregiver support      Co-evaluation               End of Session Equipment Utilized During Treatment: Gait belt Activity Tolerance: Patient tolerated treatment well Patient left: in bed (with MD present) Nurse Communication: Mobility status    Functional Assessment Tool Used: clinical judgement Functional Limitation: Mobility: Walking and moving around Mobility: Walking and Moving Around Current Status [id number]): At least 40 percent but less than 60 percent impaired, limited or restricted Mobility: Walking and Moving Around Goal Status [id number]): At least 20 percent but less than 40 percent impaired, limited or restricted    Time: 1030-1101 PT Time Calculation (min) (ACUTE ONLY): 31 min   Charges:   PT Evaluation $PT Eval Moderate Complexity: 1 Procedure     PT G Codes:   PT G-Codes **NOT FOR INPATIENT CLASS** Functional Assessment Tool Used: clinical judgement Functional Limitation: Mobility: Walking and moving around Mobility: Walking and Moving  Around Current Status [id number]): At least 40 percent but less than 60 percent impaired, limited or restricted Mobility: Walking and Moving Around Goal Status [phone]): At least 20 percent but less than 40 percent impaired, limited or restricted    Maythe Deramo 12/25/2015, 11:31 AM  Greggory Stallion, PT, DPT [phone]

## 2015-12-25 NOTE — Progress Notes (Signed)
CSW received [facility] auth. Auth # [id number] . Clinical Social Worker was informed that patient will be medically ready to discharge to [facility]. Patient in a agreement with plan. CSW called Broadus John- Admissions Coordinator at [facility] to confirm that patient's bed is ready. Provided patient's room number [id number] and number to call for report Yaakov Guthrie [phone]. All discharge information faxed to [facility] via [facility]. Call to patient's son- Corene Cornea, left message to inform him patient would discharge to [facility]. RN will call report and patient will discharge to [facility] via EMS  Ernest Pine, MSW, LCSW, [place] Social Worker [phone]

## 2015-12-25 NOTE — Progress Notes (Signed)
CSW presented bed offers. Accepted bed offer at [facility]. CSW informed Broadus John- Admissions Coordinator. Contacted [employer] to obtain insurance auth. Awaiting auth. CSW will continue to follow and assist.  Ernest Pine, MSW, LCSW, [place] Social Worker [phone]

## 2015-12-25 NOTE — Discharge Summary (Addendum)
[facility] [place] NAME: Jacqueline Boyd    MR#:  [id number]  DATE OF BIRTH:  1941/05/26  DATE OF ADMISSION:  12/22/2015 ADMITTING PHYSICIAN: Henreitta Leber, MD  DATE OF DISCHARGE: 12/25/15  PRIMARY CARE PHYSICIAN: MCLAUGHLIN, Wilhemena Durie, MD    ADMISSION DIAGNOSIS:  Hyponatremia [E87.1] Weakness [R53.1] SOB (shortness of breath) [R06.02]  DISCHARGE DIAGNOSIS:  Active Problems:   Hyponatremia   SECONDARY DIAGNOSIS:   Past Medical History:  Diagnosis Date  . CHF (congestive heart failure) ([place])   . COPD with emphysema ([place])   . Hypertension     HOSPITAL COURSE:  Jacqueline Boyd  is a 75 y.o. female admitted 12/22/2015 with chief complaint Shortness of Breath . Please see H&P performed by Henreitta Leber, MD for further information. Patient presented with shortness of breath on exertion, generalized weakness. She was found to be hyponatremic, this was corrected with gentle IV fluid hydration. She continues to have difficulty with generalized weakness, but no real treatable etiology. PT evaluation recommends SNF placement   DISCHARGE CONDITIONS:   stable  CONSULTS OBTAINED:    DRUG ALLERGIES:   Allergies  Allergen Reactions  . Codeine Other (See Comments)    Itching and "feels like worms crawling in body".  . Penicillins Rash    Has patient had a PCN reaction causing immediate rash, facial/tongue/throat swelling, SOB or lightheadedness with hypotension: No  Has patient had a PCN reaction causing severe rash involving mucus membranes or skin necrosis: No Has patient had a PCN reaction that required hospitalization: No   Has patient had a PCN reaction occurring within the last 10 years: No  If all of the above answers are "NO", then may proceed with Cephalosporin use.   . Prednisone Palpitations    DISCHARGE MEDICATIONS:   Current Discharge Medication List    START taking these medications   Details  Amino  Acids-Protein Hydrolys (FEEDING SUPPLEMENT, PRO-STAT SUGAR FREE 64,) LIQD Take 30 mLs by mouth 2 (two) times daily. Qty: 900 mL, Refills: 0    mirtazapine (REMERON) 15 MG tablet Take 1 tablet (15 mg total) by mouth at bedtime. Qty: 30 tablet, Refills: 0      CONTINUE these medications which have CHANGED   Details  !! ALPRAZolam (XANAX) 0.25 MG tablet Take 1 tablet (0.25 mg total) by mouth 3 (three) times daily as needed for anxiety. Qty: 30 tablet, Refills: 0    oxyCODONE (OXY IR/ROXICODONE) 5 MG immediate release tablet Take 1-2 tablets (5-10 mg total) by mouth every 4 (four) hours as needed for moderate pain or breakthrough pain. Qty: 30 tablet, Refills: 0     !! - Potential duplicate medications found. Please discuss with provider.    CONTINUE these medications which have NOT CHANGED   Details  albuterol (PROVENTIL HFA;VENTOLIN HFA) 108 (90 Base) MCG/ACT inhaler Inhale 1-2 puffs into the lungs every 6 (six) hours as needed for wheezing or shortness of breath.    !! ALPRAZolam (XANAX) 0.25 MG tablet Take 1 tablet (0.25 mg total) by mouth 3 (three) times daily as needed for anxiety. Qty: 15 tablet, Refills: 0    aspirin EC 325 MG tablet Take 325 mg by mouth daily.    Multiple Vitamin (MULTIVITAMIN WITH MINERALS) TABS tablet Take 1 tablet by mouth daily.    acetaminophen (TYLENOL) 500 MG tablet Take 1,000 mg by mouth every 8 (eight) hours as needed.    B Complex-C (B-COMPLEX WITH VITAMIN C) tablet  Take 1 tablet by mouth daily.    docusate sodium (COLACE) 100 MG capsule Take 100 mg by mouth 2 (two) times daily as needed for mild constipation.    magnesium hydroxide (MILK OF MAGNESIA) 400 MG/5ML suspension Take 30 mLs by mouth every 4 (four) hours as needed for mild constipation.    ondansetron (ZOFRAN) 4 MG tablet Take 4 mg by mouth every 6 (six) hours as needed for nausea or vomiting.     !! - Potential duplicate medications found. Please discuss with provider.    STOP taking  these medications     furosemide (LASIX) 40 MG tablet          DISCHARGE INSTRUCTIONS:   Palliative care to follow at SNF DIET:  Regular diet  DISCHARGE CONDITION:  Stable  ACTIVITY:  Activity as tolerated  OXYGEN:  Home Oxygen: No.   Oxygen Delivery: room air  DISCHARGE LOCATION:  nursing home   If you experience worsening of your admission symptoms, develop shortness of breath, life threatening emergency, suicidal or homicidal thoughts you must seek medical attention immediately by calling 911 or calling your MD immediately  if symptoms less severe.  You Must read complete instructions/literature along with all the possible adverse reactions/side effects for all the Medicines you take and that have been prescribed to you. Take any new Medicines after you have completely understood and accpet all the possible adverse reactions/side effects.   Please note  You were cared for by a hospitalist during your hospital stay. If you have any questions about your discharge medications or the care you received while you were in the hospital after you are discharged, you can call the unit and asked to speak with the hospitalist on call if the hospitalist that took care of you is not available. Once you are discharged, your primary care physician will handle any further medical issues. Please note that NO REFILLS for any discharge medications will be authorized once you are discharged, as it is imperative that you return to your primary care physician (or establish a relationship with a primary care physician if you do not have one) for your aftercare needs so that they can reassess your need for medications and monitor your lab values.    On the day of Discharge:   VITAL SIGNS:  Blood pressure 122/64, pulse (!) 103, temperature 98.8 F (37.1 C), temperature source Oral, resp. rate 16, height 5\' 8"  (1.727 m), weight 98 lb (44.5 kg), SpO2 91 %.  I/O:   Intake/Output Summary (Last 24  hours) at 12/25/15 1146 Last data filed at 12/25/15 0900  Gross per 24 hour  Intake              240 ml  Output              400 ml  Net             -160 ml    PHYSICAL EXAMINATION:  GENERAL:  75 y.o.-year-old patient lying in the bed with no acute distress. Weak/frail EYES: Pupils equal, round, reactive to light and accommodation. No scleral icterus. Extraocular muscles intact.  HEENT: Head atraumatic, normocephalic. Oropharynx and nasopharynx clear.  NECK:  Supple, no jugular venous distention. No thyroid enlargement, no tenderness.  LUNGS: Normal breath sounds bilaterally, no wheezing, rales,rhonchi or crepitation. No use of accessory muscles of respiration.  CARDIOVASCULAR: S1, S2 normal. No murmurs, rubs, or gallops.  ABDOMEN: Soft, non-tender, non-distended. Bowel sounds present. No organomegaly or mass.  EXTREMITIES:  No pedal edema, cyanosis, or clubbing.  NEUROLOGIC: Cranial nerves II through XII are intact. Muscle strength 5/5 in all extremities. Sensation intact. Gait not checked.  PSYCHIATRIC: The patient is alert and oriented x 3.  SKIN: No obvious rash, lesion, or ulcer.   DATA REVIEW:   CBC  Recent Labs Lab 12/24/15 0457  WBC 6.7  HGB 9.0*  HCT 27.5*  PLT 456*    Chemistries   Recent Labs Lab 12/22/15 1154  12/25/15 0447  NA 126*  < > 135  K 4.3  < > 4.3  CL 92*  < > 102  CO2 22  < > 26  GLUCOSE 99  < > 101*  BUN 26*  < > 21*  CREATININE 0.77  < > 0.59  CALCIUM 9.4  < > 8.6*  AST 24  --   --   ALT 12*  --   --   ALKPHOS 111  --   --   BILITOT 0.3  --   --   < > = values in this interval not displayed.  Cardiac Enzymes  Recent Labs Lab 12/22/15 1154  TROPONINI <0.03    Microbiology Results  Results for orders placed or performed during the hospital encounter of 11/13/15  Urine culture     Status: Abnormal   Collection Time: 11/20/15  4:55 PM  Result Value Ref Range Status   Specimen Description URINE, RANDOM  Final   Special Requests  NONE  Final   Culture (A)  Final    2,000 COLONIES/mL INSIGNIFICANT GROWTH Performed at [facility]    Report Status 11/22/2015 FINAL  Final    RADIOLOGY:  No results found.   Management plans discussed with the patient, family and they are in agreement.  CODE STATUS:     Code Status Orders        Start     Ordered   12/22/15 2102  Full code  Continuous     12/22/15 2101    Code Status History    Date Active Date Inactive Code Status Order ID Comments User Context   12/09/2015  5:59 PM 12/12/2015  9:17 PM Full Code [id number]  Lytle Butte, MD ED   11/12/2015  3:18 AM 11/13/2015 10:54 PM Full Code [id number]  Harrie Foreman, MD Inpatient      TOTAL TIME TAKING CARE OF THIS PATIENT: 32 minutes.    Hower,  Karenann Cai.D on 12/25/2015 at 11:46 AM  Between 7am to 6pm - Pager - [phone]  After 6pm go to [email or web address] - Proofreader  [facility]  Office  [phone]  CC: Primary care physician; Marinda Elk, MD

## 2015-12-25 NOTE — NC FL2 (Signed)
[place] LEVEL OF CARE SCREENING TOOL     IDENTIFICATION  Patient Name: Jacqueline Boyd Birthdate: August 18, 1940 Sex: female Admission Date (Current Location): 12/22/2015  [place] and [place] Number:  Engineering geologist and Address:  [facility], [address] [place], [place] [address]      Provider Number: [id number]  Attending Physician Name and Address:  Lytle Butte, MD  Relative Name and Phone Number:       Current Level of Care: Hospital Recommended Level of Care: [place] Prior Approval Number:    Date Approved/Denied:   PASRR Number:  ([id number])  Discharge Plan: SNF    Current Diagnoses: Patient Active Problem List   Diagnosis Date Noted  . Protein-calorie malnutrition, severe 12/11/2015  . Pressure ulcer 12/10/2015  . Hyponatremia 12/09/2015  . Chronic diastolic heart failure ([place]) 11/08/2015  . Pulmonary hypertension ([place]) 11/08/2015  . Degenerative arthritis of hip 11/06/2015  . 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 Degeneration of intervertebral disc of lumbar region 05/09/2015  . Neuritis or radiculitis due to rupture of lumbar intervertebral disc 05/09/2015  . LBP (low back pain) 12/27/2014  . Coxitis 02/03/2014  . Chronic obstructive pulmonary disease ([place]) 05/04/2012  . Essential (primary) hypertension 05/04/2012    Orientation RESPIRATION BLADDER Height & Weight     Self  Normal Continent Weight: 98 lb (44.5 kg) Height:  5\' 8"  (172.7 cm)  BEHAVIORAL SYMPTOMS/MOOD NEUROLOGICAL BOWEL NUTRITION STATUS   (None)  (None) Continent Diet (Regular)  AMBULATORY STATUS COMMUNICATION OF NEEDS Skin   Extensive Assist Verbally Other (Comment) (Pressure Ulcer- Medial Sacrum; Medial Right Leg; Right Leg- Lateral Right healing wound; Left Upper Leg- Open Healing wound  )                       Personal Care Assistance Level of Assistance  Bathing, Feeding, Dressing Bathing Assistance: Limited  assistance Feeding assistance: Independent Dressing Assistance: Limited assistance     Functional Limitations Info  Sight, Hearing, Speech Sight Info: Adequate Hearing Info: Adequate Speech Info: Adequate    SPECIAL CARE FACTORS FREQUENCY  PT (By licensed PT)     PT Frequency:  (5)              Contractures      Additional Factors Info  Code Status, Allergies Code Status Info:  (Full Code) Allergies Info:  (Codeine, Penicillins, Prednisone)           Current Medications (12/25/2015):  This is the current hospital active medication list Current Facility-Administered Medications  Medication Dose Route Frequency Provider Last Rate Last Dose  . acetaminophen (TYLENOL) tablet 650 mg  650 mg Oral Q6H PRN Henreitta Leber, MD       Or  . acetaminophen (TYLENOL) suppository 650 mg  650 mg Rectal Q6H PRN Henreitta Leber, MD      . albuterol (PROVENTIL) (2.5 MG/3ML) 0.083% nebulizer solution 3 mL  3 mL Inhalation Q6H PRN Henreitta Leber, MD      . ALPRAZolam Duanne Moron) tablet 0.25 mg  0.25 mg Oral TID PRN Henreitta Leber, MD   0.25 mg at 12/25/15 1359  . aspirin EC tablet 325 mg  325 mg Oral Daily Henreitta Leber, MD   325 mg at 12/25/15 1030  . cyclobenzaprine (FLEXERIL) tablet 5 mg  5 mg Oral TID PRN Lytle Butte, MD   5 mg at 12/25/15 1434  . diphenhydrAMINE (BENADRYL) capsule 25 mg  25 mg Oral Q6H PRN Lytle Butte, MD   25 mg at 12/23/15 2035  . enoxaparin (LOVENOX) injection 40 mg  40 mg Subcutaneous Q24H Henreitta Leber, MD   40 mg at 12/24/15 2210  . feeding supplement (PRO-STAT SUGAR FREE 64) liquid 30 mL  30 mL Oral BID Lytle Butte, MD   30 mL at 12/24/15 2210  . ipratropium-albuterol (DUONEB) 0.5-2.5 (3) MG/3ML nebulizer solution 3 mL  3 mL Nebulization Q4H PRN Henreitta Leber, MD   3 mL at 12/24/15 1833  . mirtazapine (REMERON) tablet 15 mg  15 mg Oral QHS Lytle Butte, MD   15 mg at 12/24/15 2210  . morphine 2 MG/ML injection 1 mg  1 mg Intravenous Q4H PRN Lytle Butte, MD   1 mg at 12/25/15 1030  . morphine CONCENTRATE 10 MG/0.5ML oral solution 10 mg  10 mg Oral Q2H PRN Henreitta Leber, MD   10 mg at 12/24/15 1832  . multivitamin with minerals tablet 1 tablet  1 tablet Oral Daily Henreitta Leber, MD   1 tablet at 12/25/15 1030  . ondansetron (ZOFRAN) tablet 4 mg  4 mg Oral Q6H PRN Henreitta Leber, MD       Or  . ondansetron (ZOFRAN) injection 4 mg  4 mg Intravenous Q6H PRN Henreitta Leber, MD      . oxyCODONE (Oxy IR/ROXICODONE) immediate release tablet 5-10 mg  5-10 mg Oral Q4H PRN Lytle Butte, MD         Discharge Medications: Please see discharge summary for a list of discharge medications.  Relevant Imaging Results:  Relevant Lab Results:   Additional Information  (SSN [id number])  Lorenso Quarry Analicia Skibinski, LCSW

## 2015-12-25 NOTE — Progress Notes (Signed)
Report called to Kim at [facility]. Awaiting EMS transport. Madlyn Frankel, RN

## 2015-12-25 NOTE — Progress Notes (Signed)
New referral for Palliative Care consult at [facility] following discharge received from [place]. Information faxed to [place] referral. Thank you. Flo Shanks RN, BSN, [place] and [facility] Liaison

## 2015-12-25 NOTE — Care Management (Signed)
Admitted to [facility] with the diagnosis of hyponatremia under observation status. Lives with son, Corene Cornea [phone]). Followed by [facility] in the home. [facility] x 20 days when discharged from this facility 11/13/15. No home oxygen. Cane, rolling walker, and bedside commode in the home. Takes care of all basic activities of daily living herself, can drive, but doesn't. Family members take care of instrumental activities of daily living. No falls. Decreased appetite since hip surgery in June. Prescriptions are filled at [facility]. Family will transport. Shelbie Ammons RN MSN CCM Care Management [phone]

## 2015-12-25 NOTE — Progress Notes (Signed)
EMS called for transport. Sye Schroepfer S, RN  

## 2016-01-01 ENCOUNTER — Encounter: Payer: Self-pay | Admitting: Emergency Medicine

## 2016-01-01 ENCOUNTER — Observation Stay
Admission: EM | Admit: 2016-01-01 | Discharge: 2016-01-04 | Disposition: A | Discharge status: Home / Self Care | Payer: Commercial Managed Care - HMO | Attending: Internal Medicine | Admitting: Internal Medicine

## 2016-01-01 DIAGNOSIS — M5116 Intervertebral disc disorders with radiculopathy, lumbar region: Secondary | ICD-10-CM | POA: Insufficient documentation

## 2016-01-01 DIAGNOSIS — I517 Cardiomegaly: Secondary | ICD-10-CM | POA: Insufficient documentation

## 2016-01-01 DIAGNOSIS — M1A9XX1 Chronic gout, unspecified, with tophus (tophi): Secondary | ICD-10-CM | POA: Diagnosis not present

## 2016-01-01 DIAGNOSIS — D649 Anemia, unspecified: Secondary | ICD-10-CM | POA: Insufficient documentation

## 2016-01-01 DIAGNOSIS — G8929 Other chronic pain: Secondary | ICD-10-CM

## 2016-01-01 DIAGNOSIS — D509 Iron deficiency anemia, unspecified: Secondary | ICD-10-CM | POA: Diagnosis not present

## 2016-01-01 DIAGNOSIS — D7589 Other specified diseases of blood and blood-forming organs: Secondary | ICD-10-CM | POA: Diagnosis not present

## 2016-01-01 DIAGNOSIS — Z79899 Other long term (current) drug therapy: Secondary | ICD-10-CM | POA: Insufficient documentation

## 2016-01-01 DIAGNOSIS — I2510 Atherosclerotic heart disease of native coronary artery without angina pectoris: Secondary | ICD-10-CM | POA: Diagnosis not present

## 2016-01-01 DIAGNOSIS — R531 Weakness: Secondary | ICD-10-CM | POA: Insufficient documentation

## 2016-01-01 DIAGNOSIS — E875 Hyperkalemia: Secondary | ICD-10-CM

## 2016-01-01 DIAGNOSIS — Z880 Allergy status to penicillin: Secondary | ICD-10-CM | POA: Insufficient documentation

## 2016-01-01 DIAGNOSIS — T84090A Other mechanical complication of internal right hip prosthesis, initial encounter: Secondary | ICD-10-CM | POA: Diagnosis not present

## 2016-01-01 DIAGNOSIS — J449 Chronic obstructive pulmonary disease, unspecified: Secondary | ICD-10-CM | POA: Diagnosis not present

## 2016-01-01 DIAGNOSIS — M79641 Pain in right hand: Secondary | ICD-10-CM

## 2016-01-01 DIAGNOSIS — M79642 Pain in left hand: Secondary | ICD-10-CM | POA: Diagnosis not present

## 2016-01-01 DIAGNOSIS — D473 Essential (hemorrhagic) thrombocythemia: Secondary | ICD-10-CM | POA: Diagnosis not present

## 2016-01-01 DIAGNOSIS — I447 Left bundle-branch block, unspecified: Secondary | ICD-10-CM | POA: Insufficient documentation

## 2016-01-01 DIAGNOSIS — X58XXXD Exposure to other specified factors, subsequent encounter: Secondary | ICD-10-CM | POA: Diagnosis not present

## 2016-01-01 DIAGNOSIS — L089 Local infection of the skin and subcutaneous tissue, unspecified: Secondary | ICD-10-CM | POA: Insufficient documentation

## 2016-01-01 DIAGNOSIS — Z87891 Personal history of nicotine dependence: Secondary | ICD-10-CM | POA: Insufficient documentation

## 2016-01-01 DIAGNOSIS — M109 Gout, unspecified: Secondary | ICD-10-CM | POA: Diagnosis not present

## 2016-01-01 DIAGNOSIS — L97909 Non-pressure chronic ulcer of unspecified part of unspecified lower leg with unspecified severity: Secondary | ICD-10-CM | POA: Insufficient documentation

## 2016-01-01 DIAGNOSIS — L02511 Cutaneous abscess of right hand: Secondary | ICD-10-CM

## 2016-01-01 DIAGNOSIS — K5900 Constipation, unspecified: Secondary | ICD-10-CM | POA: Insufficient documentation

## 2016-01-01 DIAGNOSIS — E43 Unspecified severe protein-calorie malnutrition: Secondary | ICD-10-CM | POA: Insufficient documentation

## 2016-01-01 DIAGNOSIS — I5032 Chronic diastolic (congestive) heart failure: Secondary | ICD-10-CM

## 2016-01-01 DIAGNOSIS — I272 Other secondary pulmonary hypertension: Secondary | ICD-10-CM | POA: Diagnosis not present

## 2016-01-01 DIAGNOSIS — Z681 Body mass index (BMI) 19 or less, adult: Secondary | ICD-10-CM | POA: Insufficient documentation

## 2016-01-01 DIAGNOSIS — M247 Protrusio acetabuli: Secondary | ICD-10-CM | POA: Insufficient documentation

## 2016-01-01 DIAGNOSIS — M65139 Other infective (teno)synovitis, unspecified wrist: Secondary | ICD-10-CM | POA: Diagnosis present

## 2016-01-01 DIAGNOSIS — I700 Atherosclerosis of aorta: Secondary | ICD-10-CM | POA: Insufficient documentation

## 2016-01-01 DIAGNOSIS — Z7982 Long term (current) use of aspirin: Secondary | ICD-10-CM | POA: Insufficient documentation

## 2016-01-01 DIAGNOSIS — E871 Hypo-osmolality and hyponatremia: Secondary | ICD-10-CM | POA: Insufficient documentation

## 2016-01-01 DIAGNOSIS — Z888 Allergy status to other drugs, medicaments and biological substances status: Secondary | ICD-10-CM | POA: Insufficient documentation

## 2016-01-01 DIAGNOSIS — I110 Hypertensive heart disease with heart failure: Secondary | ICD-10-CM | POA: Insufficient documentation

## 2016-01-01 DIAGNOSIS — S80212A Abrasion, left knee, initial encounter: Secondary | ICD-10-CM | POA: Insufficient documentation

## 2016-01-01 DIAGNOSIS — Z885 Allergy status to narcotic agent status: Secondary | ICD-10-CM | POA: Insufficient documentation

## 2016-01-01 LAB — BASIC METABOLIC PANEL
ANION GAP: 10 (ref 5–15)
BUN: 47 mg/dL — ABNORMAL HIGH (ref 6–20)
CALCIUM: 8.8 mg/dL — AB (ref 8.9–10.3)
CHLORIDE: 95 mmol/L — AB (ref 101–111)
CO2: 29 mmol/L (ref 22–32)
Creatinine, Ser: 0.74 mg/dL (ref 0.44–1.00)
GFR calc Af Amer: >60 mL/min (ref >60)
GFR calc non Af Amer: >60 mL/min (ref >60)
GLUCOSE: 100 mg/dL — AB (ref 65–99)
POTASSIUM: 5.7 mmol/L — AB (ref 3.5–5.1)
Sodium: 134 mmol/L — ABNORMAL LOW (ref 135–145)

## 2016-01-01 LAB — CBC
HCT: 25.4 % — ABNORMAL LOW (ref 35.0–47.0)
Hemoglobin: 8.4 g/dL — ABNORMAL LOW (ref 12.0–16.0)
MCH: 27.4 pg (ref 26.0–34.0)
MCHC: 33 g/dL (ref 32.0–36.0)
MCV: 82.8 fL (ref 80.0–100.0)
Platelets: 541 10*3/uL — ABNORMAL HIGH (ref 150–440)
RBC: 3.07 MIL/uL — AB (ref 3.80–5.20)
RDW: 16.3 % — AB (ref 11.5–14.5)
WBC: 5.6 10*3/uL (ref 3.6–11.0)

## 2016-01-01 LAB — POTASSIUM: Potassium: 5.8 mmol/L — ABNORMAL HIGH (ref 3.5–5.1)

## 2016-01-01 MED ORDER — CIPROFLOXACIN IN D5W 400 MG/200ML IV SOLN
400.0000 mg | Freq: Two times a day (BID) | INTRAVENOUS | Status: DC
  Administered 2016-01-01 – 2016-01-02 (×2): 400 mg via INTRAVENOUS
  Filled 2016-01-01 (×3): qty 200

## 2016-01-01 MED ORDER — ONDANSETRON HCL 4 MG PO TABS: 4.0000 mg | ORAL_TABLET | Freq: Four times a day (QID) | ORAL | Status: DC | PRN

## 2016-01-01 MED ORDER — VANCOMYCIN HCL IN DEXTROSE 1-5 GM/200ML-% IV SOLN
1000.0000 mg | Freq: Once | INTRAVENOUS | Status: AC
  Administered 2016-01-01: 1000 mg via INTRAVENOUS
  Filled 2016-01-01: qty 200

## 2016-01-01 MED ORDER — LIDOCAINE HCL (PF) 1 % IJ SOLN
INTRAMUSCULAR | Status: AC
  Administered 2016-01-01: 5 mL via INTRADERMAL
  Filled 2016-01-01: qty 5

## 2016-01-01 MED ORDER — PRO-STAT SUGAR FREE PO LIQD
30.0000 mL | Freq: Two times a day (BID) | ORAL | Status: DC
  Administered 2016-01-02 – 2016-01-04 (×3): 30 mL via ORAL

## 2016-01-01 MED ORDER — DOCUSATE SODIUM 100 MG PO CAPS
100.0000 mg | ORAL_CAPSULE | Freq: Two times a day (BID) | ORAL | Status: DC
  Administered 2016-01-01 – 2016-01-04 (×6): 100 mg via ORAL
  Filled 2016-01-01 (×3): qty 1

## 2016-01-01 MED ORDER — ACETAMINOPHEN 325 MG PO TABS: 650.0000 mg | ORAL_TABLET | Freq: Four times a day (QID) | ORAL | Status: DC | PRN

## 2016-01-01 MED ORDER — VANCOMYCIN HCL IN DEXTROSE 750-5 MG/150ML-% IV SOLN
750.0000 mg | INTRAVENOUS | Status: DC
  Filled 2016-01-01: qty 150

## 2016-01-01 MED ORDER — ADULT MULTIVITAMIN W/MINERALS CH
1.0000 | ORAL_TABLET | Freq: Every day | ORAL | Status: DC
  Administered 2016-01-02 – 2016-01-04 (×3): 1 via ORAL
  Filled 2016-01-01 (×3): qty 1

## 2016-01-01 MED ORDER — MIRTAZAPINE 15 MG PO TABS
15.0000 mg | ORAL_TABLET | Freq: Every day | ORAL | Status: DC
  Administered 2016-01-01 – 2016-01-03 (×3): 15 mg via ORAL
  Filled 2016-01-01 (×3): qty 1

## 2016-01-01 MED ORDER — TRAZODONE HCL 50 MG PO TABS: 25.0000 mg | ORAL_TABLET | Freq: Every evening | ORAL | Status: DC | PRN

## 2016-01-01 MED ORDER — ONDANSETRON HCL 4 MG/2ML IJ SOLN
4.0000 mg | Freq: Four times a day (QID) | INTRAMUSCULAR | Status: DC | PRN
  Administered 2016-01-03 – 2016-01-04 (×2): 4 mg via INTRAVENOUS
  Filled 2016-01-01 (×2): qty 2

## 2016-01-01 MED ORDER — PENTAFLUOROPROP-TETRAFLUOROETH EX AERO
INHALATION_SPRAY | CUTANEOUS | Status: AC
  Filled 2016-01-01: qty 30

## 2016-01-01 MED ORDER — OXYCODONE-ACETAMINOPHEN 5-325 MG PO TABS
1.0000 | ORAL_TABLET | ORAL | Status: AC
  Administered 2016-01-01: 1 via ORAL
  Filled 2016-01-01: qty 1

## 2016-01-01 MED ORDER — BISACODYL 5 MG PO TBEC
5.0000 mg | DELAYED_RELEASE_TABLET | Freq: Every day | ORAL | Status: DC | PRN
  Administered 2016-01-02: 5 mg via ORAL
  Filled 2016-01-01: qty 1

## 2016-01-01 MED ORDER — HEPARIN SODIUM (PORCINE) 5000 UNIT/ML IJ SOLN
5000.0000 [IU] | Freq: Three times a day (TID) | INTRAMUSCULAR | Status: DC
  Administered 2016-01-01 – 2016-01-02 (×2): 5000 [IU] via SUBCUTANEOUS
  Filled 2016-01-01 (×2): qty 1

## 2016-01-01 MED ORDER — LIDOCAINE HCL (PF) 1 % IJ SOLN
5.0000 mL | Freq: Once | INTRAMUSCULAR | Status: AC
  Administered 2016-01-01: 5 mL via INTRADERMAL

## 2016-01-01 MED ORDER — ALPRAZOLAM 0.5 MG PO TABS
ORAL_TABLET | ORAL | Status: AC
  Administered 2016-01-01: 0.25 mg via ORAL
  Filled 2016-01-01: qty 1

## 2016-01-01 MED ORDER — ACETAMINOPHEN 650 MG RE SUPP: 650.0000 mg | Freq: Four times a day (QID) | RECTAL | Status: DC | PRN

## 2016-01-01 MED ORDER — OXYCODONE HCL 5 MG PO TABS
5.0000 mg | ORAL_TABLET | ORAL | Status: DC | PRN
  Administered 2016-01-01: 5 mg via ORAL
  Administered 2016-01-02: 10 mg via ORAL
  Administered 2016-01-02 (×3): 5 mg via ORAL
  Administered 2016-01-03 – 2016-01-04 (×5): 10 mg via ORAL
  Filled 2016-01-01 (×2): qty 2
  Filled 2016-01-01: qty 1
  Filled 2016-01-01: qty 2
  Filled 2016-01-01 (×3): qty 1
  Filled 2016-01-01: qty 2
  Filled 2016-01-01: qty 1
  Filled 2016-01-01 (×2): qty 2

## 2016-01-01 MED ORDER — ALBUTEROL SULFATE (2.5 MG/3ML) 0.083% IN NEBU: 2.5000 mg | INHALATION_SOLUTION | Freq: Four times a day (QID) | RESPIRATORY_TRACT | Status: DC | PRN

## 2016-01-01 MED ORDER — ALPRAZOLAM 0.25 MG PO TABS
0.2500 mg | ORAL_TABLET | Freq: Three times a day (TID) | ORAL | Status: DC | PRN
  Administered 2016-01-01 – 2016-01-03 (×6): 0.25 mg via ORAL
  Filled 2016-01-01 (×6): qty 1

## 2016-01-01 MED ORDER — PENTAFLUOROPROP-TETRAFLUOROETH EX AERO
INHALATION_SPRAY | CUTANEOUS | Status: DC | PRN
  Administered 2016-01-01: 18:00:00 via TOPICAL

## 2016-01-01 MED ORDER — MAGNESIUM HYDROXIDE 400 MG/5ML PO SUSP: 30.0000 mL | ORAL | Status: DC | PRN

## 2016-01-01 MED ORDER — SODIUM POLYSTYRENE SULFONATE 15 GM/60ML PO SUSP
15.0000 g | Freq: Once | ORAL | Status: AC
  Administered 2016-01-01: 15 g via ORAL
  Filled 2016-01-01: qty 60

## 2016-01-01 MED ORDER — DOCUSATE SODIUM 100 MG PO CAPS
100.0000 mg | ORAL_CAPSULE | Freq: Two times a day (BID) | ORAL | Status: DC | PRN
  Filled 2016-01-01 (×3): qty 1

## 2016-01-01 MED ORDER — B COMPLEX-C PO TABS
1.0000 | ORAL_TABLET | Freq: Every day | ORAL | Status: DC
  Administered 2016-01-04: 1 via ORAL
  Filled 2016-01-01 (×4): qty 1

## 2016-01-01 MED ORDER — CLINDAMYCIN PHOSPHATE 600 MG/50ML IV SOLN
600.0000 mg | Freq: Once | INTRAVENOUS | Status: AC
  Administered 2016-01-01: 600 mg via INTRAVENOUS
  Filled 2016-01-01: qty 50

## 2016-01-01 MED ORDER — VANCOMYCIN HCL IN DEXTROSE 750-5 MG/150ML-% IV SOLN
750.0000 mg | INTRAVENOUS | Status: DC
  Filled 2016-01-01 (×2): qty 150

## 2016-01-01 MED ORDER — CYCLOBENZAPRINE HCL 10 MG PO TABS
5.0000 mg | ORAL_TABLET | Freq: Three times a day (TID) | ORAL | Status: DC | PRN
  Administered 2016-01-01 – 2016-01-04 (×4): 5 mg via ORAL
  Filled 2016-01-01 (×4): qty 1

## 2016-01-01 MED ORDER — ALPRAZOLAM 0.5 MG PO TABS
0.2500 mg | ORAL_TABLET | Freq: Once | ORAL | Status: AC
  Administered 2016-01-01: 0.25 mg via ORAL

## 2016-01-01 NOTE — H&P (Signed)
[place] at [place] NAME: Jacqueline Boyd    MR#:  [id number]  DATE OF BIRTH:  May 14, 1941  DATE OF ADMISSION:  01/01/2016  PRIMARY CARE PHYSICIAN: Marinda Elk, MD   REQUESTING/REFERRING PHYSICIAN: Dr. Jacqualine Code  CHIEF COMPLAINT: Infection on right thumb    Chief Complaint  Patient presents with  . Other    HISTORY OF PRESENT ILLNESS:  [name]  is a 75 y.o. female with a known history of Alcoholic diastolic heart failure, protein calorie malnutrition, COPD brought in because of painful right thumb for the past 3 days. Primary doctor started her on Keflex but the symptoms persisted with swelling, pain. Patient also noticed periods of infection or productive under the skin of left hand index finger, middle finger, right hand index finger, dorsum of the left hand. Denies any history of gallops. Patient has history of osteoarthritis. No history of recent travel or trauma. Have right leg infection not healing. Also noticed to have stage I sacral ulcer..  denies any fever.  PAST MEDICAL HISTORY:   Past Medical History:  Diagnosis Date  . CHF (congestive heart failure) ([place])   . COPD with emphysema ([place])   . Hypertension     PAST SURGICAL HISTOIRY:   Past Surgical History:  Procedure Laterality Date  . HIP SURGERY Right   . 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 Smoking status: Former Smoker    Quit date: 11/11/1990  . Smokeless tobacco: Never Used  . Alcohol use Yes    FAMILY HISTORY:   Family History  Problem Relation Age of Onset  . Hypertension Other     DRUG ALLERGIES:   Allergies  Allergen Reactions  . Codeine Other (See Comments)    Itching and "feels like worms crawling in body".  . Penicillins Rash    Has patient had a PCN reaction causing immediate rash, facial/tongue/throat swelling, SOB or lightheadedness with hypotension: No  Has  patient had a PCN reaction causing severe rash involving mucus membranes or skin necrosis: No Has patient had a PCN reaction that required hospitalization: No   Has patient had a PCN reaction occurring within the last 10 years: No  If all of the above answers are "NO", then may proceed with Cephalosporin use.   . Prednisone Palpitations    REVIEW OF SYSTEMS:  CONSTITUTIONALPatient complains of ambulatory difficulties since the last discharge  EYES: No blurred or double vision.  EARS, NOSE, AND THROAT: No tinnitus or ear pain.  RESPIRATORY: No cough, shortness of breath, wheezing or hemoptysis.  CARDIOVASCULAR: No chest pain, orthopnea, edema.  GASTROINTESTINAL: No nausea, vomiting, diarrhea or abdominal pain.  GENITOURINARY: No dysuria, hematuria.  ENDOCRINE: No polyuria, nocturia,  HEMATOLOGY: No anemia, easy bruising or bleeding SKIN: No rash or lesion. MUSCULOSKELETAL: Multiple areas of infection in the right thumb, right index finger, left index finger,  dorsum of the left wrist.  NEUROLOGIC: No tingling, numbness, weakness.  PSYCHIATRY: No anxiety or depression.   MEDICATIONS AT HOME:   Prior to Admission medications   Medication Sig Start Date End Date Taking? Authorizing Provider  acetaminophen (TYLENOL) 500 MG tablet Take 1,000 mg by mouth every 8 (eight) hours as needed.   Yes Historical Provider, MD  albuterol (PROVENTIL HFA;VENTOLIN HFA) 108 (90 Base) MCG/ACT inhaler Inhale 1-2 puffs into the lungs every 6 (six) hours as needed for  wheezing or shortness of breath.   Yes Historical Provider, MD  ALPRAZolam (XANAX) 0.25 MG tablet Take 1 tablet (0.25 mg total) by mouth 3 (three) times daily as needed for anxiety. 12/25/15  Yes Lytle Butte, MD  aspirin EC 325 MG tablet Take 325 mg by mouth daily.   Yes Historical Provider, MD  cyclobenzaprine (FLEXERIL) 5 MG tablet Take 1 tablet (5 mg total) by mouth 3 (three) times daily as needed for muscle spasms. 12/25/15  Yes Lytle Butte,  MD  mirtazapine (REMERON) 15 MG tablet Take 1 tablet (15 mg total) by mouth at bedtime. 12/25/15  Yes Lytle Butte, MD  Multiple Vitamin (MULTIVITAMIN WITH MINERALS) TABS tablet Take 1 tablet by mouth daily.   Yes Historical Provider, MD  oxyCODONE (OXY IR/ROXICODONE) 5 MG immediate release tablet Take 1-2 tablets (5-10 mg total) by mouth every 4 (four) hours as needed for moderate pain or breakthrough pain. 12/25/15  Yes Lytle Butte, MD  Amino Acids-Protein Hydrolys (FEEDING SUPPLEMENT, PRO-STAT SUGAR FREE 64,) LIQD Take 30 mLs by mouth 2 (two) times daily. 12/25/15   Lytle Butte, MD  B Complex-C (B-COMPLEX WITH VITAMIN C) tablet Take 1 tablet by mouth daily.    Historical Provider, MD  docusate sodium (COLACE) 100 MG capsule Take 100 mg by mouth 2 (two) times daily as needed for mild constipation.    Historical Provider, MD  magnesium hydroxide (MILK OF MAGNESIA) 400 MG/5ML suspension Take 30 mLs by mouth every 4 (four) hours as needed for mild constipation.    Historical Provider, MD  ondansetron (ZOFRAN) 4 MG tablet Take 4 mg by mouth every 6 (six) hours as needed for nausea or vomiting.    Historical Provider, MD      VITAL SIGNS:  Blood pressure 133/66, pulse 93, temperature 97.5 F (36.4 C), temperature source Oral, resp. rate (!) 21, height 5\' 8"  (1.727 m), weight 59 kg (130 lb), SpO2 99 %.  PHYSICAL EXAMINATION:  GENERAL:  76 y.o.-year-old patient lying in the bed with no acute distress. Appears  cachectic.  EYES: Pupils equal, round, reactive to light and accommodation. No scleral icterus. Extraocular muscles intact.  HEENT: Head atraumatic, normocephalic. Oropharynx and nasopharynx clear.  NECK:  Supple, no jugular venous distention. No thyroid enlargement, no tenderness.  LUNGS: Normal breath sounds bilaterally, no wheezing, rales,rhonchi or crepitation. No use of accessory muscles of respiration.  CARDIOVASCULAR: S1, S2 normal. No murmurs, rubs, or gallops.  ABDOMEN: Soft,  nontender, nondistended. Bowel sounds present. No organomegaly or mass.  EXTREMITIES:Patient has infection of the right leg which  Is  healing slowly, pressure ulcer in the buttock which is also healing slowly. Noted  have pus coming out of the right thumb patient had incision and drainage. Also noted to have tender spots on the right index finger, dorsum of the left wrist,  Left index finger,the areas look like small boils and ready to pop   NEUROLOGIC: Cranial nerves II through XII are intact. Muscle strength 5/5 in all extremities. Sensation intact. Gait not checked.  PSYCHIATRIC: The patient is alert and oriented x 3.  SKIN: No obvious rash, lesion, or ulcer.   LABORATORY PANEL:   CBC  Recent Labs Lab 01/01/16 1548  WBC 5.6  HGB 8.4*  HCT 25.4*  PLT 541*   ------------------------------------------------------------------------------------------------------------------  Chemistries   Recent Labs Lab 01/01/16 1548  NA 134*  K 5.7*  CL 95*  CO2 29  GLUCOSE 100*  BUN 47*  CREATININE 0.74  CALCIUM 8.8*   ------------------------------------------------------------------------------------------------------------------  Cardiac Enzymes No results for input(s): TROPONINI in the last 168 hours. ------------------------------------------------------------------------------------------------------------------  RADIOLOGY:  No results found.  EKG:   Orders placed or performed during the hospital encounter of 01/01/16  . ED EKG  . ED EKG  . EKG 12-Lead  . EKG 12-Lead  Normal sinus rhythm 92 bpm no ST-T changes.  IMPRESSION AND PLAN:   1/multiple skin nodules on both hands concerning for possible contiguous infected tenosynovitis/bacteremia?patient has no fever or WBC. At this time patient will be started on vancomycin, Cipro. Right thumb infection is drained by emergency room physician.  Ortho is consulted for evaluation of tenosynovitis..ID consult as well. Check echo  for evaluation of endocarditis. #2 nonhealing ulcers on the right leg also sacrum. Evaluate for peripheral ulcer disease. Vascular surgery consult. Wound care consult. #3 hyperkalemia likely dietary induced. Started on Kayexalate. #4 severe malnutrition' started on nutritional supplements  5. diastolic heart failure' chronic: 6 COPD chronic.  All the records are reviewed and case discussed with ED provider. Management plans discussed with the patient, family and they are in agreement.  CODE STATUS: Full code  TOTAL TIME TAKING CARE OF THIS PATIENT: 55 minutes.    Epifanio Lesches M.D on 01/01/2016 at 6:59 PM  Between 7am to 6pm - Pager - [phone]  After 6pm go to [email or web address] - password [email or web address] [facility]  Office  [phone]  CC: Primary care physician; Marinda Elk, MD  Note: This dictation was prepared with Dragon dictation along with smaller phrase technology. Any transcriptional errors that result from this process are unintentional.

## 2016-01-01 NOTE — ED Provider Notes (Signed)
[facility] Emergency Department Provider Note   ____________________________________________   None    (approximate)  I have reviewed the triage vital signs and the nursing notes.   HISTORY  Chief Complaint Other    HPI Jacqueline Boyd is a 75 y.o. female presents for evaluation of infection on her finger on the right hand.  reports her right thumb has been painful and swollen for 3 days. Her doctor put her on Keflex, and the tip of the right thumb has continued to swell and feels throbbing. No numbness. No drainage. She reports she sees pus underneath the skin.  In addition the patient has seen her primary care is over the last several days she's had a couple small similar areas popped up under the skin of the left hand middle finger and right index, however these are small and improving while the one on the thumb seems to be increasing in swelling and tenderness.  No fevers or chills. No headaches nausea or vomiting. Past Medical History:  Diagnosis Date  . CHF (congestive heart failure) ([place])   . COPD with emphysema ([place])   . Hypertension     Patient Active Problem List   Diagnosis Date Noted  . Protein-calorie malnutrition, severe 12/11/2015  . Pressure ulcer 12/10/2015  . Hyponatremia 12/09/2015  . Chronic diastolic heart failure ([place]) 11/08/2015  . Pulmonary hypertension ([place]) 11/08/2015  . Degenerative arthritis of hip 11/06/2015  . 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 Degeneration of intervertebral disc of lumbar region 05/09/2015  . Neuritis or radiculitis due to rupture of lumbar intervertebral disc 05/09/2015  . LBP (low back pain) 12/27/2014  . Coxitis 02/03/2014  . Chronic obstructive pulmonary disease ([place]) 05/04/2012  . Essential (primary) hypertension 05/04/2012    Past Surgical History:  Procedure Laterality Date  . HIP SURGERY Right   . TUMOR REMOVAL     as a baby; chest    Prior to Admission medications     Medication Sig Start Date End Date Taking? Authorizing Provider  acetaminophen (TYLENOL) 500 MG tablet Take 1,000 mg by mouth every 8 (eight) hours as needed.    Historical Provider, MD  albuterol (PROVENTIL HFA;VENTOLIN HFA) 108 (90 Base) MCG/ACT inhaler Inhale 1-2 puffs into the lungs every 6 (six) hours as needed for wheezing or shortness of breath.    Historical Provider, MD  ALPRAZolam Duanne Moron) 0.25 MG tablet Take 1 tablet (0.25 mg total) by mouth 3 (three) times daily as needed for anxiety. 12/12/15   Demetrios Loll, MD  ALPRAZolam Duanne Moron) 0.25 MG tablet Take 1 tablet (0.25 mg total) by mouth 3 (three) times daily as needed for anxiety. 12/25/15   Lytle Butte, MD  Amino Acids-Protein Hydrolys (FEEDING SUPPLEMENT, PRO-STAT SUGAR FREE 64,) LIQD Take 30 mLs by mouth 2 (two) times daily. 12/25/15   Lytle Butte, MD  aspirin EC 325 MG tablet Take 325 mg by mouth daily.    Historical Provider, MD  B Complex-C (B-COMPLEX WITH VITAMIN C) tablet Take 1 tablet by mouth daily.    Historical Provider, MD  cyclobenzaprine (FLEXERIL) 5 MG tablet Take 1 tablet (5 mg total) by mouth 3 (three) times daily as needed for muscle spasms. 12/25/15   Lytle Butte, MD  docusate sodium (COLACE) 100 MG capsule Take 100 mg by mouth 2 (two) times daily as needed for mild constipation.    Historical Provider, MD  magnesium hydroxide (MILK OF MAGNESIA) 400 MG/5ML suspension Take 30 mLs by mouth every 4 (four)  hours as needed for mild constipation.    Historical Provider, MD  mirtazapine (REMERON) 15 MG tablet Take 1 tablet (15 mg total) by mouth at bedtime. 12/25/15   Lytle Butte, MD  Multiple Vitamin (MULTIVITAMIN WITH MINERALS) TABS tablet Take 1 tablet by mouth daily.    Historical Provider, MD  ondansetron (ZOFRAN) 4 MG tablet Take 4 mg by mouth every 6 (six) hours as needed for nausea or vomiting.    Historical Provider, MD  oxyCODONE (OXY IR/ROXICODONE) 5 MG immediate release tablet Take 1-2 tablets (5-10 mg total) by  mouth every 4 (four) hours as needed for moderate pain or breakthrough pain. 12/25/15   Lytle Butte, MD    Allergies Codeine; Penicillins; and Prednisone  Family History  Problem Relation Age of Onset  . Hypertension Other     Social History Social History  Substance Use Topics  . Smoking status: Former Smoker    Quit date: 11/11/1990  . Smokeless tobacco: Never Used  . Alcohol use Yes    Review of Systems Constitutional: No fever/chills Eyes: No visual changes. ENT: No sore throat. Cardiovascular: Denies chest pain. Respiratory: Denies shortness of breath.Had recent pneumonia but this is better. Gastrointestinal: No abdominal pain.  No nausea, no vomiting.  No diarrhea.  No constipation. Genitourinary: Negative for dysuria. Musculoskeletal: Negative for back pain. Skin: Negative for rash though she does have chronic sores over the right lower leg which are healing slowly, also a pressure ulcer over the buttock which is slowly healing. See history of present illness Neurological: Negative for headaches, focal weakness or numbness.  10-point ROS otherwise negative.  ____________________________________________   PHYSICAL EXAM:  VITAL SIGNS: ED Triage Vitals  Enc Vitals Group     BP 01/01/16 1519 (!) 126/59     Pulse Rate 01/01/16 1519 93     Resp 01/01/16 1519 16     Temp 01/01/16 1519 97.5 F (36.4 C)     Temp Source 01/01/16 1519 Oral     SpO2 01/01/16 1519 100 %     Weight 01/01/16 1519 130 lb (59 kg)     Height --      Head Circumference --      Peak Flow --      Pain Score 01/01/16 1520 6     Pain Loc --      Pain Edu? --      Excl. in [place]? --     Constitutional: Alert and oriented. Well appearing and in no acute distress. Eyes: Conjunctivae are normal. PERRL. EOMI. Head: Atraumatic. Nose: No congestion/rhinnorhea. Mouth/Throat: Mucous membranes are Slightly dry.  Oropharynx non-erythematous. Neck: No stridor.   Cardiovascular: Normal rate, regular  rhythm. Grossly normal heart sounds.  Good peripheral circulation.Careful auscultation was performed of the heart, no murmur is noted. Respiratory: Normal respiratory effort.  No retractions. Lungs CTAB. Gastrointestinal: Soft and nontender. No distention.  Musculoskeletal: No lower extremity tenderness nor edema except for over the right lower leg where the patient has a chronic non-healing skin breakdown.  No joint effusions.  Right hand Median, ulnar, radial motor intact. Cap refill less than 2 seconds all digits. Strong radial pulse. 5 out of 5 strength throughout the hand intrinsics, flexion and extension at the wrist. No evidence of trauma. Patient does have a large area of induration and purulence below the skin under the thumb pad which appears consistent with a felon. In addition the patient has multiple small nodular lesions on the distal tips of additional  fingers  Left hand Median, ulnar, radial motor intact. Cap refill less than 2 seconds all digits. Strong radial pulse. 5 out of 5 strength throughout the hand intrinsics, flexion and extension at the wrist. No evidence of trauma. Small nodular, questionably micro-pustular lesions noted on the tips of the left finger. ____________________________________________   Neurologic:  Normal speech and language. No gross focal neurologic deficits are appreciated. No gait instability. Skin:  Skin is warm, dry and intact. No rash noted. Psychiatric: Mood and affect are normal. Speech and behavior are normal.  ____________________________________________   LABS (all labs ordered are listed, but only abnormal results are displayed)  Labs Reviewed  CBC - Abnormal; Notable for the following:       Result Value   RBC 3.07 (*)    Hemoglobin 8.4 (*)    HCT 25.4 (*)    RDW 16.3 (*)    Platelets 541 (*)    All other components within normal limits  BASIC METABOLIC PANEL - Abnormal; Notable for the following:    Sodium 134 (*)    Potassium  5.7 (*)    Chloride 95 (*)    Glucose, Bld 100 (*)    BUN 47 (*)    Calcium 8.8 (*)    All other components within normal limits  CULTURE, BLOOD (ROUTINE X 2)  CULTURE, BLOOD (ROUTINE X 2)  POTASSIUM   ____________________________________________  EKG  Reviewed and interpreted by me at 1756 Normal sinus Left bundle branch block QRS 1:30 QTC 500 Heart rate 90 Compared with the patient's previous EKG  from 12/22/2015, no significant change other than slight decrease in rate. In review the EKG it does not appear that there is acute change in the T-wave pattern to suggest obvious hyperkalemia by ECG as her old seems very similar. ____________________________________________  RADIOLOGY   ____________________________________________   PROCEDURES  Procedure(s) performed: incision drainage  INCISION AND DRAINAGE Performed by: Delman Kitten Consent: Verbal consent obtained. Risks and benefits: risks, benefits and alternatives were discussed Type: abscess  Body area: Right hand distal thumb tuft  Anesthesia: local infiltration  Incision was made with a scalpel.  Local anesthetic: lidocaine 1% no epinephrine, digital block with good effect   Anesthetic total: 2 ml  Complexity: complex Blunt dissection to break up loculations  Drainage: purulent  Drainage amount: 10 ml  Patient tolerance: Patient tolerated the procedure well with no immediate complications.     Procedures  Critical Care performed: No  ____________________________________________   INITIAL IMPRESSION / ASSESSMENT AND PLAN / ED COURSE  Pertinent labs & imaging results that were available during my care of the patient were reviewed by me and considered in my medical decision making (see chart for details).  Patient presents for hand lesions,  Clinical Course   ----------------------------------------- 4:57 PM on 01/01/2016 -----------------------------------------  Potassium noted to be  elevated at 5.7. We'll obtain EKG to evaluate. Reviewed patient's medication list, I do not see any potassium supplementation or potassium sparing medications that would account for this.  The patient has multiple skin nodules concerning for infectious etiology on both hands. A large felon which has been drained, and I'm concerned about the possibility of bacteremia or endocarditis or other showering septic emboli given the presentation. Discussed with Dr. Sabra Heck who agrees with broad-spectrum IV antibiotics and suggest vancomycin, have discussed with the hospitalist service will admit the patient for further workup.  Concerned the patient did not show improvement on outpatient antibiotics, now presenting with an obvious felon. The  patient tolerated treatment well.  ----------------------------------------- 6:08 PM on 01/01/2016 -----------------------------------------  Patient resting comfortably. Agreeable with plan for admission for consultation by orthopedics and infectious disease for high risk and concern for bacteremia. Possibly due to the slow or open wounds over the right lower leg after surgery. She also has a mild pressure ulcer over the sacrum noted.  ____________________________________________   FINAL CLINICAL IMPRESSION(S) / ED DIAGNOSES  Final diagnoses:  Felon, right  Hyperkalemia      NEW MEDICATIONS STARTED DURING THIS VISIT:  New Prescriptions   No medications on file     Note:  This document was prepared using Dragon voice recognition software and may include unintentional dictation errors.     Delman Kitten, MD 01/01/16 [phone]

## 2016-01-01 NOTE — ED Triage Notes (Signed)
Pt reports "blisters" to hands x3 days ago; pt with large pocket of serosanguinous fluid to right thumb, small abscess to top of right hand and distal part of right pointer finger. Pt also with small blister appearing objects to left pointer finger.

## 2016-01-01 NOTE — Progress Notes (Signed)
ANTIBIOTIC CONSULT NOTE - INITIAL  Pharmacy Consult for Vancomycin  Indication: tenosynovitis, bacteremia  Allergies  Allergen Reactions  . Codeine Other (See Comments)    Itching and "feels like worms crawling in body".  . Penicillins Rash    Has patient had a PCN reaction causing immediate rash, facial/tongue/throat swelling, SOB or lightheadedness with hypotension: No  Has patient had a PCN reaction causing severe rash involving mucus membranes or skin necrosis: No Has patient had a PCN reaction that required hospitalization: No   Has patient had a PCN reaction occurring within the last 10 years: No  If all of the above answers are "NO", then may proceed with Cephalosporin use.   . Prednisone Palpitations    Patient Measurements: Height: 5\' 8"  (172.7 cm) Weight: 110 lb 3.2 oz (50 kg) IBW/kg (Calculated) : 63.9 Adjusted Body Weight: 58.3 kg   Vital Signs: Temp: 98 F (36.7 C) (08/07 2029) Temp Source: Oral (08/07 2029) BP: 142/70 (08/07 2029) Pulse Rate: 89 (08/07 2029) Intake/Output from previous day: No intake/output data recorded. Intake/Output from this shift: No intake/output data recorded.  Labs:  Recent Labs  01/01/16 1548  WBC 5.6  HGB 8.4*  PLT 541*  CREATININE 0.74   Estimated Creatinine Clearance: 48 mL/min (by C-G formula based on SCr of 0.8 mg/dL). No results for input(s): VANCOTROUGH, VANCOPEAK, VANCORANDOM, GENTTROUGH, GENTPEAK, GENTRANDOM, TOBRATROUGH, TOBRAPEAK, TOBRARND, AMIKACINPEAK, AMIKACINTROU, AMIKACIN in the last 72 hours.   Microbiology: No results found for this or any previous visit (from the past 720 hour(s)).  Medical History: Past Medical History:  Diagnosis Date  . CHF (congestive heart failure) ([place])   . COPD with emphysema ([place])   . Hypertension     Medications:  Prescriptions Prior to Admission  Medication Sig Dispense Refill Last Dose  . acetaminophen (TYLENOL) 500 MG tablet Take 1,000 mg by mouth every 8 (eight)  hours as needed.   prn at prn  . albuterol (PROVENTIL HFA;VENTOLIN HFA) 108 (90 Base) MCG/ACT inhaler Inhale 1-2 puffs into the lungs every 6 (six) hours as needed for wheezing or shortness of breath.   prn at prn  . ALPRAZolam (XANAX) 0.25 MG tablet Take 1 tablet (0.25 mg total) by mouth 3 (three) times daily as needed for anxiety. 30 tablet 0 prn at prn  . aspirin EC 325 MG tablet Take 325 mg by mouth daily.   12/31/2015 at Unknown time  . cyclobenzaprine (FLEXERIL) 5 MG tablet Take 1 tablet (5 mg total) by mouth 3 (three) times daily as needed for muscle spasms. 30 tablet 0 prn at prn  . mirtazapine (REMERON) 15 MG tablet Take 1 tablet (15 mg total) by mouth at bedtime. 30 tablet 0 12/31/2015 at Unknown time  . Multiple Vitamin (MULTIVITAMIN WITH MINERALS) TABS tablet Take 1 tablet by mouth daily.   12/31/2015 at Unknown time  . oxyCODONE (OXY IR/ROXICODONE) 5 MG immediate release tablet Take 1-2 tablets (5-10 mg total) by mouth every 4 (four) hours as needed for moderate pain or breakthrough pain. 30 tablet 0 prn at prn  . Amino Acids-Protein Hydrolys (FEEDING SUPPLEMENT, PRO-STAT SUGAR FREE 64,) LIQD Take 30 mLs by mouth 2 (two) times daily. 900 mL 0   . B Complex-C (B-COMPLEX WITH VITAMIN C) tablet Take 1 tablet by mouth daily.   Not Taking at Unknown time  . docusate sodium (COLACE) 100 MG capsule Take 100 mg by mouth 2 (two) times daily as needed for mild constipation.   Not Taking at Unknown time  .  magnesium hydroxide (MILK OF MAGNESIA) 400 MG/5ML suspension Take 30 mLs by mouth every 4 (four) hours as needed for mild constipation.   Not Taking at Unknown time  . ondansetron (ZOFRAN) 4 MG tablet Take 4 mg by mouth every 6 (six) hours as needed for nausea or vomiting.   Not Taking at Unknown time   Assessment: CrCl = 48 ml/min Ke = 0.044 hr-1 T1/2 = 15.8 hrs Vd = 35 L   Goal of Therapy:  Vancomycin trough level 15-20 mcg/ml  Plan:  Expected duration 7 days with resolution of temperature  and/or normalization of WBC   Vancomycin 1 gm IV X 1 given on 8/7 @ 19:00 Vancomycin 750 mg IV Q18H ordered to start 8/8 @ 0700, ~ 12 hrs after 1st dose (stacked dosing).  This pt will reach [facility] by 8/10 @ 19:00. Will draw 1st trough on 8/11 @ 0630, which will be at [facility].   Bakary Bramer D 01/01/2016,8:44 PM

## 2016-01-01 NOTE — ED Notes (Signed)
Right thumb I&D by MD; soaked in sterile water and iodine bath. Thumb wrapped in vasoline gauze and splinted with thumb splint.

## 2016-01-02 ENCOUNTER — Encounter: Payer: Self-pay | Admitting: *Deleted

## 2016-01-02 ENCOUNTER — Other Ambulatory Visit: Payer: Self-pay | Admitting: *Deleted

## 2016-01-02 ENCOUNTER — Observation Stay: Payer: Commercial Managed Care - HMO

## 2016-01-02 LAB — CBC
HEMATOCRIT: 23.9 % — AB (ref 35.0–47.0)
Hemoglobin: 7.8 g/dL — ABNORMAL LOW (ref 12.0–16.0)
MCH: 27 pg (ref 26.0–34.0)
MCHC: 32.8 g/dL (ref 32.0–36.0)
MCV: 82.5 fL (ref 80.0–100.0)
Platelets: 519 10*3/uL — ABNORMAL HIGH (ref 150–440)
RBC: 2.9 MIL/uL — ABNORMAL LOW (ref 3.80–5.20)
RDW: 16.6 % — AB (ref 11.5–14.5)
WBC: 4.7 10*3/uL (ref 3.6–11.0)

## 2016-01-02 LAB — URIC ACID: Uric Acid, Serum: 7.3 mg/dL — ABNORMAL HIGH (ref 2.3–6.6)

## 2016-01-02 LAB — BASIC METABOLIC PANEL
ANION GAP: 7 (ref 5–15)
BUN: 41 mg/dL — AB (ref 6–20)
CHLORIDE: 100 mmol/L — AB (ref 101–111)
CO2: 28 mmol/L (ref 22–32)
Calcium: 8.6 mg/dL — ABNORMAL LOW (ref 8.9–10.3)
Creatinine, Ser: 0.74 mg/dL (ref 0.44–1.00)
GFR calc Af Amer: >60 mL/min (ref >60)
GFR calc non Af Amer: >60 mL/min (ref >60)
GLUCOSE: 87 mg/dL (ref 65–99)
Potassium: 5.1 mmol/L (ref 3.5–5.1)
Sodium: 135 mmol/L (ref 135–145)

## 2016-01-02 LAB — FERRITIN: FERRITIN: 66 ng/mL (ref 11–307)

## 2016-01-02 LAB — SEDIMENTATION RATE: Sed Rate: 81 mm/hr — ABNORMAL HIGH (ref 0–30)

## 2016-01-02 LAB — MRSA PCR SCREENING: MRSA by PCR: NEGATIVE

## 2016-01-02 MED ORDER — DICLOFENAC SODIUM 25 MG PO TBEC
25.0000 mg | DELAYED_RELEASE_TABLET | Freq: Two times a day (BID) | ORAL | Status: DC
  Administered 2016-01-02 – 2016-01-04 (×5): 25 mg via ORAL
  Filled 2016-01-02 (×6): qty 1

## 2016-01-02 MED ORDER — COLCHICINE 0.6 MG PO TABS
0.6000 mg | ORAL_TABLET | Freq: Two times a day (BID) | ORAL | Status: DC
  Administered 2016-01-03 – 2016-01-04 (×3): 0.6 mg via ORAL
  Filled 2016-01-02 (×3): qty 1

## 2016-01-02 MED ORDER — METHYLPREDNISOLONE SODIUM SUCC 40 MG IJ SOLR
20.0000 mg | Freq: Once | INTRAMUSCULAR | Status: AC
  Administered 2016-01-02: 20 mg via INTRAVENOUS
  Filled 2016-01-02: qty 1

## 2016-01-02 MED ORDER — FUROSEMIDE 20 MG PO TABS
20.0000 mg | ORAL_TABLET | Freq: Every day | ORAL | Status: DC | PRN
Start: 2016-01-02 — End: 2016-01-04
  Administered 2016-01-02 – 2016-01-03 (×3): 20 mg via ORAL
  Filled 2016-01-02 (×3): qty 1

## 2016-01-02 MED ORDER — COLCHICINE 0.6 MG PO TABS
0.6000 mg | ORAL_TABLET | ORAL | Status: AC
  Administered 2016-01-02 (×3): 0.6 mg via ORAL
  Filled 2016-01-02 (×3): qty 1

## 2016-01-02 MED ORDER — VANCOMYCIN HCL IN DEXTROSE 750-5 MG/150ML-% IV SOLN
750.0000 mg | INTRAVENOUS | Status: DC
  Administered 2016-01-02 – 2016-01-03 (×2): 750 mg via INTRAVENOUS
  Filled 2016-01-02 (×3): qty 150

## 2016-01-02 MED ORDER — ENOXAPARIN SODIUM 40 MG/0.4ML ~~LOC~~ SOLN
40.0000 mg | SUBCUTANEOUS | Status: DC
  Administered 2016-01-02 – 2016-01-03 (×2): 40 mg via SUBCUTANEOUS
  Filled 2016-01-02 (×2): qty 0.4

## 2016-01-02 NOTE — Consult Note (Signed)
[place] Nurse wound consult note Reason for Consult:Medical Adhesive Related Skin Injury (MARSI) to right lower leg, medial and lateral aspect.  States this began with tape being removed from legs.  Left knee, skin tear (MARSI) Wound type:Trauma (MARSI) Stage 2 sacrum  Pressure Ulcer POA: Yes   Measurement:Sacrum 2 cm x 2 cm x 0.1 cm Left knee 1 cm x 1.5 cm x 0.1 cm approximated skin tear Right lower leg, lateral aspect 1 cm x 1 cm x 0.1 cm  Right medial lower leg 5 cm x 1 cm x 0.1 cm  Wound IT:[id number] red, bleeds easily Drainage (amount, consistency, odor) Bloody drainage  No odor.   Periwound:Ecchymosis, thin fragile skin.  Dressing procedure/placement/frequency:Cleanse sacrum with NS and pat gently dry.  Apply Sacral silicone border foam.  CHange every 3 days and PRN soilage.  Change position every 2 hours.  Left knee, right lower leg abrasions:  Cleanse with Ns and pat gently dry.  Apply AquaceL Ag (may moisten with NS).  Cover with gauze and kerlix.  Secure with tape,  No tape on skin. Change Tues/Thurs/saturday.  Will not follow at this time.  Please re-consult if needed.  Domenic Moras RN BSN [place] Pager [phone]

## 2016-01-02 NOTE — Consult Note (Signed)
[facility] Infectious Disease     Reason for Consult: tenosynovitis   Referring Physician: Weiting, R Date of Admission:  01/01/2016   Active Problems:   Infectious tenosynovitis of wrist extensor   HPI: Jacqueline Boyd is a 75 y.o. female admitted [facility]/7 from SNF with infected painful fingers. Has had 2 recent admissions in July and had hipr replacement several months ago On admit no fevers, wbc 4.7. Started vanco and cipro.  She has a hx of gout many years ago.  Past Medical History:  Diagnosis Date  . CHF (congestive heart failure) (HCC)   . COPD with emphysema (HCC)   . Hypertension    Past Surgical History:  Procedure Laterality Date  . HIP SURGERY Right   . TUMOR REMOVAL     as a baby; chest   Social History  Substance Use Topics  . Smoking status: Former Smoker    Quit date: 11/11/1990  . Smokeless tobacco: Never Used  . Alcohol use Yes   Family History  Problem Relation Age of Onset  . Hypertension Other     Allergies:  Allergies  Allergen Reactions  . Codeine Other (See Comments)    Itching and "feels like worms crawling in body".  . Penicillins Rash    Has patient had a PCN reaction causing immediate rash, facial/tongue/throat swelling, SOB or lightheadedness with hypotension: No  Has patient had a PCN reaction causing severe rash involving mucus membranes or skin necrosis: No Has patient had a PCN reaction that required hospitalization: No   Has patient had a PCN reaction occurring within the last 10 years: No  If all of the above answers are "NO", then may proceed with Cephalosporin use.   . Prednisone Palpitations    Current antibiotics: Antibiotics Given (last 72 hours)    Date/Time Action Medication Dose Rate   01/01/16 2114 Given   ciprofloxacin (CIPRO) IVPB 400 mg 400 mg 200 mL/hr   01/02/16 0825 Given   ciprofloxacin (CIPRO) IVPB 400 mg 400 mg 200 mL/hr   01/02/16 1037 Given   vancomycin (VANCOCIN) IVPB 750 mg/150 ml premix 750 mg 150 mL/hr       MEDICATIONS: . B-complex with vitamin C  1 tablet Oral Daily  . ciprofloxacin  400 mg Intravenous Q12H  . docusate sodium  100 mg Oral BID  . feeding supplement (PRO-STAT SUGAR FREE 64)  30 mL Oral BID  . heparin  5,000 Units Subcutaneous Q8H  . mirtazapine  15 mg Oral QHS  . multivitamin with minerals  1 tablet Oral Daily  . vancomycin  750 mg Intravenous Q18H    Review of Systems - 11 systems reviewed and negative per HPI   OBJECTIVE: Temp:  [97.5 F (36.4 C)-98.3 F (36.8 C)] 97.6 F (36.4 C) (08/08 0813) Pulse Rate:  [76-97] 93 (08/08 0813) Resp:  [16-21] 20 (08/08 0813) BP: (126-148)/(59-74) 148/74 (08/08 0813) SpO2:  [93 %-100 %] 97 % (08/08 0813) Weight:  [49.9 kg (110 lb)-59 kg (130 lb)] 49.9 kg (110 lb) (08/08 0447) Physical Exam  Constitutional:  oriented to person, place, and time. Thin, chronically ill appearing HENT: [place]/AT, PERRLA, no scleral icterus Mouth/Throat: Oropharynx is clear and moist. No oropharyngeal exudate.  Cardiovascular: Normal rate, regular rhythm and normal heart sounds.Pulmonary/Chest: Effort normal and breath sounds normal. No respiratory distress.  has no wheezes.  Neck = supple, no nuchal rigidity Abdominal: Soft. Bowel sounds are normal.  exhibits no distension. There is no tenderness.  Lymphadenopathy: no cervical adenopathy. No   axillary adenopathy Neurological: alert and oriented to person, place, and time.  Skin: L thumb with swelling and open incision site with some ss drainage. Has obvious gout deposits at base of this wound and also on several other fingers and dorsum of hand  LABS: Results for orders placed or performed during the hospital encounter of 01/01/16 (from the past 48 hour(s))  MRSA PCR Screening     Status: None   Collection Time: 01/01/16  6:20 AM  Result Value Ref Range   MRSA by PCR NEGATIVE NEGATIVE    Comment:        The GeneXpert MRSA Assay (FDA approved for NASAL specimens only), is one component of  a comprehensive MRSA colonization surveillance program. It is not intended to diagnose MRSA infection nor to guide or monitor treatment for MRSA infections.   CBC     Status: Abnormal   Collection Time: 01/01/16  3:48 PM  Result Value Ref Range   WBC 5.6 3.6 - 11.0 K/uL   RBC 3.07 (L) 3.80 - 5.20 MIL/uL   Hemoglobin 8.4 (L) 12.0 - 16.0 g/dL   HCT 25.4 (L) 35.0 - 47.0 %   MCV 82.8 80.0 - 100.0 fL   MCH 27.4 26.0 - 34.0 pg   MCHC 33.0 32.0 - 36.0 g/dL   RDW 16.3 (H) 11.5 - 14.5 %   Platelets 541 (H) 150 - 440 K/uL  Basic metabolic panel     Status: Abnormal   Collection Time: 01/01/16  3:48 PM  Result Value Ref Range   Sodium 134 (L) 135 - 145 mmol/L   Potassium 5.7 (H) 3.5 - 5.1 mmol/L   Chloride 95 (L) 101 - 111 mmol/L   CO2 29 22 - 32 mmol/L   Glucose, Bld 100 (H) 65 - 99 mg/dL   BUN 47 (H) 6 - 20 mg/dL   Creatinine, Ser 0.74 0.44 - 1.00 mg/dL   Calcium 8.8 (L) 8.9 - 10.3 mg/dL   GFR calc non Af Amer >60 >60 mL/min   GFR calc Af Amer >60 >60 mL/min    Comment: (NOTE) The eGFR has been calculated using the CKD EPI equation. This calculation has not been validated in all clinical situations. eGFR's persistently <60 mL/min signify possible Chronic Kidney Disease.    Anion gap 10 5 - 15  Potassium     Status: Abnormal   Collection Time: 01/01/16  6:42 PM  Result Value Ref Range   Potassium 5.8 (H) 3.5 - 5.1 mmol/L  Basic metabolic panel     Status: Abnormal   Collection Time: 01/02/16  4:54 AM  Result Value Ref Range   Sodium 135 135 - 145 mmol/L   Potassium 5.1 3.5 - 5.1 mmol/L   Chloride 100 (L) 101 - 111 mmol/L   CO2 28 22 - 32 mmol/L   Glucose, Bld 87 65 - 99 mg/dL   BUN 41 (H) 6 - 20 mg/dL   Creatinine, Ser 0.74 0.44 - 1.00 mg/dL   Calcium 8.6 (L) 8.9 - 10.3 mg/dL   GFR calc non Af Amer >60 >60 mL/min   GFR calc Af Amer >60 >60 mL/min    Comment: (NOTE) The eGFR has been calculated using the CKD EPI equation. This calculation has not been validated in all  clinical situations. eGFR's persistently <60 mL/min signify possible Chronic Kidney Disease.    Anion gap 7 5 - 15  CBC     Status: Abnormal   Collection Time: 01/02/16  4:54 AM    Result Value Ref Range   WBC 4.7 3.6 - 11.0 K/uL   RBC 2.90 (L) 3.80 - 5.20 MIL/uL   Hemoglobin 7.8 (L) 12.0 - 16.0 g/dL   HCT 23.9 (L) 35.0 - 47.0 %   MCV 82.5 80.0 - 100.0 fL   MCH 27.0 26.0 - 34.0 pg   MCHC 32.8 32.0 - 36.0 g/dL   RDW 16.6 (H) 11.5 - 14.5 %   Platelets 519 (H) 150 - 440 K/uL  Sedimentation rate     Status: Abnormal   Collection Time: 01/02/16  4:54 AM  Result Value Ref Range   Sed Rate 81 (H) 0 - 30 mm/hr  Uric acid     Status: Abnormal   Collection Time: 01/02/16  4:54 AM  Result Value Ref Range   Uric Acid, Serum 7.3 (H) 2.3 - 6.6 mg/dL   No components found for: ESR, C REACTIVE PROTEIN MICRO: Recent Results (from the past 720 hour(s))  MRSA PCR Screening     Status: None   Collection Time: 01/01/16  6:20 AM  Result Value Ref Range Status   MRSA by PCR NEGATIVE NEGATIVE Final    Comment:        The GeneXpert MRSA Assay (FDA approved for NASAL specimens only), is one component of a comprehensive MRSA colonization surveillance program. It is not intended to diagnose MRSA infection nor to guide or monitor treatment for MRSA infections.     IMAGING: Dg Chest 2 View  Result Date: 12/22/2015 CLINICAL DATA:  Weakness and nausea. EXAM: CHEST  2 VIEW COMPARISON:  12/09/2015 FINDINGS: Cardiomediastinal silhouette is normal. Mediastinal contours appear intact. Calcific atherosclerotic disease of the aortic arch noted. There is no evidence of focal airspace consolidation, pleural effusion or pneumothorax. Lungs are hyperinflated. Osseous structures are without acute abnormality. Soft tissues are grossly normal. IMPRESSION: No active cardiopulmonary disease. Atherosclerotic disease of the aorta. Chronic hyperinflation of the lungs. Electronically Signed   By: Dobrinka  Dimitrova M.D.    On: 12/22/2015 13:00  Dg Chest 2 View  Result Date: 12/09/2015 CLINICAL DATA:  Generalized weakness and pt states some SOB. Hx of CHF, HTN and COPD. Former smoker. 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 There is perihilar peribronchial thickening. No focal consolidations or pleural effusions. The heart size is normal. IMPRESSION: Hyperinflation and bronchitic changes. No focal acute pulmonary abnormality. Electronically Signed   By: Elizabeth  Brown M.D.   On: 12/09/2015 17:09   Ct Angio Chest Pe W And/or Wo Contrast  Result Date: 12/22/2015 CLINICAL DATA:  74-year-old female with shortness of breath for 3 days. Recent hip replacement. EXAM: CT ANGIOGRAPHY CHEST WITH CONTRAST TECHNIQUE: Multidetector CT imaging of the chest was performed using the standard protocol during bolus administration of intravenous contrast. Multiplanar CT image reconstructions and MIPs were obtained to evaluate the vascular anatomy. CONTRAST:  75 cc intravenous Isovue 370 COMPARISON:  12/22/2015 and prior radiographs.  08/01/2012 chest CT FINDINGS: Cardiovascular: This is a technically satisfactory study. No pulmonary emboli are identified. Thoracic aortic atherosclerotic calcifications noted without aneurysm or definite dissection. Cardiomegaly and coronary artery calcifications are present. Mediastinum/Nodes: No mediastinal mass, enlarged lymph nodes or pericardial effusion. Lungs/Pleura: Mild centrilobular emphysema noted. There is no evidence of airspace disease, consolidation, mass, suspicious nodule, pleural effusion or pneumothorax. Minimal bibasilar and right upper lobe scarring noted. Upper Abdomen: No acute abnormality. Musculoskeletal: No acute or suspicious abnormalities identified. Review of the MIP images   confirms the above findings. IMPRESSION: No acute abnormality. No evidence of pulmonary emboli or thoracic aortic aneurysm/definite  dissection. Cardiomegaly and coronary disease. Mild centrilobular emphysema. Electronically Signed   By: Jeffrey  Hu M.D.   On: 12/22/2015 17:05  Dg Hip Unilat With Pelvis 2-3 Views Left  Result Date: 12/09/2015 CLINICAL DATA:  74-year-old female with chronic left hip pain. EXAM: DG HIP (WITH OR WITHOUT PELVIS) 2-3V LEFT COMPARISON:  06/15/2015 CT FINDINGS: Right total hip arthroplasty now identified. There is no acute fracture noted. Severe arthropathic changes within the left hip again noted with severe joint space loss, axial migration, subchondral sclerosis, cyst formation and severe acetabular protrusio. IMPRESSION: No evidence of acute abnormality. 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 Electronically Signed   By: Jeffrey  Hu M.D.   On: 12/09/2015 18:03    Assessment:   Jacqueline Boyd is a 75 y.o. 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  Not septic, no pus but mild ss drainage has been cultured.  Recommendations Cont vanco pending cx- dc clinda and vanco Start gout treatment with colchine 0.6 q 3 hrs x 3 doses today then bid  Will need long term treatment with allopurinol once acute flare resolved. Thank you very much for allowing me to participate in the care of this patient. Please call with questions.   David P. Fitzgerald, MD  

## 2016-01-02 NOTE — Care Management Obs Status (Signed)
[facility] NOTIFICATION   Patient Details  Name: Jacqueline Boyd MRN: [id number] Date of Birth: 05/12/1941   Medicare Observation Status Notification Given:  Yes    Alvie Heidelberg, RN 01/02/2016, 11:01 AM

## 2016-01-02 NOTE — Progress Notes (Addendum)
[facility] PROGRESS NOTE  Jacqueline Boyd [id number] DOB: 1941-02-20 DOA: 01/01/2016 PCP: Marinda Elk, MD  HPI/Subjective: Patient complains of hand and finger pain. She was sent into the emergency room for further evaluation for possible infection. Patient states that she's had nonhealing lesions on her legs.  Objective: Vitals:   01/02/16 0447 01/02/16 0813  BP: 140/63 (!) 148/74  Pulse: 76 93  Resp: 18 20  Temp: 98.3 F (36.8 C) 97.6 F (36.4 C)    Filed Weights   01/01/16 1727 01/01/16 2029 01/02/16 0447  Weight: 59 kg (130 lb) 50 kg (110 lb 3.2 oz) 49.9 kg (110 lb)    ROS: Review of Systems  Constitutional: Negative for chills and fever.  Eyes: Negative for blurred vision.  Respiratory: Negative for cough and shortness of breath.   Cardiovascular: Negative for chest pain.  Gastrointestinal: Negative for abdominal pain, constipation, diarrhea, nausea and vomiting.  Genitourinary: Negative for dysuria.  Musculoskeletal: Positive for joint pain.  Neurological: Negative for dizziness and headaches.   Exam: Physical Exam  Constitutional: She is oriented to person, place, and time.  HENT:  Nose: No mucosal edema.  Mouth/Throat: No oropharyngeal exudate or posterior oropharyngeal edema.  Eyes: Conjunctivae, EOM and lids are normal. Pupils are equal, round, and reactive to light.  Neck: No JVD present. Carotid bruit is not present. No edema present. No thyroid mass and no thyromegaly present.  Cardiovascular: S1 normal and S2 normal.  Exam reveals no gallop.   No murmur heard. Pulses:      Dorsalis pedis pulses are 2+ on the right side, and 2+ on the left side.  Respiratory: No respiratory distress. She has no wheezes. She has no rhonchi. She has no rales.  GI: Soft. Bowel sounds are normal. There is no tenderness.  Musculoskeletal:       Right ankle: She exhibits swelling.       Left ankle: She exhibits swelling.  Lymphadenopathy:    She has no  cervical adenopathy.  Neurological: She is alert and oriented to person, place, and time. No cranial nerve deficit.  Skin: Skin is warm. Nails show no clubbing.  Right thumb covered. On her other fingers it looks like tophaceous gout. Left thigh extremity looks like skin tears. Right lower leg large ulceration that looks like it's healing and no signs of infection there.  Psychiatric: She has a normal mood and affect.      Data Reviewed: Basic Metabolic Panel:  Recent Labs Lab 01/01/16 1548 01/01/16 1842 01/02/16 0454  NA 134*  --  135  K 5.7* 5.8* 5.1  CL 95*  --  100*  CO2 29  --  28  GLUCOSE 100*  --  87  BUN 47*  --  41*  CREATININE 0.74  --  0.74  CALCIUM 8.8*  --  8.6*   CBC:  Recent Labs Lab 01/01/16 1548 01/02/16 0454  WBC 5.6 4.7  HGB 8.4* 7.8*  HCT 25.4* 23.9*  MCV 82.8 82.5  PLT 541* 519*   BNP (last 3 results)  Recent Labs  12/22/15 1154  BNP 85.0     Recent Results (from the past 240 hour(s))  MRSA PCR Screening     Status: None   Collection Time: 01/01/16  6:20 AM  Result Value Ref Range Status   MRSA by PCR NEGATIVE NEGATIVE Final    Comment:        The GeneXpert MRSA Assay (FDA approved for NASAL specimens only), is one component  of a comprehensive MRSA colonization surveillance program. It is not intended to diagnose MRSA infection nor to guide or monitor treatment for MRSA infections.      Scheduled Meds: . B-complex with vitamin C  1 tablet Oral Daily  . [START ON 01/03/2016] colchicine  0.6 mg Oral BID  . colchicine  0.6 mg Oral Q3H  . diclofenac  25 mg Oral BID  . docusate sodium  100 mg Oral BID  . enoxaparin (LOVENOX) injection  40 mg Subcutaneous Q24H  . feeding supplement (PRO-STAT SUGAR FREE 64)  30 mL Oral BID  . mirtazapine  15 mg Oral QHS  . multivitamin with minerals  1 tablet Oral Daily  . vancomycin  750 mg Intravenous Q18H    Assessment/Plan:  1. Tophaceous gout fingers. Culture sent by infectious disease  doctor. Continue vancomycin until cultures are back. Colchicine ordered. I did order 20 mg of IV Solu-Medrol. Rheumatology consultation. Likely will need a uric acid lowering medication once acute pain is better. 2. Chronic COPD. No signs of exacerbation at this point. 3. History of congestive heart failure. No signs of congestive heart failure at this point. 4. Nonhealing right lower extremity leg wound. Needs to follow closely until healing. 5. Weakness. Currently at [facility] 6. Hyperkalemia. This has improved. 7. Anemia. Add on ferritin. Guaiac stools.  Code Status:     Code Status Orders        Start     Ordered   01/01/16 1852  Full code  Continuous     01/01/16 1856    Code Status History    Date Active Date Inactive Code Status Order ID Comments User Context   12/22/2015  9:01 PM 12/25/2015  9:57 PM Full Code [id number]  Henreitta Leber, MD Inpatient   12/09/2015  5:59 PM 12/12/2015  9:17 PM Full Code [id number]  Lytle Butte, MD ED   11/12/2015  3:18 AM 11/13/2015 10:54 PM Full Code [id number]  Harrie Foreman, MD Inpatient     Disposition Plan: Back to [facility] in the next day or so  Consultants:  Infectious disease consultation  Rheumatology consultation  Antibiotics:  Vancomycin  Time spent: 25 minutes  Loletha Grayer  [employer]

## 2016-01-02 NOTE — NC FL2 (Signed)
[place] LEVEL OF CARE SCREENING TOOL     IDENTIFICATION  Patient Name: Jacqueline Boyd Birthdate: 1940-08-17 Sex: female Admission Date (Current Location): 01/01/2016  [place] and [place] Number:  Engineering geologist and Address:  [facility], [address] [place], [place] [address]      Provider Number: [id number]  Attending Physician Name and Address:  Loletha Grayer, MD  Relative Name and Phone Number:       Current Level of Care: Hospital Recommended Level of Care: [place] Prior Approval Number:    Date Approved/Denied:   PASRR Number:  ( [id number] )  Discharge Plan: SNF    Current Diagnoses: Patient Active Problem List   Diagnosis Date Noted  . Infectious tenosynovitis of wrist extensor 01/01/2016  . Protein-calorie malnutrition, severe 12/11/2015  . Pressure ulcer 12/10/2015  . Hyponatremia 12/09/2015  . Chronic diastolic heart failure ([place]) 11/08/2015  . Pulmonary hypertension ([place]) 11/08/2015  . Degenerative arthritis of hip 11/06/2015  . 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 Degeneration of intervertebral disc of lumbar region 05/09/2015  . Neuritis or radiculitis due to rupture of lumbar intervertebral disc 05/09/2015  . LBP (low back pain) 12/27/2014  . Coxitis 02/03/2014  . Chronic obstructive pulmonary disease ([place]) 05/04/2012  . Essential (primary) hypertension 05/04/2012    Orientation RESPIRATION BLADDER Height & Weight     Self, Time, Situation, Place  Normal Continent Weight: 110 lb (49.9 kg) Height:  5\' 8"  (172.7 cm)  BEHAVIORAL SYMPTOMS/MOOD NEUROLOGICAL BOWEL NUTRITION STATUS   (none )  (none ) Continent Diet (Regular Diet )  AMBULATORY STATUS COMMUNICATION OF NEEDS Skin   Extensive Assist Verbally PU Stage and Appropriate Care, Surgical wounds (Pressure Ulcer Stage 2: Left Coccyx, Pressure Ulcer on Sacrum. )                       Personal Care Assistance Level of  Assistance  Bathing, Feeding, Dressing Bathing Assistance: Limited assistance Feeding assistance: Independent Dressing Assistance: Limited assistance     Functional Limitations Info  Sight, Hearing, Speech Sight Info: Adequate Hearing Info: Adequate Speech Info: Adequate    SPECIAL CARE FACTORS FREQUENCY  PT (By licensed PT), OT (By licensed OT)     PT Frequency:  (5) OT Frequency:  (5)            Contractures      Additional Factors Info  Code Status, Allergies Code Status Info:  (Full Code. ) Allergies Info:  (Codeine, Penicillins, Prednisone)           Current Medications (01/02/2016):  This is the current hospital active medication list Current Facility-Administered Medications  Medication Dose Route Frequency Provider Last Rate Last Dose  . acetaminophen (TYLENOL) tablet 650 mg  650 mg Oral Q6H PRN Epifanio Lesches, MD       Or  . acetaminophen (TYLENOL) suppository 650 mg  650 mg Rectal Q6H PRN Epifanio Lesches, MD      . albuterol (PROVENTIL) (2.5 MG/3ML) 0.083% nebulizer solution 2.5 mg  2.5 mg Inhalation Q6H PRN Epifanio Lesches, MD      . ALPRAZolam Duanne Moron) tablet 0.25 mg  0.25 mg Oral TID PRN Epifanio Lesches, MD   0.25 mg at 01/02/16 0924  . B-complex with vitamin C tablet 1 tablet  1 tablet Oral Daily Epifanio Lesches, MD      . bisacodyl (DULCOLAX) EC tablet 5 mg  5 mg Oral Daily PRN Epifanio Lesches, MD      .  ciprofloxacin (CIPRO) IVPB 400 mg  400 mg Intravenous Q12H Epifanio Lesches, MD   400 mg at 01/02/16 0825  . cyclobenzaprine (FLEXERIL) tablet 5 mg  5 mg Oral TID PRN Epifanio Lesches, MD   5 mg at 01/02/16 0924  . docusate sodium (COLACE) capsule 100 mg  100 mg Oral BID PRN Epifanio Lesches, MD      . docusate sodium (COLACE) capsule 100 mg  100 mg Oral BID Epifanio Lesches, MD   100 mg at 01/02/16 0924  . feeding supplement (PRO-STAT SUGAR FREE 64) liquid 30 mL  30 mL Oral BID Epifanio Lesches, MD      . heparin  injection 5,000 Units  5,000 Units Subcutaneous Q8H Epifanio Lesches, MD   5,000 Units at 01/02/16 0521  . magnesium hydroxide (MILK OF MAGNESIA) suspension 30 mL  30 mL Oral Q4H PRN Epifanio Lesches, MD      . mirtazapine (REMERON) tablet 15 mg  15 mg Oral QHS Epifanio Lesches, MD   15 mg at 01/01/16 2113  . multivitamin with minerals tablet 1 tablet  1 tablet Oral Daily Epifanio Lesches, MD   1 tablet at 01/02/16 0924  . ondansetron (ZOFRAN) tablet 4 mg  4 mg Oral Q6H PRN Epifanio Lesches, MD       Or  . ondansetron (ZOFRAN) injection 4 mg  4 mg Intravenous Q6H PRN Epifanio Lesches, MD      . oxyCODONE (Oxy IR/ROXICODONE) immediate release tablet 5-10 mg  5-10 mg Oral Q4H PRN Epifanio Lesches, MD   5 mg at 01/02/16 0924  . pentafluoroprop-tetrafluoroeth (GEBAUERS) aerosol   Topical PRN Delman Kitten, MD      . traZODone (DESYREL) tablet 25 mg  25 mg Oral QHS PRN Epifanio Lesches, MD      . vancomycin (VANCOCIN) IVPB 750 mg/150 ml premix  750 mg Intravenous Q18H Epifanio Lesches, MD         Discharge Medications: Please see discharge summary for a list of discharge medications.  Relevant Imaging Results:  Relevant Lab Results:   Additional Information  (SSN: [id number])  Jeanell Mangan, Veronia Beets, LCSW

## 2016-01-02 NOTE — Consult Note (Signed)
   [facility] CM Inpatient Consult   01/02/2016  Jacqueline Boyd [birth year] [id number]   Chart review revealed patient eligible for [place] Management services with a diagnosis of CHF and COPD for post hospital discharge follow up. Patient was evaluated for community based chronic disease management services with [employer] care Management Program as a benefit of patient's [name] Medicare. Met with the patient at the bedside to explain [place] Management services. Patient endorses her primary care provider to be [facility] Keane Scrape, NP.  Patient states she was previously residing at [facility] and plans to return there to complete rehabilitation, but her eventual plan is to return home. Consent form signed. Patient states her best contact number is [phone]. Patient also gave written permission to contact her son [name] at [phone] if she can not be reached. Patient will receive post hospital discharge calls and be evaluated for monthly home visits. [facility] Care Management services does not interfere with or replace any services arranged by the inpatient care management team. RNCM left contact information and [facility] literature at the bedside. Made inpatient RNCM aware that [facility] will be following for care management. For additional questions please contact:   Joshia Kitchings RN, [place] [facility] Liaison  [phone]) [employer] [phone]) Toll free office

## 2016-01-02 NOTE — Consult Note (Signed)
Reason for Consult: Gout  Referring Physician: Dr. Erskin Burnet   HPI: 75 year old white female. Prior caterer. History of significant arthritis the hips with protrusio. Had right hip involvement which was operated. Had bone graft. During that time she had wraps on her legs and developed of skin tears and's painful ulcers. She's been in a rest home. Recent hospitalization for hyponatremia. By her report in the last week she had sudden onset of painful nodules in left hand and right hand and right thumb. Send emergency room where his sedimentation rate was elevated white count normal at 4000 elevated platelets of 519,000. Uric acid 7.3. He was seen by infectious disease. She's had drainage. She's developed nodules in left hand and right and the left extensor wrist tendon. 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 Denies daily alcohol. No family history of gout. No kidney stones. Uric acid 7.3. 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 Hyponatremia.  SURGICAL HISTORY: Hip replacement  Family History: Negative for gout  Social History: Denies significant alcohol  Allergies:  Allergies  Allergen Reactions  . Codeine Other (See Comments)    Itching and "feels like worms crawling in body".  . Penicillins Rash    Has patient had a PCN reaction causing immediate rash, facial/tongue/throat swelling, SOB or lightheadedness with hypotension: No  Has patient had a PCN reaction causing severe rash involving mucus membranes or skin necrosis: No Has patient had a PCN reaction that required hospitalization: No   Has patient had a PCN reaction occurring within the last 10 years: No  If all of the above answers are "NO", then may proceed with Cephalosporin use.   . Prednisone Palpitations    Medications:  Scheduled: . B-complex with vitamin C  1 tablet Oral Daily  . [START ON 01/03/2016] colchicine  0.6 mg Oral BID  . colchicine  0.6 mg  Oral Q3H  . diclofenac  25 mg Oral BID  . docusate sodium  100 mg Oral BID  . enoxaparin (LOVENOX) injection  40 mg Subcutaneous Q24H  . feeding supplement (PRO-STAT SUGAR FREE 64)  30 mL Oral BID  . mirtazapine  15 mg Oral QHS  . multivitamin with minerals  1 tablet Oral Daily  . vancomycin  750 mg Intravenous Q18H        ROS: No chest pain. No weight loss. No fevers.   PHYSICAL EXAM: Blood pressure (!) 148/74, pulse 93, temperature 97.6 F (36.4 C), temperature source Oral, resp. rate 20, height 5\' 8"  (1.727 m), weight 49.9 kg (110 lb), SpO2 97 %. Pleasant ill-appearing female. Seen in a chair and then in the bed. Sclera clear. Clear chest. No murmur. No visceromegaly. Legs are wrapped in multiple areas are. There are several eschars unwrapped. Hands haven't whitish painful nodules in the left distal second finger third finger right thumb which is bandaged and is been operated with some drainage left third extensor and in. No elbow nodules. No nodules over the years. Feet without nodules. She has no significant synovitis of wrists knees or ankles. Decreased range of motion of left hip  Slide was taken of the right thumb discharge. 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

## 2016-01-02 NOTE — Evaluation (Addendum)
Physical Therapy Evaluation Patient Details Name: Jacqueline Boyd MRN: [id number] DOB: 1940/11/04 Today's Date: 01/02/2016   History of Present Illness  Pt is a 75 y/o female with infectious tenosynovitis of wrist extensor. 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 Per pt notes, pt has had R thumb pain for last 3 days with ulcers on R LE and sacrum. PMH includes COPD, CHF, OA, and HTN. Pt at [facility] prior to hospital stay.  Clinical Impression  Pt is a pleasant and cooperative 75 y/o female who presents with generalized weakness. PLOF: Pt at [facility] prior to coming in to hospital and was receiving physical therapy there. Prior to admission to [facility], pt was independent with AD, household ambulation. Pt requires mod assist for bed mobility and transfers and min assist for ambulation. Pt requires moderate physical assistance to scoot to EOB, primarily limited by pain in R LE and sacrum (ulcers). Requires minimal verbal cueing for proper UE placement with sit/stand transfers. Pt requires verbal and tactile cueing to decrease trunk flexion with ambulation with RW. Min assist to maintain balance with ambulation due to slight lateral sway. RN notified of pt request to change linens due to blood stains and to resume IV. Pt will benefit from skilled PT services in order to improve strength, balance, functional mobility, and safety with DME use. At this time, pt is appropriate to return to SNF to progress towards her goals.This entire session was guided, instructed, and directly supervised by Greggory Stallion, DPT.    Follow Up Recommendations SNF    Equipment Recommendations  None recommended by PT    Recommendations for Other Services       Precautions / Restrictions Precautions Precautions: Fall Restrictions Weight Bearing Restrictions: No      Mobility  Bed Mobility Overal bed mobility: Needs Assistance Bed Mobility: Supine to Sit     Supine to sit: Mod assist     General bed mobility comments: Pt requires verbal cueing  for sequencing of movement. Requires mod assist to scoot to EOB, pt reports increase R LE and sacrum pain with movement.   Transfers Overall transfer level: Needs assistance Equipment used: Rolling walker (2 wheeled) Transfers: Sit to/from Stand Sit to Stand: Mod assist         General transfer comment: Minimal-no verbal cueing for proper UE placement on RW for transfers. No LOB noted upon standing. Safe stand-sit transfer demonstrated with no verbal cueing needed to reach back for armrests.  Ambulation/Gait Ambulation/Gait assistance: Min assist Ambulation Distance (Feet): 30 Feet Assistive device: Rolling walker (2 wheeled) Gait Pattern/deviations: Decreased step length - right;Decreased step length - left;Decreased stance time - right;Antalgic;Trunk flexed     General Gait Details: Pt demonstrates safe ambulation with RW. Requires min assist to maintain balance. Verbal and tactile cueing to decrease trunk flexion.  Stairs            Wheelchair Mobility    Modified Rankin (Stroke Patients Only)       Balance Overall balance assessment: Needs assistance Sitting-balance support: Bilateral upper extremity supported;Feet supported Sitting balance-Leahy Scale: Good Sitting balance - Comments: Pt able to maintain sitting balance with min guard wiht B UE and LE support.   Standing balance support: Bilateral upper extremity supported Standing balance-Leahy Scale: Good Standing balance comment: Pt able to maintain standing balance with UE support on RW with min assist.  Pertinent Vitals/Pain Pain Assessment: 0-10 Pain Score: 8  Pain Location: R LE Pain Descriptors / Indicators: Aching;Throbbing Pain Intervention(s): Limited activity within patient's tolerance;Monitored during session;Premedicated before session    Home Living Family/patient expects to be discharged to:: Skilled nursing facility                       Prior Function Level of Independence: Independent with assistive device(s)         Comments: Pt recently was household ambulator, sleeping in recliner     Hand Dominance        Extremity/Trunk Assessment   Upper Extremity Assessment: Overall WFL for tasks assessed (B UE grossly 5/5)           Lower Extremity Assessment: Generalized weakness (B LE grossly 4/5)         Communication   Communication: No difficulties  Cognition Arousal/Alertness: Awake/alert Behavior During Therapy: WFL for tasks assessed/performed Overall Cognitive Status: Within Functional Limits for tasks assessed                      General Comments      Exercises Other Exercises Other Exercises: Seated ther ex: B ankle pumps, SLRs, quad sets x 10 reps with verbal and tactile cueing with supervision.       Assessment/Plan    PT Assessment Patient needs continued PT services  PT Diagnosis Difficulty walking;Generalized weakness;Acute pain   PT Problem List Decreased strength;Decreased range of motion;Decreased activity tolerance;Decreased balance;Decreased mobility;Decreased knowledge of use of DME  PT Treatment Interventions DME instruction;Gait training;Stair training;Functional mobility training;Therapeutic activities;Therapeutic exercise;Balance training;Patient/family education   PT Goals (Current goals can be found in the Care Plan section) Acute Rehab PT Goals Patient Stated Goal: To be able to return to SNF and continue to work on progress achieved.  PT Goal Formulation: With patient Time For Goal Achievement: 01/16/16 Potential to Achieve Goals: Good    Frequency Min 2X/week   Barriers to discharge        Co-evaluation               End of Session Equipment Utilized During Treatment: Gait belt Activity Tolerance: Patient limited by pain Patient left: in chair;with call bell/phone within reach;with chair alarm set Nurse Communication: Mobility status;Other  (comment) (Pt request for new linens)         Time: [id number] PT Time Calculation (min) (ACUTE ONLY): 30 min   Charges:         PT G Codes:        Georgina Pillion 01-05-16, 1:21 PM Georgina Pillion, SPT [phone]

## 2016-01-02 NOTE — Progress Notes (Signed)
Pharmacy Antibiotic Note  Jacqueline Boyd is a 75 y.o. female admitted on 01/01/2016 with possible bacteremia and tenosynovitis.  Pharmacy has been consulted for vancomycin dosing. Also currently ordered IV cipro.   Plan:  Vancomycin 1 gm IV X 1 given on 8/7 @ 19:00 Vancomycin 750 mg IV Q18H ordered to start 8/8 @ 0700, ~ 12 hrs after 1st dose (stacked dosing). Dose not given this AM so retimed for 1030 AM.  Vancomycin trough level 15-20 mcg/ml Trough before 4th dose 8/10 at 1600.   CrCl = 48 ml/min Ke = 0.044 hr-1 T1/2 = 15.8 hrs Vd = 35 L  Height: 5\' 8"  (172.7 cm) Weight: 110 lb (49.9 kg) IBW/kg (Calculated) : 63.9  Temp (24hrs), Avg:97.9 F (36.6 C), Min:97.5 F (36.4 C), Max:98.3 F (36.8 C)   Recent Labs Lab 01/01/16 1548 01/02/16 0454  WBC 5.6 4.7  CREATININE 0.74 0.74    Estimated Creatinine Clearance: 47.9 mL/min (by C-G formula based on SCr of 0.8 mg/dL).    Allergies  Allergen Reactions  . Codeine Other (See Comments)    Itching and "feels like worms crawling in body".  . Penicillins Rash    Has patient had a PCN reaction causing immediate rash, facial/tongue/throat swelling, SOB or lightheadedness with hypotension: No  Has patient had a PCN reaction causing severe rash involving mucus membranes or skin necrosis: No Has patient had a PCN reaction that required hospitalization: No   Has patient had a PCN reaction occurring within the last 10 years: No  If all of the above answers are "NO", then may proceed with Cephalosporin use.   . Prednisone Palpitations    Antimicrobials this admission: vanc 8/7 >>  Cipro 8/7 >>   Dose adjustments this admission:   Microbiology results: 8/7 BCx: sent  8/7 MRSA PCR: neg  Thank you for allowing pharmacy to be a part of this patient's care.  Rocky Morel 01/02/2016 10:06 AM

## 2016-01-02 NOTE — Clinical Social Work Note (Signed)
Clinical Social Work Assessment  Patient Details  Name: Jacqueline Boyd MRN: [id number] Date of Birth: Jul 07, 1940  Date of referral:  01/02/16               Reason for consult:  Other (Comment Required) (From [facility] )                Permission sought to share information with:  Chartered certified accountant granted to share information::  Yes, Verbal Permission Granted  Name::      [facility] [place]::     Relationship::     Contact Information:     Housing/Transportation Living arrangements for the past 2 months:  [place], [place] of Information:  Patient, Adult Children Patient Interpreter Needed:  None Criminal Activity/Legal Involvement Pertinent to Current Situation/Hospitalization:  No - Comment as needed Significant Relationships:  Adult Children Lives with:  Adult Children, Facility Resident Do you feel safe going back to the place where you live?  Yes Need for family participation in patient care:  Yes (Comment)  Care giving concerns:  Patient is a short term rehab resident at [facility].    Social Worker assessment / plan:  Holiday representative (CSW) reviewed chart and noted that patient is from [facility]. Patient [facility] to [facility] on 7/31/7. Per Jacqueline Boyd [facility] liaision patient can return when stable. CSW met with patient alone at bedside. Patient was alert and oriented and was sitting up in the chair. CSW introduced self and explained role of CSW department. Patient reported that she has been a [facility] for a week and the plan is to return there for short term rehab. CSW explained that patient's insurance [employer] will have to approve SNF stay again. Per patient she was living with her son Jacqueline Boyd and daughter in law in [place] before going to [facility]. Per patient she has her own "mother in law suite" in her son's home.   FL2 complete and faxed. Patient's [facility] is under [place]. Per patient her legal name is Jacqueline Boyd. [facility] staff member changed her name to reflect her legal hyphenated name. CSW contacted patient's son Jacqueline Boyd and made him aware of above. Son is agreeable for patient to return to [facility]. CSW sent [place] case manager a message making her aware of above. CSW will continue to follow and assist as needed.    Employment status:  Retired Nurse, adult PT Recommendations:  Not assessed at this time Information / Referral to community resources:  [place]  Patient/Family's Response to care:  Patient and her son are agreeable for patient to return to [facility].   Patient/Family's Understanding of and Emotional Response to Diagnosis, Current Treatment, and Prognosis:  Patient was pleasant and thanked CSW for visit. Patient reported that her goal is to get home and she has been to [facility] and [facility] for short term rehab recently.   Emotional Assessment Appearance:  Appears stated age Attitude/Demeanor/Rapport:    Affect (typically observed):  Accepting, Adaptable, Pleasant Orientation:  Oriented to Self, Oriented to Place, Oriented to  Time, Oriented to Situation Alcohol / Substance use:  Not Applicable Psych involvement (Current and /or in the community):  No (Comment)  Discharge Needs  Concerns to be addressed:  Discharge Planning Concerns Readmission within the last 30 days:  Yes Current discharge risk:  Dependent with Mobility Barriers to Discharge:  Continued Medical Work up   [employer], Jacqueline Beets, LCSW  01/02/2016, 11:47 AM

## 2016-01-03 LAB — GLUCOSE, CAPILLARY: Glucose-Capillary: 88 mg/dL (ref 65–99)

## 2016-01-03 MED ORDER — ENSURE ENLIVE PO LIQD
237.0000 mL | Freq: Three times a day (TID) | ORAL | Status: DC
  Administered 2016-01-03 – 2016-01-04 (×3): 237 mL via ORAL

## 2016-01-03 MED ORDER — BISACODYL 10 MG RE SUPP
10.0000 mg | Freq: Once | RECTAL | Status: AC
  Administered 2016-01-03: 10 mg via RECTAL
  Filled 2016-01-03: qty 1

## 2016-01-03 MED ORDER — DOXYCYCLINE HYCLATE 100 MG PO TABS
100.0000 mg | ORAL_TABLET | Freq: Two times a day (BID) | ORAL | Status: DC
  Administered 2016-01-03 – 2016-01-04 (×2): 100 mg via ORAL
  Filled 2016-01-03 (×2): qty 1

## 2016-01-03 MED ORDER — ALLOPURINOL 100 MG PO TABS
100.0000 mg | ORAL_TABLET | Freq: Every day | ORAL | Status: DC
  Administered 2016-01-04: 100 mg via ORAL
  Filled 2016-01-03: qty 1

## 2016-01-03 MED ORDER — LACTULOSE 10 GM/15ML PO SOLN
30.0000 g | Freq: Two times a day (BID) | ORAL | Status: DC
  Administered 2016-01-03 – 2016-01-04 (×3): 30 g via ORAL
  Filled 2016-01-03 (×3): qty 60

## 2016-01-03 MED ORDER — FLEET ENEMA 7-19 GM/118ML RE ENEM: 1.0000 | ENEMA | Freq: Every day | RECTAL | Status: DC | PRN

## 2016-01-03 NOTE — Progress Notes (Signed)
Pt states she feels like her body is shutting down. Pt is A&O.Pt was admitted to [facility] from [facility] 2 days ago. Stated she has not had a bowel movement in 10 days. Unable to verify that information at this time. Pt requests to see MD. Will notify Dr. Leslye Peer. IV zofran given for relief of nausea. Will continue to monitor.

## 2016-01-03 NOTE — Progress Notes (Signed)
Pt c/o nausea and general malaise. Refused breakfast, says she has no appetite. Denies pain.

## 2016-01-03 NOTE — Progress Notes (Signed)
[facility] PROGRESS NOTE  Jacqueline Boyd [id number] DOB: 09-24-1940 DOA: 01/01/2016 PCP: Marinda Elk, MD  HPI/Subjective: Patient seen this morning and was complaining that her body was shutting down and just did not want to move. She then stated that she hasn't had a bowel movement in 10 days and has abdominal pain and food feels like it just gets stuck.  Objective: Vitals:   01/03/16 0805 01/03/16 1503  BP: (!) 104/55 (!) 96/54  Pulse: 77 78  Resp: 16 18  Temp: 97.6 F (36.4 C)     Filed Weights   01/01/16 2029 01/02/16 0447 01/03/16 0445  Weight: 50 kg (110 lb 3.2 oz) 49.9 kg (110 lb) 50.7 kg (111 lb 11.2 oz)    ROS: Review of Systems  Constitutional: Negative for chills and fever.  Eyes: Negative for blurred vision.  Respiratory: Negative for cough and shortness of breath.   Cardiovascular: Negative for chest pain.  Gastrointestinal: Positive for abdominal pain, constipation and nausea. Negative for diarrhea and vomiting.  Genitourinary: Negative for dysuria.  Musculoskeletal: Positive for joint pain.  Neurological: Negative for dizziness and headaches.   Exam: Physical Exam  Constitutional: She is oriented to person, place, and time.  HENT:  Nose: No mucosal edema.  Mouth/Throat: No oropharyngeal exudate or posterior oropharyngeal edema.  Eyes: Conjunctivae, EOM and lids are normal. Pupils are equal, round, and reactive to light.  Neck: No JVD present. Carotid bruit is not present. No edema present. No thyroid mass and no thyromegaly present.  Cardiovascular: S1 normal and S2 normal.  Exam reveals no gallop.   No murmur heard. Pulses:      Dorsalis pedis pulses are 2+ on the right side, and 2+ on the left side.  Respiratory: No respiratory distress. She has no wheezes. She has no rhonchi. She has no rales.  GI: Soft. Bowel sounds are normal. There is generalized tenderness.  Musculoskeletal:       Right ankle: She exhibits swelling.       Left  ankle: She exhibits swelling.  Lymphadenopathy:    She has no cervical adenopathy.  Neurological: She is alert and oriented to person, place, and time. No cranial nerve deficit.  Skin: Skin is warm. Nails show no clubbing.  Right thumb covered. On her other fingers it looks like tophaceous gout. Left thigh extremity looks like skin tears. Right lower leg large ulceration that looks like it's healing and no signs of infection there.  Psychiatric: She has a normal mood and affect.      Data Reviewed: Basic Metabolic Panel:  Recent Labs Lab 01/01/16 1548 01/01/16 1842 01/02/16 0454  NA 134*  --  135  K 5.7* 5.8* 5.1  CL 95*  --  100*  CO2 29  --  28  GLUCOSE 100*  --  87  BUN 47*  --  41*  CREATININE 0.74  --  0.74  CALCIUM 8.8*  --  8.6*   CBC:  Recent Labs Lab 01/01/16 1548 01/02/16 0454  WBC 5.6 4.7  HGB 8.4* 7.8*  HCT 25.4* 23.9*  MCV 82.8 82.5  PLT 541* 519*   BNP (last 3 results)  Recent Labs  12/22/15 1154  BNP 85.0     Recent Results (from the past 240 hour(s))  MRSA PCR Screening     Status: None   Collection Time: 01/01/16  6:20 AM  Result Value Ref Range Status   MRSA by PCR NEGATIVE NEGATIVE Final    Comment:  The GeneXpert MRSA Assay (FDA approved for NASAL specimens only), is one component of a comprehensive MRSA colonization surveillance program. It is not intended to diagnose MRSA infection nor to guide or monitor treatment for MRSA infections.   Culture, blood (Routine X 2) w Reflex to ID Panel     Status: None (Preliminary result)   Collection Time: 01/01/16  3:48 PM  Result Value Ref Range Status   Specimen Description BLOOD LEFT WRIST  Final   Special Requests   Final    BOTTLES DRAWN AEROBIC AND ANAEROBIC AER 7ML ANA 9ML   Culture NO GROWTH 2 DAYS  Final   Report Status PENDING  Incomplete  Culture, blood (Routine X 2) w Reflex to ID Panel     Status: None (Preliminary result)   Collection Time: 01/01/16  3:48 PM   Result Value Ref Range Status   Specimen Description BLOOD LEFT AC  Final   Special Requests   Final    BOTTLES DRAWN AEROBIC AND ANAEROBIC AER 5ML ANA 7ML   Culture NO GROWTH 2 DAYS  Final   Report Status PENDING  Incomplete  Aerobic Culture (superficial specimen)     Status: None (Preliminary result)   Collection Time: 01/02/16  2:09 PM  Result Value Ref Range Status   Specimen Description FINGER  Final   Special Requests Normal  Final   Gram Stain   Final    RARE WBC PRESENT, PREDOMINANTLY PMN NO ORGANISMS SEEN    Culture   Final    NO GROWTH < 24 HOURS Performed at [facility]    Report Status PENDING  Incomplete     Scheduled Meds: . allopurinol  100 mg Oral Daily  . B-complex with vitamin C  1 tablet Oral Daily  . colchicine  0.6 mg Oral BID  . diclofenac  25 mg Oral BID  . docusate sodium  100 mg Oral BID  . doxycycline  100 mg Oral Q12H  . enoxaparin (LOVENOX) injection  40 mg Subcutaneous Q24H  . feeding supplement (ENSURE ENLIVE)  237 mL Oral TID BM  . feeding supplement (PRO-STAT SUGAR FREE 64)  30 mL Oral BID  . lactulose  30 g Oral BID  . mirtazapine  15 mg Oral QHS  . multivitamin with minerals  1 tablet Oral Daily    Assessment/Plan:  1. Tophaceous gout fingers. Culture sent by infectious disease doctor. Switched to doxycycline orally. Colchicine twice a day and allopurinol started for tomorrow. 2. Abdominal pain and constipation. Lactulose until bowel movement Fleet enema and suppository ordered 3. Chronic COPD. No signs of exacerbation at this point. 4. History of congestive heart failure. No signs of congestive heart failure at this point. 5. Nonhealing right lower extremity leg wound. Needs to follow closely until healing. 6. Weakness. Currently at [facility] 7. Hyperkalemia. This has improved. 8. Anemia. Add on ferritin. Guaiac stools.  Code Status:     Code Status Orders        Start     Ordered   01/01/16 1852  Full code   Continuous     01/01/16 1856    Code Status History    Date Active Date Inactive Code Status Order ID Comments User Context   12/22/2015  9:01 PM 12/25/2015  9:57 PM Full Code [id number]  Henreitta Leber, MD Inpatient   12/09/2015  5:59 PM 12/12/2015  9:17 PM Full Code [id number]  Lytle Butte, MD ED   11/12/2015  3:18 AM 11/13/2015  10:54 PM Full Code [id number]  Harrie Foreman, MD Inpatient     Disposition Plan: Back to [facility] Hopefully tomorrow  Consultants:  Infectious disease consultation  Rheumatology consultation  Antibiotics: - Doxycycline  Time spent: 24 minutes  [place], [facility]

## 2016-01-03 NOTE — Progress Notes (Signed)
MD at bedside. New orders placed.

## 2016-01-03 NOTE — Progress Notes (Signed)
[place] INFECTIOUS DISEASE PROGRESS NOTE Date of Admission:  01/01/2016     ID: Jacqueline Boyd is a 75 y.o. 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 B-complex with vitamin C  1 tablet Oral Daily  . colchicine  0.6 mg Oral BID  . diclofenac  25 mg Oral BID  . docusate sodium  100 mg Oral BID  . enoxaparin (LOVENOX) injection  40 mg Subcutaneous Q24H  . feeding supplement (ENSURE ENLIVE)  237 mL Oral TID BM  . feeding supplement (PRO-STAT SUGAR FREE 64)  30 mL Oral BID  . lactulose  30 g Oral BID  . mirtazapine  15 mg Oral QHS  . multivitamin with minerals  1 tablet Oral Daily  . vancomycin  750 mg Intravenous Q18H    Objective: Vital signs in last 24 hours: Temp:  [97.6 F (36.4 C)-98.1 F (36.7 C)] 97.6 F (36.4 C) (08/09 0805) Pulse Rate:  [75-98] 77 (08/09 0805) Resp:  [16-18] 16 (08/09 0805) BP: (101-132)/(55-64) 104/55 (08/09 0805) SpO2:  [92 %-98 %] 92 % (08/09 0805) Weight:  [50.7 kg (111 lb 11.2 oz)] 50.7 kg (111 lb 11.2 oz) (08/09 0445) Constitutional:  oriented to person, place, and time. Thin, chronically ill appearing HENT: [place]/AT, PERRLA, no scleral icterus Mouth/Throat: Oropharynx is clear and moist. No oropharyngeal exudate.  Cardiovascular: Normal rate, regular rhythm and normal heart sounds.Pulmonary/Chest: Effort normal and breath sounds normal. No respiratory distress.  has no  wheezes.  Neck = supple, no nuchal rigidity Abdominal: Soft. Bowel sounds are normal.  exhibits no distension. There is no tenderness.  Lymphadenopathy: no cervical adenopathy. No axillary adenopathy Neurological: alert and oriented to person, place, and time.  Skin: L thumb with swelling and open incision site with some ss drainage. Has obvious gout deposits at base of this wound and also on several other fingers and dorsum of hand bil le mult scabs   Lab Results  Recent Labs  01/01/16 1548 01/01/16 1842 01/02/16 0454  WBC 5.6  --  4.7  HGB 8.4*  --  7.8*  HCT 25.4*  --  23.9*  NA 134*  --  135  K 5.7* 5.8* 5.1  CL 95*  --  100*  CO2 29  --  28  BUN 47*  --  41*  CREATININE 0.74  --  0.74    Microbiology: Results for orders placed or performed during the hospital encounter of 01/01/16  MRSA PCR Screening     Status: None   Collection Time: 01/01/16  6:20 AM  Result Value Ref Range Status   MRSA by PCR NEGATIVE NEGATIVE Final    Comment:        The GeneXpert MRSA Assay (FDA approved for NASAL specimens only), is one component of a comprehensive MRSA colonization surveillance program. It is not intended  to diagnose MRSA infection nor to guide or monitor treatment for MRSA infections.   Culture, blood (Routine X 2) w Reflex to ID Panel     Status: None (Preliminary result)   Collection Time: 01/01/16  3:48 PM  Result Value Ref Range Status   Specimen Description BLOOD LEFT WRIST  Final   Special Requests   Final    BOTTLES DRAWN AEROBIC AND ANAEROBIC AER 7ML ANA 9ML   Culture NO GROWTH 2 DAYS  Final   Report Status PENDING  Incomplete  Culture, blood (Routine X 2) w Reflex to ID Panel     Status: None (Preliminary result)   Collection Time: 01/01/16  3:48 PM  Result Value Ref Range Status   Specimen Description BLOOD LEFT AC  Final   Special Requests   Final    BOTTLES DRAWN AEROBIC AND ANAEROBIC AER 5ML ANA 7ML   Culture NO GROWTH 2 DAYS  Final   Report Status  PENDING  Incomplete  Aerobic Culture (superficial specimen)     Status: None (Preliminary result)   Collection Time: 01/02/16  2:09 PM  Result Value Ref Range Status   Specimen Description FINGER  Final   Special Requests Normal  Final   Gram Stain   Final    RARE WBC PRESENT, PREDOMINANTLY PMN NO ORGANISMS SEEN    Culture   Final    NO GROWTH < 24 HOURS Performed at [facility]    Report Status PENDING  Incomplete    Studies/Results: Dg Hand 2 View Right  Result Date: 01/02/2016 CLINICAL DATA:  Evaluation of chronic left and right hand pain. Patient has several small raised areas of concerns on multiple areas on both hands and her right thumb has a swollen and pus filled area that had to be lanced post admittance. Patient was unable to remove ring on right hand images and right thumb was bandaged during imaging. EXAM: RIGHT HAND - 2 VIEW COMPARISON:  None. FINDINGS: No acute fracture. There arthropathic changes involving multiple joints. At the PIP joint of the ring finger, there are multiple marginal erosions. There is asymmetric joint space narrowing. Low There is anterior subluxation of the proximal phalanx of the index finger in relation to the second metacarpal head. Marginal erosions are noted along the ulnar and volar aspect of the second metacarpal head. There is a probable erosion at the base of the proximal phalanx of the index finger. Marginal erosions are noted at the base of the proximal phalanx of thumb. There is joint space narrowing, subchondral sclerosis and small marginal osteophytes at the trapezium first metacarpal articulation. Bones are demineralized. Soft tissue swelling is evident adjacent to multiple joints, most prominently the PIP joint of the fourth finger. There is soft tissue swelling over the anterior margin of the distal thumb. No radiopaque foreign body. No soft tissue ossification or calcification. IMPRESSION: 1. No fracture or dislocation. 2. 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 The pattern of marginal erosions raises suspicion for gout. The second metacarpophalangeal joint shows anterior subluxation as well as erosions. 3. Soft tissue swelling is seen associated with multiple joints. There is also soft tissue swelling over the anterior aspect of thumb. 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 Electronically Signed   By: Lajean Manes M.D.   On: 01/02/2016 17:26   Dg Hand 2 View Left  Result Date: 01/02/2016 CLINICAL DATA:  Evaluation of chronic left and right hand pain. Patient has several small raised areas of concerns  on multiple areas on both hands and her right thumb has a swollen and pus filled area that had to be lanced post admittance. Patient was unable to remove ring on right hand images and right thumb was bandaged during imaging. EXAM: LEFT HAND - 2 VIEW COMPARISON:  Right hand radiographs FINDINGS: No fracture or dislocation. Next item there arthropathic changes involving multiple joints, most prominently the PIP joint of the fourth finger. There is asymmetric joint space narrowing with subchondral a marginal erosions. There is a subtle marginal erosion along the radial margin of the proximal phalanx at the PIP joint of the index finger. The joint space widening with subchondral cystic change and mild sclerosis at the trapezium first metacarpal articulation. Soft tissue swelling is noted at the PIP joints of the index and ring fingers. There is also soft tissue swelling adjacent to the DIP joint of the index finger and along the volar aspect of the middle finger between the PIP and DIP joints. No soft tissue ossification or calcification. IMPRESSION: 1. No acute fracture or dislocation. 2. Arthropathic changes as described consistent with an erosive arthropathy. The changes are more severe involving the right hand. Consider gout in the differential diagnosis. Electronically Signed   By: Lajean Manes M.D.   On:  01/02/2016 17:29    Assessment/Plan: Jacqueline Boyd is a 75 y.o. 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  Not septic, no pus but mild ss drainage has been cultured. Cx neg so far She has LE ulcers and scabs she says are from some kind of tape used during surgery Recommendations Dc vanco  Start doxy 100 bid for 7 days  Cont gout treatment with colchine and allopurinol  Thank you very much for the consult. Will follow with you.  Janica Eldred P   01/03/2016, 1:52 PM

## 2016-01-03 NOTE — Progress Notes (Signed)
Pt feeling better by end of shift this evening. Had lg BM of very hard stool this afternoon. Dressing changed this AM, minimal drainage from incision, mild pain reported. Pt refused lunch and breakfast but drank ensure between meals. MD updated. Pt resting with eyes closed in chair, complains that bed and chair with pillows are not comfortable. Will continue to monitor. Plan to D/c tomorrow per MD.

## 2016-01-03 NOTE — Progress Notes (Addendum)
Initial Nutrition Assessment  DOCUMENTATION CODES:   Severe malnutrition in context of chronic illness, Underweight  INTERVENTION:   -Ensure Enlive po TID, each supplement provides 350 kcal and 20 grams of protein -Continue 30 ml Prostat BID, each supplement provides 100 kcals and 15 grams protein -Continue MVI daily  NUTRITION DIAGNOSIS:   Malnutrition related to chronic illness as evidenced by severe depletion of body fat, severe depletion of muscle mass.  GOAL:   Patient will meet greater than or equal to 90% of their needs  MONITOR:   PO intake, Supplement acceptance, Labs, Weight trends, Skin, I & O's  REASON FOR ASSESSMENT:   Malnutrition Screening Tool    ASSESSMENT:   Jacqueline Boyd is a 75 y.o. 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  Not septic, no pus but mild ss drainage has been cultured.  Pt admitted with multiple skin nodules on both hands concerning for possible contiguous infected tenosynovitis/bacteremia.   Spoke with pt at bedside. She reports her appetite has been poor, but "I've been working on my protein intake because I'm severely malnourished". She reports that she did not eat her breakfast this morning secondary to poor appetite (meal tray on bedside table untouched). PTA she confirms she was taking an MVI and Ensure supplement TID to assist with optimal intake and wound healing.   Pt shares that her weight has been stable. Noted wt gain trend over the past month.   CWOCN note reviewed from 01/02/16; pt with MARSI on lower extremities and stage II pressure injury on sacrum.   Nutrition-Focused physical exam completed. Findings are severe fat depletion, severe muscle depletion, and no edema.   Medications reviewed and include MVI and vitamin C.   Case discussed with RN.   Labs reviewed.   Diet Order:  Diet regular Room service appropriate? Yes; Fluid consistency: Thin  Skin:  Wound (see comment) (st II sacrum,  MARSI lower extremities)  Last BM:  12/29/15  Height:   Ht Readings from Last 1 Encounters:  01/01/16 5\' 8"  (1.727 m)    Weight:   Wt Readings from Last 1 Encounters:  01/03/16 111 lb 11.2 oz (50.7 kg)    Ideal Body Weight:  63.6 kg  BMI:  Body mass index is 16.98 kg/m.  Estimated Nutritional Needs:   Kcal:  1600-1800  Protein:  75-90 grams  Fluid:  1.6-1.8 L  EDUCATION NEEDS:   Education needs addressed  Alando Colleran A. Jimmye Norman, RD, LDN, CDE Pager: [phone] After hours Pager: [phone]

## 2016-01-03 NOTE — Progress Notes (Signed)
Physical Therapy Treatment Patient Details Name: Jacqueline Boyd MRN: [id number] DOB: [birth year] Today's Date: 01/03/2016    History of Present Illness Pt is a 75 y/o female with infectious tenosynovitis of wrist extensor. 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 Per pt notes, pt has had R thumb pain for last 3 days with ulcers on R LE and sacrum. PMH includes COPD, CHF, OA, and HTN. Pt at [facility] prior to hospital stay.    PT Comments    Pt in bed ready for session despite report of feeling badly today.  She was able to get to edge of bed with min a x 1 and HOB raised.  She then stood with min a x 1 and was able to ambulate 70' x 1 with generally steady gait.  She returned to room and completed seated exercises as described below.  She was then able to ambulate an additional 45' x 1 with walker and min a x 1 limited by general fatigue.  Upon return she requested to sit on bedside commode due to suppository.    Pt with improving mobility skills.  Upon discussion with pt she stated she felt like she would be able to return home instead of going back to SNF.  She voiced financial concerns regarding several hospitalizations since hip surgery.  Will see how pt does tomorrow and if she continues to do well with mobility may change recommendation to  HHPT.    Follow Up Recommendations  SNF     Equipment Recommendations  None recommended by PT    Recommendations for Other Services       Precautions / Restrictions Precautions Precautions: Fall Restrictions Weight Bearing Restrictions: No    Mobility  Bed Mobility Overal bed mobility: Needs Assistance Bed Mobility: Supine to Sit     Supine to sit: Min assist;HOB elevated        Transfers Overall transfer level: Needs assistance Equipment used: Rolling walker (2 wheeled) Transfers: Sit to/from Stand Sit to Stand: Min assist            Ambulation/Gait Ambulation/Gait assistance: Min guard Ambulation Distance (Feet): 70 Feet (+45) Assistive device:  Rolling walker (2 wheeled) Gait Pattern/deviations: Step-through pattern;Decreased step length - right;Decreased step length - left   Gait velocity interpretation: Below normal speed for age/gender General Gait Details: PT with generally steady gait without loss of balance.  Demonstrates good safety despite slow gait.   Stairs            Wheelchair Mobility    Modified Rankin (Stroke Patients Only)       Balance Overall balance assessment: Modified Independent Sitting-balance support: Feet supported Sitting balance-Leahy Scale: Good     Standing balance support: Bilateral upper extremity supported Standing balance-Leahy Scale: Good                      Cognition Arousal/Alertness: Awake/alert Behavior During Therapy: WFL for tasks assessed/performed Overall Cognitive Status: Within Functional Limits for tasks assessed                      Exercises Other Exercises Other Exercises: Pt was able to recall seated HEP and do without verbal cues for toe taps, LAQ, AB/Adduction, and marches in sitting x 20 BLE    General Comments        Pertinent Vitals/Pain Pain Assessment: 0-10 Pain Score: 5  Pain Location: R LE Pain Descriptors / Indicators: Aching Pain Intervention(s): Limited activity within patient's tolerance  Home Living                      Prior Function            PT Goals (current goals can now be found in the care plan section) Acute Rehab PT Goals Patient Stated Goal: to go home Progress towards PT goals: Progressing toward goals    Frequency  Min 2X/week    PT Plan Current plan remains appropriate    Co-evaluation             End of Session Equipment Utilized During Treatment: Gait belt Activity Tolerance: Patient limited by fatigue Patient left: Other (comment);with call bell/phone within reach (on Commode)     Time: [id number] PT Time Calculation (min) (ACUTE ONLY): 27 min  Charges:  $Gait  Training: 8-22 mins $Therapeutic Exercise: 8-22 mins                    G Codes:      Chesley Noon Jan 16, 2016, 3:08 PM

## 2016-01-04 LAB — BASIC METABOLIC PANEL
Anion gap: 8 (ref 5–15)
BUN: 38 mg/dL — AB (ref 6–20)
CALCIUM: 8.8 mg/dL — AB (ref 8.9–10.3)
CO2: 28 mmol/L (ref 22–32)
CREATININE: 0.88 mg/dL (ref 0.44–1.00)
Chloride: 100 mmol/L — ABNORMAL LOW (ref 101–111)
GFR calc Af Amer: >60 mL/min (ref >60)
GLUCOSE: 96 mg/dL (ref 65–99)
Potassium: 4.8 mmol/L (ref 3.5–5.1)
Sodium: 136 mmol/L (ref 135–145)

## 2016-01-04 LAB — AEROBIC CULTURE W GRAM STAIN (SUPERFICIAL SPECIMEN)
Culture: NO GROWTH
Special Requests: NORMAL

## 2016-01-04 LAB — AEROBIC CULTURE  (SUPERFICIAL SPECIMEN)

## 2016-01-04 MED ORDER — ALPRAZOLAM 0.25 MG PO TABS: 0.2500 mg | ORAL_TABLET | Freq: Three times a day (TID) | ORAL | 0 refills | Status: AC | PRN

## 2016-01-04 MED ORDER — FUROSEMIDE 20 MG PO TABS: 20.0000 mg | ORAL_TABLET | Freq: Every day | ORAL | 0 refills | Status: AC | PRN

## 2016-01-04 MED ORDER — MIRTAZAPINE 15 MG PO TABS: 15.0000 mg | ORAL_TABLET | Freq: Every day | ORAL | 0 refills | Status: AC

## 2016-01-04 MED ORDER — ALLOPURINOL 100 MG PO TABS: 100.0000 mg | ORAL_TABLET | Freq: Every day | ORAL | 0 refills | Status: AC

## 2016-01-04 MED ORDER — ENSURE ENLIVE PO LIQD: 237.0000 mL | Freq: Three times a day (TID) | ORAL | 0 refills | Status: AC

## 2016-01-04 MED ORDER — DOXYCYCLINE HYCLATE 100 MG PO TABS: 100.0000 mg | ORAL_TABLET | Freq: Two times a day (BID) | ORAL | 8 refills | Status: AC

## 2016-01-04 MED ORDER — COLCHICINE 0.6 MG PO TABS: 0.6000 mg | ORAL_TABLET | Freq: Two times a day (BID) | ORAL | 0 refills | Status: AC

## 2016-01-04 MED ORDER — CYCLOBENZAPRINE HCL 5 MG PO TABS: 5.0000 mg | ORAL_TABLET | Freq: Three times a day (TID) | ORAL | 0 refills | Status: AC | PRN

## 2016-01-04 MED ORDER — SODIUM CHLORIDE 0.9 % IV SOLN
400.0000 mg | Freq: Once | INTRAVENOUS | Status: AC
  Administered 2016-01-04: 400 mg via INTRAVENOUS
  Filled 2016-01-04: qty 20

## 2016-01-04 MED ORDER — POLYETHYLENE GLYCOL 3350 17 G PO PACK: 17.0000 g | PACK | Freq: Every day | ORAL | 0 refills | Status: AC

## 2016-01-04 MED ORDER — OXYCODONE HCL 5 MG PO TABS: 5.0000 mg | ORAL_TABLET | Freq: Four times a day (QID) | ORAL | 0 refills | Status: AC | PRN

## 2016-01-04 MED ORDER — POLYETHYLENE GLYCOL 3350 17 G PO PACK: 17.0000 g | PACK | Freq: Every day | ORAL | Status: DC

## 2016-01-04 NOTE — Progress Notes (Signed)
Pt discharged to home with family. Son/daughter transported by car. Medications reviewed with pt, d/c instructions.

## 2016-01-04 NOTE — Care Management Note (Signed)
Case Management Note  Patient Details  Name: Jacqueline Boyd MRN: [id number] Date of Birth: [birth year]  Subjective/Objective:   Spoke with patient for discharge planning , patient is from home with Son and Daughter and lives in mother in [place]. She is alert and oriented. Walker with a walker and stated that she feels much better and does not want to go to SNF. Patient was open to [place] prior and would like to continue with this provider. Referral placed with Corene Cornea at [facility].  Pateint has walker, BSC and shower chair. Will need RN PT at discharge. No other CM needs identified.                 Action/Plan: Home with Home Health.   Expected Discharge Date:  01/03/16               Expected Discharge Plan:  [place]  In-House Referral:     Discharge planning Services  CM Consult  Post Acute Care Choice:    Choice offered to:  Patient  DME Arranged:  N/A DME Agency:  [place] Arranged:  RN, PT [facility] Agency:  [place]  Status of Service:  Completed, signed off  If discussed at [place] of Stay Meetings, dates discussed:    Additional Comments:  Alvie Heidelberg, RN 01/04/2016, 9:59 AM

## 2016-01-04 NOTE — Progress Notes (Signed)
Patient is now discharging home with home health. RN Case Manager aware of above. Please reconsult if future social work needs arise. CSW signing off.   [employer], LCSW [phone]

## 2016-01-04 NOTE — Progress Notes (Signed)
Pt complains of nausea, dizziness, mild cramping in abdomen. Non-tender with palpation, audible gurgles. Had several large BM's yesterday and overnight after suppository and lactulose. Multiple non-health related complaints about pillows, lights, etc this am. Pt requesting xanax and muscle relaxer, advised she can take them separately to avoid further dizziness. Pt requests muscle relaxer. Will continue to monitor, MD rounding on unit.

## 2016-01-04 NOTE — Progress Notes (Signed)
Physical Therapy Treatment Patient Details Name: Jacqueline Boyd MRN: [id number] DOB: 1941-02-16 Today's Date: 01/04/2016    History of Present Illness Pt is a 75 y/o female with infectious tenosynovitis of wrist extensor. 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 Per pt notes, pt has had R thumb pain for last 3 days with ulcers on R LE and sacrum. PMH includes COPD, CHF, OA, and HTN. Pt at [facility] prior to hospital stay.    PT Comments    Pt in bed upon PT arrival and reports pain in abdomen. Pt requires min guard for bed mobility, transfers, and ambulation and min-mod assist with stairs. Pt primarily limited due to fatigue, reports she does not want to be too tired before discharge. Pt able to perform bed mobility with less pain today and requires minimal cueing for safe UE placement throughout session. Pt able to ambulate 60 ft with RW with a slow, step-to gait pattern. Attempted reciprocal gait, but pt reports she is unable to due to "R leg is longer than my L leg." Pt able to ascend/descend 4 stairs with R rail and SPC with verbal and tactile cueing for safe technique. Pt requires min assist with placing R LE on step when ascending, reports her son is able to help with that at home. Pt demonstrates slight unsteadiness and requires mod assist with descending stairs, but no LOB noted and no assist for R LE needed. Pt is progressing towards her goals and will continue to benefit from skilled PT services in order to improve strength, balance, functional mobility, and return to PLOF. At this time, PT is changing recommendation to HHPT due to increased independence and safety with functional mobility. CSW and RN notified of change in recommendation.   Follow Up Recommendations  Home health PT     Equipment Recommendations  None recommended by PT    Recommendations for Other Services       Precautions / Restrictions Precautions Precautions: Fall Restrictions Weight Bearing Restrictions: No    Mobility  Bed  Mobility Overal bed mobility: Needs Assistance Bed Mobility: Supine to Sit     Supine to sit: Min guard     General bed mobility comments: Pt requires minimal to no verbal cueing to scoot to EOB, min guard with bed mobility.   Transfers Overall transfer level: Needs assistance Equipment used: Rolling walker (2 wheeled) Transfers: Sit to/from Stand Sit to Stand: Min guard         General transfer comment: Minimal verbal cueing for proper hand placement with sit/stand transfer. No LOB or unsteadiness noted upon standing.   Ambulation/Gait Ambulation/Gait assistance: Min guard Ambulation Distance (Feet): 60 Feet Assistive device: Rolling walker (2 wheeled) Gait Pattern/deviations: Step-to pattern;Decreased step length - right;Decreased step length - left;Decreased stride length;Antalgic;Trunk flexed Gait velocity: 20 ft/sec Gait velocity interpretation: Below normal speed for age/gender General Gait Details: Pt demonstrates steady, step-to gait pattern. Attempted reciprocal gait, per pt "R leg is longer than the L so I have a limp" and was unable to demonstrate reciprocal gait.   Stairs Stairs: Yes Stairs assistance: Mod assist Stair Management: One rail Right;Step to pattern;With cane [facility]) Number of Stairs: 4 General stair comments: Pt able to ascend/descend 4 steps with R rail using SPC. Pt requires min assist to place R LE on stair when ascending (pt reports her son does this for her at home). Pt demonstrates slight unteadiness descending stairs, requiring mod assist from PT. Verbal and tactile cueing for safe technique.  Wheelchair Mobility  Modified Rankin (Stroke Patients Only)       Balance Overall balance assessment: Modified Independent Sitting-balance support: Feet supported Sitting balance-Leahy Scale: Normal     Standing balance support: Bilateral upper extremity supported Standing balance-Leahy Scale: Good                      Cognition  Arousal/Alertness: Awake/alert Behavior During Therapy: WFL for tasks assessed/performed Overall Cognitive Status: Within Functional Limits for tasks assessed                      Exercises Other Exercises Other Exercises: Seated ther ex: B marches, LAQs, heel raises, and quad sets x 10 reps with supervision.  Other Exercises: Pt taken to [facility] to void. Pt is able to transfer from sit/stand with min assist. No verbal cueing needed for safe technique.     General Comments        Pertinent Vitals/Pain Pain Assessment: Faces Faces Pain Scale: Hurts little more Pain Location: Abdomen Pain Descriptors / Indicators: Cramping Pain Intervention(s): Limited activity within patient's tolerance;Monitored during session    Home Living                      Prior Function            PT Goals (current goals can now be found in the care plan section) Acute Rehab PT Goals Patient Stated Goal: to go home PT Goal Formulation: With patient Time For Goal Achievement: 01/16/16 Potential to Achieve Goals: Good Progress towards PT goals: Progressing toward goals    Frequency  Min 2X/week    PT Plan Discharge plan needs to be updated    Co-evaluation             End of Session Equipment Utilized During Treatment: Gait belt Activity Tolerance: Patient limited by fatigue Patient left: in chair;with call bell/phone within reach     Time: 1030-1109 PT Time Calculation (min) (ACUTE ONLY): 39 min  Charges:                       G Codes:      Georgina Pillion 01-06-16, 11:28 AM  Georgina Pillion, SPT [phone]

## 2016-01-04 NOTE — Discharge Summary (Signed)
[facility] [place] NAME: Jacqueline Boyd    MR#:  [id number]  DATE OF BIRTH:  06-24-1940  DATE OF ADMISSION:  01/01/2016 ADMITTING PHYSICIAN: Epifanio Lesches, MD  DATE OF DISCHARGE: 01/04/2016  PRIMARY CARE PHYSICIAN: Marinda Elk, MD    ADMISSION DIAGNOSIS:  Hyperkalemia [E87.5] Felon, right [L02.511]  DISCHARGE DIAGNOSIS:  Active Problems:   Infectious tenosynovitis of wrist extensor   SECONDARY DIAGNOSIS:   Past Medical History:  Diagnosis Date  . CHF (congestive heart failure) ([place])   . COPD with emphysema ([place])   . Hypertension     HOSPITAL COURSE:   1. Tophaceous gout. I gave 1 dose of Solu-Medrol. Colchicine ordered. Low-dose allopurinol ordered. Patient was seen in consultation by rheumatology Dr. Duard Brady connote all. Patient can follow up with him as outpatient. 2. Possible infection right thumb. Dr. Ola Spurr infectious disease did a culture and nothing was growing. Patient was started on vancomycin and switch over to by mouth doxycycline for a few more days. 3. Chronic wounds of the lower extremity. Keep covered with nonstick dressing. Advise follow-up at the wound care center. Home health set up. 4. Weakness. Physical therapy recommended rehabilitation. Patient states that she's going home. Home health set up. 5. Hyperkalemia on admission. This was treated and improved during the hospital course. 6. Iron deficiency anemia. I did give IV venofer while here. I did not give iron orally because of her constipation. Follow-up as outpatient. Consider GI follow-up as outpatient. 7. Severe constipation. I did give her lactulose enema and suppository with good response. MiraLAX prescribed on a daily basis. 8. Patient with history of CHF no signs of CHF on this hospital course. Patient only takes when necessary Lasix at home. 9. COPD. Respiratory status stable.  DISCHARGE CONDITIONS:   Satisfactory  CONSULTS OBTAINED:   Treatment Team:  Leonel Ramsay, MD Emmaline Kluver., MD  DRUG ALLERGIES:   Allergies  Allergen Reactions  . Codeine Other (See Comments)    Itching and "feels like worms crawling in body".  . Penicillins Rash    Has patient had a PCN reaction causing immediate rash, facial/tongue/throat swelling, SOB or lightheadedness with hypotension: No  Has patient had a PCN reaction causing severe rash involving mucus membranes or skin necrosis: No Has patient had a PCN reaction that required hospitalization: No   Has patient had a PCN reaction occurring within the last 10 years: No  If all of the above answers are "NO", then may proceed with Cephalosporin use.   . Prednisone Palpitations    DISCHARGE MEDICATIONS:   Current Discharge Medication List    START taking these medications   Details  allopurinol (ZYLOPRIM) 100 MG tablet Take 1 tablet (100 mg total) by mouth daily. Qty: 30 tablet, Refills: 0    colchicine 0.6 MG tablet Take 1 tablet (0.6 mg total) by mouth 2 (two) times daily. Qty: 60 tablet, Refills: 0    doxycycline (VIBRA-TABS) 100 MG tablet Take 1 tablet (100 mg total) by mouth every 12 (twelve) hours. Qty: 8 tablet, Refills: 8    feeding supplement, ENSURE ENLIVE, (ENSURE ENLIVE) LIQD Take 237 mLs by mouth 3 (three) times daily between meals. Qty: 90 Bottle, Refills: 0    furosemide (LASIX) 20 MG tablet Take 1 tablet (20 mg total) by mouth daily as needed for fluid. Qty: 30 tablet, Refills: 0    polyethylene glycol (MIRALAX / GLYCOLAX) packet Take 17 g by mouth daily. Qty: 30  each, Refills: 0      CONTINUE these medications which have CHANGED   Details  ALPRAZolam (XANAX) 0.25 MG tablet Take 1 tablet (0.25 mg total) by mouth 3 (three) times daily as needed for anxiety. Qty: 15 tablet, Refills: 0    cyclobenzaprine (FLEXERIL) 5 MG tablet Take 1 tablet (5 mg total) by mouth 3 (three) times daily as needed for muscle spasms. Qty: 90 tablet, Refills: 0     mirtazapine (REMERON) 15 MG tablet Take 1 tablet (15 mg total) by mouth at bedtime. Qty: 30 tablet, Refills: 0    oxyCODONE (OXY IR/ROXICODONE) 5 MG immediate release tablet Take 1 tablet (5 mg total) by mouth every 6 (six) hours as needed for moderate pain or breakthrough pain. Qty: 15 tablet, Refills: 0      CONTINUE these medications which have NOT CHANGED   Details  acetaminophen (TYLENOL) 500 MG tablet Take 1,000 mg by mouth every 8 (eight) hours as needed.    albuterol (PROVENTIL HFA;VENTOLIN HFA) 108 (90 Base) MCG/ACT inhaler Inhale 1-2 puffs into the lungs every 6 (six) hours as needed for wheezing or shortness of breath.    Multiple Vitamin (MULTIVITAMIN WITH MINERALS) TABS tablet Take 1 tablet by mouth daily.    B Complex-C (B-COMPLEX WITH VITAMIN C) tablet Take 1 tablet by mouth daily.    magnesium hydroxide (MILK OF MAGNESIA) 400 MG/5ML suspension Take 30 mLs by mouth every 4 (four) hours as needed for mild constipation.      STOP taking these medications     aspirin EC 325 MG tablet      Amino Acids-Protein Hydrolys (FEEDING SUPPLEMENT, PRO-STAT SUGAR FREE 64,) LIQD      docusate sodium (COLACE) 100 MG capsule      ondansetron (ZOFRAN) 4 MG tablet          DISCHARGE INSTRUCTIONS:   Follow-up PMD one week  If you experience worsening of your admission symptoms, develop shortness of breath, life threatening emergency, suicidal or homicidal thoughts you must seek medical attention immediately by calling 911 or calling your MD immediately  if symptoms less severe.  You Must read complete instructions/literature along with all the possible adverse reactions/side effects for all the Medicines you take and that have been prescribed to you. Take any new Medicines after you have completely understood and accept all the possible adverse reactions/side effects.   Please note  You were cared for by a hospitalist during your hospital stay. If you have any questions about  your discharge medications or the care you received while you were in the hospital after you are discharged, you can call the unit and asked to speak with the hospitalist on call if the hospitalist that took care of you is not available. Once you are discharged, your primary care physician will handle any further medical issues. Please note that NO REFILLS for any discharge medications will be authorized once you are discharged, as it is imperative that you return to your primary care physician (or establish a relationship with a primary care physician if you do not have one) for your aftercare needs so that they can reassess your need for medications and monitor your lab values.    Today   CHIEF COMPLAINT:   Chief Complaint  Patient presents with  . Other    HISTORY OF PRESENT ILLNESS:  Jacqueline Boyd  is a 75 y.o. female with a known history of Sent in for pain on her fingers   VITAL SIGNS:  Blood pressure 114/63, pulse 85, temperature 98 F (36.7 C), temperature source Oral, resp. rate 18, height 5\' 8"  (1.727 m), weight 50.7 kg (111 lb 11.2 oz), SpO2 96 %.    PHYSICAL EXAMINATION:  GENERAL:  75 y.o.-year-old patient lying in the bed with no acute distress.  EYES: Pupils equal, round, reactive to light and accommodation. No scleral icterus. Extraocular muscles intact.  HEENT: Head atraumatic, normocephalic. Oropharynx and nasopharynx clear.  NECK:  Supple, no jugular venous distention. No thyroid enlargement, no tenderness.  LUNGS: Decreased breath sounds bilaterally, no wheezing, rales,rhonchi or crepitation. No use of accessory muscles of respiration.  CARDIOVASCULAR: S1, S2 normal. No murmurs, rubs, or gallops.  ABDOMEN: Soft, non-tender, non-distended. Bowel sounds present. No organomegaly or mass.  EXTREMITIES: No pedal edema, cyanosis, or clubbing.  NEUROLOGIC: Cranial nerves II through XII are intact. Muscle strength 5/5 in all extremities. Sensation intact. Gait not checked.   PSYCHIATRIC: The patient is alert and oriented x 3.  SKIN: Thumb lesion opened up with slight erythema around the whitish lesions. She does have whitish lesions on other fingers. Chronic right lower extremity wound. Skin tears on her left thigh.  DATA REVIEW:   CBC  Recent Labs Lab 01/02/16 0454  WBC 4.7  HGB 7.8*  HCT 23.9*  PLT 519*    Chemistries   Recent Labs Lab 01/04/16 0811  NA 136  K 4.8  CL 100*  CO2 28  GLUCOSE 96  BUN 38*  CREATININE 0.88  CALCIUM 8.8*     Microbiology Results  Results for orders placed or performed during the hospital encounter of 01/01/16  MRSA PCR Screening     Status: None   Collection Time: 01/01/16  6:20 AM  Result Value Ref Range Status   MRSA by PCR NEGATIVE NEGATIVE Final    Comment:        The GeneXpert MRSA Assay (FDA approved for NASAL specimens only), is one component of a comprehensive MRSA colonization surveillance program. It is not intended to diagnose MRSA infection nor to guide or monitor treatment for MRSA infections.   Culture, blood (Routine X 2) w Reflex to ID Panel     Status: None (Preliminary result)   Collection Time: 01/01/16  3:48 PM  Result Value Ref Range Status   Specimen Description BLOOD LEFT WRIST  Final   Special Requests   Final    BOTTLES DRAWN AEROBIC AND ANAEROBIC AER 7ML ANA 9ML   Culture NO GROWTH 3 DAYS  Final   Report Status PENDING  Incomplete  Culture, blood (Routine X 2) w Reflex to ID Panel     Status: None (Preliminary result)   Collection Time: 01/01/16  3:48 PM  Result Value Ref Range Status   Specimen Description BLOOD LEFT AC  Final   Special Requests   Final    BOTTLES DRAWN AEROBIC AND ANAEROBIC AER 5ML ANA 7ML   Culture NO GROWTH 3 DAYS  Final   Report Status PENDING  Incomplete  Aerobic Culture (superficial specimen)     Status: None   Collection Time: 01/02/16  2:09 PM  Result Value Ref Range Status   Specimen Description FINGER  Final   Special Requests Normal   Final   Gram Stain   Final    RARE WBC PRESENT, PREDOMINANTLY PMN NO ORGANISMS SEEN    Culture   Final    NO GROWTH 2 DAYS Performed at [facility]    Report Status 01/04/2016 FINAL  Final  Management plans discussed with the patient, And she is in agreement to go home and not rehabilitation..  CODE STATUS:     Code Status Orders        Start     Ordered   01/01/16 1852  Full code  Continuous     01/01/16 1856    Code Status History    Date Active Date Inactive Code Status Order ID Comments User Context   12/22/2015  9:01 PM 12/25/2015  9:57 PM Full Code [id number]  Henreitta Leber, MD Inpatient   12/09/2015  5:59 PM 12/12/2015  9:17 PM Full Code [id number]  Lytle Butte, MD ED   11/12/2015  3:18 AM 11/13/2015 10:54 PM Full Code [id number]  Harrie Foreman, MD Inpatient      TOTAL TIME TAKING CARE OF THIS PATIENT: 35 minutes.    Loletha Grayer M.D on 01/04/2016 at 6:06 PM  Between 7am to 6pm - Pager - [phone]  After 6pm go to [email or web address] - password [email or web address]  [employer] Office  [phone]  CC: Primary care physician; Marinda Elk, MD

## 2016-01-04 NOTE — Progress Notes (Signed)
New referral for Home Palliative Medicine Consult received from [place]. Patient information faxed to [facility] referral. Thank you Flo Shanks RN, BSN, [facility] and [facility] [place], hospital Liaison [phone]

## 2016-01-05 ENCOUNTER — Encounter: Payer: Self-pay | Admitting: *Deleted

## 2016-01-05 ENCOUNTER — Other Ambulatory Visit: Payer: Self-pay | Admitting: *Deleted

## 2016-01-05 DIAGNOSIS — Z9889 Other specified postprocedural states: Secondary | ICD-10-CM

## 2016-01-05 DIAGNOSIS — I5032 Chronic diastolic (congestive) heart failure: Secondary | ICD-10-CM

## 2016-01-05 DIAGNOSIS — Z748 Other problems related to care provider dependency: Secondary | ICD-10-CM

## 2016-01-05 DIAGNOSIS — I10 Essential (primary) hypertension: Secondary | ICD-10-CM

## 2016-01-05 DIAGNOSIS — J441 Chronic obstructive pulmonary disease with (acute) exacerbation: Secondary | ICD-10-CM

## 2016-01-05 NOTE — Patient Outreach (Addendum)
[facility]) Care Management  [place]  01/05/2016   Jacqueline Boyd [birth year] [id number]  Initial transition of care call placed. Subjective:  RNCM placed call to patient as part of the transition of care program,Patient answered questions  HIPAA information  Verified. Jacqueline Boyd reports being tired today, "I haven't even been home 24 hours yet" Patient states she rested well on last night, denies shortness of breath , increased cough or swelling.  Patient states she lives in mother in law suite at her son's home, and daughter in law helps with organizing her medications.   Patient has PCP visit on next week , 8/17 and her son will be able to provide transportation , patient asked if we are available to help facilitate transportation to her orthopedic appointment on 8/18 in [place], states she cannot afford to pay. [facility] home [facility] RN has visited on today, and provided wound care, to lower legs, patient states nurse called MD for instructions , therefore she states she will not follow up with wound clinic yet.  Jacqueline Boyd states she has received the [employer] meals in the past and is not interested at this time. Patient discussed her weight being 110, and she has been trying to eat more and increase her protein. Patient was recently discharged from hospital and all medications have been reviewed.  Patient denies any further concerns at this time.     Encounter Medications: Patient was recently discharged from hospital and all medications have been reviewed. Outpatient Encounter Prescriptions as of 01/05/2016  Medication Sig  . acetaminophen (TYLENOL) 500 MG tablet Take 1,000 mg by mouth every 8 (eight) hours as needed.  Marland Kitchen albuterol (PROVENTIL HFA;VENTOLIN HFA) 108 (90 Base) MCG/ACT inhaler Inhale 1-2 puffs into the lungs every 6 (six) hours as needed for wheezing or shortness of breath.  . allopurinol (ZYLOPRIM) 100 MG tablet Take 1 tablet (100 mg total) by mouth  daily.  Marland Kitchen ALPRAZolam (XANAX) 0.25 MG tablet Take 1 tablet (0.25 mg total) by mouth 3 (three) times daily as needed for anxiety.  . B Complex-C (B-COMPLEX WITH VITAMIN C) tablet Take 1 tablet by mouth daily.  . colchicine 0.6 MG tablet Take 1 tablet (0.6 mg total) by mouth 2 (two) times daily.  . cyclobenzaprine (FLEXERIL) 5 MG tablet Take 1 tablet (5 mg total) by mouth 3 (three) times daily as needed for muscle spasms.  Marland Kitchen doxycycline (VIBRA-TABS) 100 MG tablet Take 1 tablet (100 mg total) by mouth every 12 (twelve) hours.  . feeding supplement, ENSURE ENLIVE, (ENSURE ENLIVE) LIQD Take 237 mLs by mouth 3 (three) times daily between meals.  . furosemide (LASIX) 20 MG tablet Take 1 tablet (20 mg total) by mouth daily as needed for fluid.  . magnesium hydroxide (MILK OF MAGNESIA) 400 MG/5ML suspension Take 30 mLs by mouth every 4 (four) hours as needed for mild constipation.  . mirtazapine (REMERON) 15 MG tablet Take 1 tablet (15 mg total) by mouth at bedtime.  . Multiple Vitamin (MULTIVITAMIN WITH MINERALS) TABS tablet Take 1 tablet by mouth daily.  Marland Kitchen oxyCODONE (OXY IR/ROXICODONE) 5 MG immediate release tablet Take 1 tablet (5 mg total) by mouth every 6 (six) hours as needed for moderate pain or breakthrough pain.  . polyethylene glycol (MIRALAX / GLYCOLAX) packet Take 17 g by mouth daily.   No facility-administered encounter medications on file as of 01/05/2016.     Functional Status:  In your present state of health, do you have any difficulty performing the following  activities: 01/01/2016 12/23/2015  Hearing? N N  Vision? N N  Difficulty concentrating or making decisions? N N  Walking or climbing stairs? Y Y  Dressing or bathing? Y Y  Doing errands, shopping? N N  Some recent data might be hidden    Fall/Depression Screening: No flowsheet data found.  Plan:  Patient will remain active with transition of care program, and receive weekly outreaches, next telephone visit in the next  week. Will place social worker consult for transportation request.  Will send MD barrier letter.   [facility] CM Care Plan Problem One   Flowsheet Row Most Recent Value  Care Plan Problem One  At risk for hospital readmisssion related to recent discharge  Role Documenting the Problem One  Care Management [place] for Problem One  Active  THN Long Term Goal (31-90 days)  Patient will not experience a hospital readmission in the next 31 days   THN Long Term Goal Start Date  01/05/16  Interventions for Problem One Long Term Goal  Educated on the transition of care program, provided my contact information, weekly phone call scheduled , encouraged to take medication as prescribed and to call MD for concerns  THN CM Short Term Goal #1 (0-30 days)  Patient will report supplementing diet with at least 3 ensure day in the next 30 days   THN CM Short Term Goal #1 Start Date  01/05/16  Interventions for Short Term Goal #1  Discussed importance of nutrition and effects on healing , verified patient has supply of ensure.   THN CM Short Term Goal #2 (0-30 days)  Patient will report attending scheduled PCP appointment in the next  7 days   THN CM Short Term Goal #2 Start Date  01/05/16  Interventions for Short Term Goal #2  Reviewed the importance of attending PCP post hospital visit, verified patient has transportation .      Joylene Draft, RN, [place] Management [phone]- Mobile [phone]- Toll Free Main Office

## 2016-01-06 LAB — CULTURE, BLOOD (ROUTINE X 2)
CULTURE: NO GROWTH
CULTURE: NO GROWTH

## 2016-01-08 ENCOUNTER — Other Ambulatory Visit: Payer: Self-pay | Admitting: *Deleted

## 2016-01-08 NOTE — Patient Outreach (Addendum)
[facility]) Care Management  01/08/2016  Jacqueline Boyd [birth year] [id number]   Transportation arranged through [facility] for patient to [place]. Jacqueline Boyd on 01/12/16 at 2:15pm Patient to be picked up between 1 &1:30pm on 01/12/16 Reservation # [id number]. Patient provided with the phone number to call to be picked up when appointment is completed-[phone].  Late Entry-It was requested that Jacqueline Boyd ride assist call patient when they are on their way to give patient time to get outside.    Jacqueline Boyd [facility] Care Management [phone]

## 2016-01-12 ENCOUNTER — Other Ambulatory Visit: Payer: Self-pay | Admitting: *Deleted

## 2016-01-12 NOTE — Patient Outreach (Addendum)
[place] [facility]) Care Management  01/12/2016  Tuere Nwosu [birth year] [id number]   Transition of care call  Placed call spoke with Mrs.Cisar HIPAA questions answered verified. Patient reports that she is doing well, she discussed visit with PCP on yesterday and got good report and working on getting stronger.    Patient is still followed by home health therapy and making good progress, tolerating meals,denies shortness of breath, weight is stable 100 at office visit, she states she is still working on making sure that she gets enough protein each day.  Patient expressed appreciation for [facility] LSCW coordinating her visit to [facility] to orthopedic appointment on today.  Patient denies any new concerns at this time.   Plan  Will plan weekly , transition of care outreach call on next week and will schedule home visit at that time as patient is agreeable.    [facility] CM Care Plan Problem One   Flowsheet Row Most Recent Value  Care Plan Problem One  At risk for hospital readmisssion related to recent discharge  Role Documenting the Problem One  Care Management [place] for Problem One  Active  THN Long Term Goal (31-90 days)  Patient will not experience a hospital readmission in the next 31 days   THN Long Term Goal Start Date  01/05/16  Interventions for Problem One Long Term Goal  Discussed weekly transition of call outreach call, reinforced importance of notifying MD of concerns  THN CM Short Term Goal #1 (0-30 days)  Patient will report supplementing diet with at least 3 ensure day in the next 30 days   THN CM Short Term Goal #1 Start Date  01/05/16  Interventions for Short Term Goal #1  Discussed importance of nutrition and effects on healing , verified patient has supply of ensure.   THN CM Short Term Goal #2 (0-30 days)  Patient will report attending scheduled PCP appointment in the next  7 days   [facility] CM Short Term Goal #2 Start Date  01/05/16  [facility] CM Short  Term Goal #2 Met Date  01/12/16      Joylene Draft, RN, [place] Management [phone]- Mobile [phone]- [place]

## 2016-01-19 ENCOUNTER — Encounter: Payer: Self-pay | Admitting: *Deleted

## 2016-01-19 ENCOUNTER — Other Ambulatory Visit: Payer: Self-pay | Admitting: *Deleted

## 2016-01-19 NOTE — Patient Outreach (Signed)
[facility]) Care Management  01/19/2016  Jacqueline Boyd 1940-05-31 [id number]   Transition of care call  Placed call to Jacqueline Boyd , states things are going well. 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 Patient states she has follow up appointment with three weeks and they will review xrays and discuss risers at that time.  Jacqueline Boyd states she is still has visist with home health RN and physical therapy , states she is tired after physical therapy sessions.  Patient denies shortness of breath, no swelling, does not have scale for  weighing at home talks about finance concerns, discussed that she is malnourished has has been for a while, eats all she can, drinks ensure and eats protein bars. Patient discussed being started on remeron a few weeks ago but doesn't think it is helping much.   Jacqueline Boyd has declined a home visit or need for follow up by  health coach, she is agreeable to continued transition of care weekly calls.  Plan Will follow up with patient in the next week for weekly transition of care outreach.  Will send message to [facility] secretary regarding ordering scales for patient.    [facility] CM Care Plan Problem One   Flowsheet Row Most Recent Value  Care Plan Problem One  At risk for hospital readmisssion related to recent discharge  Role Documenting the Problem One  Care Management [place] for Problem One  Active  THN Long Term Goal (31-90 days)  Patient will not experience a hospital readmission in the next 31 days   THN Long Term Goal Start Date  01/05/16  Interventions for Problem One Long Term Goal  Encouraged to notify MD of new or worsening symptoms,   THN CM Short Term Goal #1 (0-30 days)  Patient will report supplementing diet with at least 3 ensure day in the next 30 days   THN CM Short Term Goal #1  Start Date  01/05/16  Interventions for Short Term Goal #1  Reinforced nutrition role in healing and preventing infection  THN CM Short Term Goal #2 (0-30 days)  Patient will report attending scheduled PCP appointment in the next  7 days   THN CM Short Term Goal #2 Start Date  01/05/16  [facility] CM Short Term Goal #2 Met Date  01/12/16  THN CM Short Term Goal #3 (0-30 days)  Patient will obtain scales and begin to weigh at least weekly in the next 14 days   THN CM Short Term Goal #3 Start Date  01/19/16  Interventions for Short [place] Goal #3  Message to [facility] secretary to order patient scales, discussed with importance on keeping a eye on weigh for sudden changes.   THN CM Short Term Goal #4 (0-30 days)  Patient will be able to recall at least 3 signs of heart failure in the next  14 days   THN CM Short Term Goal #4 Start Date  01/19/16  Interventions for Short Term Goal #4  Reviewed yellow zone signs of heart failure, will request Heart failure packet and magnet be mailed to patient .      Jacqueline Draft, RN, [place] Management [phone]- Mobile [phone]- Toll Free Main Office

## 2016-01-26 ENCOUNTER — Other Ambulatory Visit: Payer: Self-pay | Admitting: *Deleted

## 2016-01-26 NOTE — Patient Outreach (Signed)
[place] [facility]) Care Management  01/26/2016  Triana Nesheiwat 1941-03-17 [id number]   Transition of care   Placed call to Mrs.Veiga, HIPAA verified , patient reports that she is doing "just fine". Patient denies shortness of breath swelling, increased cough.  Mrs.Goedken report home health RN is still changing wound dressing 2 to 3 times a week, awaiting visit on today. Mrs.Walberg discussed recent visit from palliative care,states" it was just a one time visit"  Asked Mrs.Moylan about benefit of receiving written information for Heart Failure she has declined, stating she was aware of when to notify MD. Patient continues to decline home visit from community care Coordinator and declines need for heath coach follow when providing education on program.   Plan Will place final transition of care call on next week.    Joylene Draft, RN, [place] Management [phone]- Mobile [phone]- Toll Free Main Office

## 2016-02-02 ENCOUNTER — Other Ambulatory Visit: Payer: Self-pay | Admitting: *Deleted

## 2016-02-02 NOTE — Patient Outreach (Signed)
[name]) Care Management  02/02/2016  Jacqueline Boyd 1940-10-06 [id number]   Transition of care call   Spoke with Mrs. Jacqueline, Boyd compliance verified, patient reports that she is doing well. She denies chest discomfort, increased shortness of breath or swelling. Patient reports that she has received scales and weighed today, 99.1 pounds.  Patient reports that she is eating more.,   Mrs.Jacqueline Boyd discussed that she has a upcoming orthopedic appointment on next week to follow up on getting riser for her shoes, states that since her hip surgery one leg is longer than the other.  Mrs.Jacqueline Boyd states the home health RN is still visiting and changing dressings on her leg. Patient states physical therapy is not seeing her at this time, will return after she gets riser for her shoe.  Mrs.Jacqueline Boyd family will provided transportation to her next appointment, but she expressed appreciation regarding being provided information on [employer] transportation that she was not aware of .  Patient denies any new concerns, she continues to decline home visit but is agreeable to home visit. Discussed final transition of care call on next week, introduced  the health coach program for telephone education and support of chronic disease , Mrs.Jacqueline Boyd will think about it and let me know at next visit.  Plan Will place final transition of care call on next week, and assess for further community care management needs and determine if patient agreeable to follow up by health coach.   Jacqueline [facility] CM Care Plan Problem One   Flowsheet Row Most Recent Value  Care Plan Problem One  At risk for hospital readmisssion related to recent discharge  Role Documenting the Problem One  Care Management [place] for Problem One  Active  [facility] Long Term Goal (31-90 days)  Patient will not experience a hospital readmission in the next 31 days   [facility] Long Term Goal Start Date  01/05/16  Interventions for Problem One  Long Term Goal  Discussed with patient importance of notifying MD of increased shortness of breath, or swelling sooner to arrange office  visit   [facility] CM Short Term Goal #1 (0-30 days)  Patient will report supplementing diet with at least 3 ensure day in the next 30 days   [facility] CM Short Term Goal #1 Start Date  01/05/16  [facility] CM Short Term Goal #1 Met Date  01/26/16  [facility] CM Short Term Goal #2 (0-30 days)  Patient will report attending scheduled PCP appointment in the next  7 days   [facility] CM Short Term Goal #2 Start Date  01/05/16  [facility] CM Short Term Goal #2 Met Date  01/12/16  [facility] CM Short Term Goal #3 (0-30 days)  Patient will obtain scales and begin to weigh at least weekly in the next 14 days   [facility] CM Short Term Goal #3 Start Date  01/19/16  Interventions for Short [place]ern Goal #3  Encouraged patient to beging to weigh, and keep a record, to keep a watch on weight gains   [facility] CM Short Term Goal #4 (0-30 days)  Patient will be able to recall at least 3 signs of heart failure in the next  14 days   [facility] CM Short Term Goal #4 Start Date  01/19/16  [facility] CM Short Term Goal #4 Met Date  01/26/16      Joylene Draft, RN, [place] Management [phone] Mobile [phone]- [place]

## 2016-02-09 ENCOUNTER — Encounter: Payer: Self-pay | Admitting: *Deleted

## 2016-02-09 ENCOUNTER — Ambulatory Visit: Payer: Self-pay | Admitting: *Deleted

## 2016-02-09 ENCOUNTER — Other Ambulatory Visit: Payer: Self-pay | Admitting: *Deleted

## 2016-02-09 NOTE — Patient Outreach (Signed)
[place] [facility]) Care Management  02/09/2016  Jaylen Claude 1941-01-06 [id number]   Final transition of care call  Spoke with Mrs.Preuss , HIPAA questions answered. Patient reports she is doing fine. Patient discussed her visit to orthopedic on yesterday,and good report that her hip incision is healing nicely. Patient has prescription for risers for her shoes and has visited one ortho device shop regarding getting riser for her shoes.   Patient discussed that she is still being followed by home health RN, and wounds on her legs have healed completely . Patient also mentioned that during her MD visit yesterday, she caught her left ankle area on pedal of wheelchair and split skin, so area now has dressing wrap in place states home health RN to follow up on next week.  Patient reports she continues to eat well and her weigh is up to 99.9. Patient denies increase in shortness of breath or swelling. Patient has successfully completed the transition of care program.  Patient agrees that her care management needs have been met during transition of care program. Patient discussed that her heart failure and copd are managed well and she is able to state symptoms of concerns to notify MD of takes medications as prescribed and has consistent follow up with MD. Discusses with patient health coach program and she declines need for further telephonic follow up.  Encouraged patient to contact [facility] if further care management needs arise.  Plan Will close case per protocol.   Joylene Draft, RN, [place] Management [phone]- Mobile [phone]- Toll Free Main Office

## 2016-05-01 ENCOUNTER — Inpatient Hospital Stay: Payer: Commercial Managed Care - HMO

## 2016-05-01 ENCOUNTER — Encounter: Payer: Self-pay | Admitting: Emergency Medicine

## 2016-05-01 ENCOUNTER — Inpatient Hospital Stay
Admission: EM | Admit: 2016-05-01 | Discharge: 2016-05-27 | DRG: 871 | Disposition: E | Payer: Commercial Managed Care - HMO | Attending: Internal Medicine | Admitting: Internal Medicine

## 2016-05-01 ENCOUNTER — Emergency Department: Payer: Commercial Managed Care - HMO

## 2016-05-01 DIAGNOSIS — I5023 Acute on chronic systolic (congestive) heart failure: Secondary | ICD-10-CM | POA: Diagnosis present

## 2016-05-01 DIAGNOSIS — I130 Hypertensive heart and chronic kidney disease with heart failure and stage 1 through stage 4 chronic kidney disease, or unspecified chronic kidney disease: Secondary | ICD-10-CM | POA: Diagnosis present

## 2016-05-01 DIAGNOSIS — J9621 Acute and chronic respiratory failure with hypoxia: Secondary | ICD-10-CM | POA: Diagnosis not present

## 2016-05-01 DIAGNOSIS — Z888 Allergy status to other drugs, medicaments and biological substances status: Secondary | ICD-10-CM

## 2016-05-01 DIAGNOSIS — E875 Hyperkalemia: Secondary | ICD-10-CM | POA: Diagnosis not present

## 2016-05-01 DIAGNOSIS — Z66 Do not resuscitate: Secondary | ICD-10-CM | POA: Diagnosis not present

## 2016-05-01 DIAGNOSIS — F419 Anxiety disorder, unspecified: Secondary | ICD-10-CM | POA: Diagnosis present

## 2016-05-01 DIAGNOSIS — R6521 Severe sepsis with septic shock: Secondary | ICD-10-CM | POA: Diagnosis present

## 2016-05-01 DIAGNOSIS — R05 Cough: Secondary | ICD-10-CM

## 2016-05-01 DIAGNOSIS — K921 Melena: Secondary | ICD-10-CM | POA: Diagnosis not present

## 2016-05-01 DIAGNOSIS — K567 Ileus, unspecified: Secondary | ICD-10-CM | POA: Diagnosis not present

## 2016-05-01 DIAGNOSIS — I5033 Acute on chronic diastolic (congestive) heart failure: Secondary | ICD-10-CM | POA: Diagnosis not present

## 2016-05-01 DIAGNOSIS — I248 Other forms of acute ischemic heart disease: Secondary | ICD-10-CM | POA: Diagnosis present

## 2016-05-01 DIAGNOSIS — E43 Unspecified severe protein-calorie malnutrition: Secondary | ICD-10-CM | POA: Diagnosis present

## 2016-05-01 DIAGNOSIS — J441 Chronic obstructive pulmonary disease with (acute) exacerbation: Secondary | ICD-10-CM | POA: Diagnosis present

## 2016-05-01 DIAGNOSIS — R140 Abdominal distension (gaseous): Secondary | ICD-10-CM

## 2016-05-01 DIAGNOSIS — A0472 Enterocolitis due to Clostridium difficile, not specified as recurrent: Secondary | ICD-10-CM | POA: Diagnosis not present

## 2016-05-01 DIAGNOSIS — Z87891 Personal history of nicotine dependence: Secondary | ICD-10-CM

## 2016-05-01 DIAGNOSIS — K922 Gastrointestinal hemorrhage, unspecified: Secondary | ICD-10-CM | POA: Diagnosis present

## 2016-05-01 DIAGNOSIS — N170 Acute kidney failure with tubular necrosis: Secondary | ICD-10-CM | POA: Diagnosis present

## 2016-05-01 DIAGNOSIS — N179 Acute kidney failure, unspecified: Secondary | ICD-10-CM

## 2016-05-01 DIAGNOSIS — R059 Cough, unspecified: Secondary | ICD-10-CM

## 2016-05-01 DIAGNOSIS — A414 Sepsis due to anaerobes: Secondary | ICD-10-CM | POA: Diagnosis present

## 2016-05-01 DIAGNOSIS — E860 Dehydration: Secondary | ICD-10-CM | POA: Diagnosis present

## 2016-05-01 DIAGNOSIS — Z885 Allergy status to narcotic agent status: Secondary | ICD-10-CM | POA: Diagnosis not present

## 2016-05-01 DIAGNOSIS — R627 Adult failure to thrive: Secondary | ICD-10-CM | POA: Diagnosis present

## 2016-05-01 DIAGNOSIS — J9602 Acute respiratory failure with hypercapnia: Secondary | ICD-10-CM | POA: Diagnosis not present

## 2016-05-01 DIAGNOSIS — I272 Pulmonary hypertension, unspecified: Secondary | ICD-10-CM | POA: Diagnosis present

## 2016-05-01 DIAGNOSIS — Z880 Allergy status to penicillin: Secondary | ICD-10-CM

## 2016-05-01 DIAGNOSIS — R0603 Acute respiratory distress: Secondary | ICD-10-CM

## 2016-05-01 DIAGNOSIS — G9341 Metabolic encephalopathy: Secondary | ICD-10-CM | POA: Diagnosis not present

## 2016-05-01 DIAGNOSIS — I429 Cardiomyopathy, unspecified: Secondary | ICD-10-CM | POA: Diagnosis not present

## 2016-05-01 DIAGNOSIS — R0600 Dyspnea, unspecified: Secondary | ICD-10-CM | POA: Diagnosis not present

## 2016-05-01 DIAGNOSIS — I509 Heart failure, unspecified: Secondary | ICD-10-CM | POA: Diagnosis not present

## 2016-05-01 DIAGNOSIS — J9622 Acute and chronic respiratory failure with hypercapnia: Secondary | ICD-10-CM | POA: Diagnosis not present

## 2016-05-01 DIAGNOSIS — J9600 Acute respiratory failure, unspecified whether with hypoxia or hypercapnia: Secondary | ICD-10-CM

## 2016-05-01 DIAGNOSIS — Z4659 Encounter for fitting and adjustment of other gastrointestinal appliance and device: Secondary | ICD-10-CM

## 2016-05-01 DIAGNOSIS — Z8249 Family history of ischemic heart disease and other diseases of the circulatory system: Secondary | ICD-10-CM

## 2016-05-01 DIAGNOSIS — M109 Gout, unspecified: Secondary | ICD-10-CM | POA: Diagnosis present

## 2016-05-01 DIAGNOSIS — R188 Other ascites: Secondary | ICD-10-CM | POA: Diagnosis present

## 2016-05-01 DIAGNOSIS — I5022 Chronic systolic (congestive) heart failure: Secondary | ICD-10-CM | POA: Diagnosis not present

## 2016-05-01 DIAGNOSIS — Z515 Encounter for palliative care: Secondary | ICD-10-CM | POA: Diagnosis not present

## 2016-05-01 DIAGNOSIS — Z6820 Body mass index (BMI) 20.0-20.9, adult: Secondary | ICD-10-CM

## 2016-05-01 DIAGNOSIS — N171 Acute kidney failure with acute cortical necrosis: Secondary | ICD-10-CM | POA: Diagnosis not present

## 2016-05-01 DIAGNOSIS — N184 Chronic kidney disease, stage 4 (severe): Secondary | ICD-10-CM | POA: Diagnosis present

## 2016-05-01 DIAGNOSIS — I482 Chronic atrial fibrillation: Secondary | ICD-10-CM | POA: Diagnosis not present

## 2016-05-01 DIAGNOSIS — D500 Iron deficiency anemia secondary to blood loss (chronic): Secondary | ICD-10-CM | POA: Diagnosis present

## 2016-05-01 LAB — URINALYSIS, COMPLETE (UACMP) WITH MICROSCOPIC
Bacteria, UA: NONE SEEN
SPECIFIC GRAVITY, URINE: 1.04 — AB (ref 1.005–1.030)
Squamous Epithelial / LPF: NONE SEEN

## 2016-05-01 LAB — COMPREHENSIVE METABOLIC PANEL
ALBUMIN: 3 g/dL — AB (ref 3.5–5.0)
ALK PHOS: 170 U/L — AB (ref 38–126)
ALT: 29 U/L (ref 14–54)
AST: 34 U/L (ref 15–41)
Anion gap: 12 (ref 5–15)
BUN: 67 mg/dL — AB (ref 6–20)
CALCIUM: 8 mg/dL — AB (ref 8.9–10.3)
CHLORIDE: 98 mmol/L — AB (ref 101–111)
CO2: 27 mmol/L (ref 22–32)
CREATININE: 2.78 mg/dL — AB (ref 0.44–1.00)
GFR calc non Af Amer: 15 mL/min — ABNORMAL LOW (ref >60)
GFR, EST AFRICAN AMERICAN: 18 mL/min — AB (ref >60)
GLUCOSE: 88 mg/dL (ref 65–99)
Potassium: 5 mmol/L (ref 3.5–5.1)
SODIUM: 137 mmol/L (ref 135–145)
Total Bilirubin: 0.5 mg/dL (ref 0.3–1.2)
Total Protein: 6.3 g/dL — ABNORMAL LOW (ref 6.5–8.1)

## 2016-05-01 LAB — CBC WITH DIFFERENTIAL/PLATELET
BASOS ABS: 0 10*3/uL (ref 0–0.1)
Basophils Relative: 0 %
EOS ABS: 0 10*3/uL (ref 0–0.7)
EOS PCT: 0 %
HCT: 33.8 % — ABNORMAL LOW (ref 35.0–47.0)
HEMOGLOBIN: 10.6 g/dL — AB (ref 12.0–16.0)
LYMPHS ABS: 0.4 10*3/uL — AB (ref 1.0–3.6)
LYMPHS PCT: 3 %
MCH: 26.2 pg (ref 26.0–34.0)
MCHC: 31.3 g/dL — ABNORMAL LOW (ref 32.0–36.0)
MCV: 83.6 fL (ref 80.0–100.0)
Monocytes Absolute: 0.6 10*3/uL (ref 0.2–0.9)
Monocytes Relative: 5 %
NEUTROS PCT: 92 %
Neutro Abs: 11.1 10*3/uL — ABNORMAL HIGH (ref 1.4–6.5)
PLATELETS: 247 10*3/uL (ref 150–440)
RBC: 4.05 MIL/uL (ref 3.80–5.20)
RDW: 17.5 % — ABNORMAL HIGH (ref 11.5–14.5)
WBC: 12.1 10*3/uL — AB (ref 3.6–11.0)

## 2016-05-01 LAB — GLUCOSE, CAPILLARY: GLUCOSE-CAPILLARY: 120 mg/dL — AB (ref 65–99)

## 2016-05-01 LAB — TROPONIN I: Troponin I: 0.2 ng/mL (ref <0.03)

## 2016-05-01 LAB — TYPE AND SCREEN
ABO/RH(D): A POS
Antibody Screen: NEGATIVE

## 2016-05-01 LAB — BRAIN NATRIURETIC PEPTIDE: B NATRIURETIC PEPTIDE 5: 810 pg/mL — AB (ref 0.0–100.0)

## 2016-05-01 LAB — PROCALCITONIN: PROCALCITONIN: 1.71 ng/mL

## 2016-05-01 LAB — PROTIME-INR
INR: 1.16
Prothrombin Time: 14.9 seconds (ref 11.4–15.2)

## 2016-05-01 LAB — APTT: aPTT: 36 seconds (ref 24–36)

## 2016-05-01 MED ORDER — ALPRAZOLAM 0.25 MG PO TABS: 0.2500 mg | ORAL_TABLET | Freq: Three times a day (TID) | ORAL | Status: DC | PRN

## 2016-05-01 MED ORDER — SODIUM CHLORIDE 0.9 % IV BOLUS (SEPSIS)
500.0000 mL | Freq: Once | INTRAVENOUS | Status: AC
  Administered 2016-05-01: 500 mL via INTRAVENOUS

## 2016-05-01 MED ORDER — SODIUM CHLORIDE 0.9% FLUSH
3.0000 mL | Freq: Two times a day (BID) | INTRAVENOUS | Status: DC
  Administered 2016-05-01 – 2016-05-06 (×10): 3 mL via INTRAVENOUS

## 2016-05-01 MED ORDER — ACETAMINOPHEN 650 MG RE SUPP
650.0000 mg | Freq: Four times a day (QID) | RECTAL | Status: DC | PRN
  Filled 2016-05-01: qty 1

## 2016-05-01 MED ORDER — FENTANYL CITRATE (PF) 100 MCG/2ML IJ SOLN
INTRAMUSCULAR | Status: AC
  Administered 2016-05-01: 100 ug
  Filled 2016-05-01: qty 2

## 2016-05-01 MED ORDER — MIDAZOLAM HCL 5 MG/5ML IJ SOLN
INTRAMUSCULAR | Status: AC
  Administered 2016-05-01: 3 mg
  Filled 2016-05-01: qty 5

## 2016-05-01 MED ORDER — FUROSEMIDE 10 MG/ML IJ SOLN
20.0000 mg | Freq: Two times a day (BID) | INTRAMUSCULAR | Status: DC
  Administered 2016-05-01 – 2016-05-03 (×5): 20 mg via INTRAVENOUS
  Filled 2016-05-01 (×5): qty 2

## 2016-05-01 MED ORDER — PHENYLEPHRINE HCL 10 MG/ML IJ SOLN
0.0000 ug/min | INTRAVENOUS | Status: DC
  Administered 2016-05-01: 20 ug/min via INTRAVENOUS
  Filled 2016-05-01: qty 1

## 2016-05-01 MED ORDER — AZITHROMYCIN 500 MG PO TABS: 500.0000 mg | ORAL_TABLET | Freq: Every day | ORAL | Status: DC

## 2016-05-01 MED ORDER — IPRATROPIUM-ALBUTEROL 0.5-2.5 (3) MG/3ML IN SOLN: 3.0000 mL | RESPIRATORY_TRACT | Status: DC | PRN

## 2016-05-01 MED ORDER — COLCHICINE 0.6 MG PO TABS
0.6000 mg | ORAL_TABLET | Freq: Two times a day (BID) | ORAL | Status: DC
  Administered 2016-05-01: 0.6 mg via ORAL
  Filled 2016-05-01: qty 1

## 2016-05-01 MED ORDER — ROCURONIUM BROMIDE 50 MG/5ML IV SOLN
INTRAVENOUS | Status: AC
  Filled 2016-05-01: qty 1

## 2016-05-01 MED ORDER — CYCLOBENZAPRINE HCL 10 MG PO TABS: 5.0000 mg | ORAL_TABLET | Freq: Three times a day (TID) | ORAL | Status: DC | PRN

## 2016-05-01 MED ORDER — ONDANSETRON HCL 4 MG PO TABS
4.0000 mg | ORAL_TABLET | Freq: Four times a day (QID) | ORAL | Status: DC | PRN
Start: 2016-05-01 — End: 2016-05-06

## 2016-05-01 MED ORDER — ONDANSETRON HCL 4 MG/2ML IJ SOLN
4.0000 mg | Freq: Four times a day (QID) | INTRAMUSCULAR | Status: DC | PRN
  Administered 2016-05-01: 4 mg via INTRAVENOUS
  Filled 2016-05-01: qty 2

## 2016-05-01 MED ORDER — OXYCODONE HCL 5 MG PO TABS
5.0000 mg | ORAL_TABLET | Freq: Four times a day (QID) | ORAL | Status: DC | PRN
  Administered 2016-05-01: 5 mg via ORAL
  Filled 2016-05-01: qty 1

## 2016-05-01 MED ORDER — MIRTAZAPINE 15 MG PO TABS
15.0000 mg | ORAL_TABLET | Freq: Every day | ORAL | Status: DC
  Administered 2016-05-01: 15 mg via ORAL
  Filled 2016-05-01: qty 1

## 2016-05-01 MED ORDER — FENTANYL CITRATE (PF) 100 MCG/2ML IJ SOLN: 50.0000 ug | Freq: Once | INTRAMUSCULAR | Status: DC

## 2016-05-01 MED ORDER — ADULT MULTIVITAMIN W/MINERALS CH
1.0000 | ORAL_TABLET | Freq: Every day | ORAL | Status: DC
  Administered 2016-05-01: 1 via ORAL
  Filled 2016-05-01: qty 1

## 2016-05-01 MED ORDER — ALLOPURINOL 100 MG PO TABS
100.0000 mg | ORAL_TABLET | Freq: Every day | ORAL | Status: DC
  Administered 2016-05-01: 100 mg via ORAL
  Filled 2016-05-01: qty 1

## 2016-05-01 MED ORDER — ACETAMINOPHEN 325 MG PO TABS
650.0000 mg | ORAL_TABLET | Freq: Four times a day (QID) | ORAL | Status: DC | PRN
  Administered 2016-05-03 (×2): 650 mg via ORAL
  Filled 2016-05-01 (×2): qty 2

## 2016-05-01 MED ORDER — FENTANYL BOLUS VIA INFUSION
25.0000 ug | INTRAVENOUS | Status: DC | PRN
  Filled 2016-05-01: qty 25

## 2016-05-01 MED ORDER — NALOXONE HCL 0.4 MG/ML IJ SOLN
INTRAMUSCULAR | Status: AC
  Filled 2016-05-01: qty 1

## 2016-05-01 MED ORDER — ROCURONIUM BROMIDE 50 MG/5ML IV SOLN
INTRAVENOUS | Status: AC
  Administered 2016-05-01: 10 mg
  Filled 2016-05-01: qty 1

## 2016-05-01 MED ORDER — PANTOPRAZOLE SODIUM 40 MG IV SOLR
40.0000 mg | Freq: Two times a day (BID) | INTRAVENOUS | Status: DC
  Administered 2016-05-01 – 2016-05-05 (×10): 40 mg via INTRAVENOUS
  Filled 2016-05-01 (×10): qty 40

## 2016-05-01 MED ORDER — FENTANYL 2500MCG IN NS 250ML (10MCG/ML) PREMIX INFUSION
25.0000 ug/h | INTRAVENOUS | Status: DC
  Administered 2016-05-01: 25 ug/h via INTRAVENOUS
  Filled 2016-05-01: qty 250

## 2016-05-01 NOTE — ED Notes (Signed)
Pt awaiting bed assignment 

## 2016-05-01 NOTE — Progress Notes (Addendum)
PULMONARY / CRITICAL CARE MEDICINE   Name: Jacqueline Boyd MRN: [id number] DOB: 1941-02-16    ADMISSION DATE:  04/27/2016 CONSULTATION DATE:  05/16/2016  REFERRING MD: Dr. Lavetta Nielsen  CHIEF COMPLAINT:  Acute Respiratory Distress  HISTORY OF PRESENT ILLNESS:   Jacqueline Boyd is a 75 yo female with a known medical history of CHF, COPD with emphysema and hypertension.  Patient presented to [facility] with upper GI bleed .  Patient has been complaining of lower extremity edema and congestion,  Her PCP started on Azithromycin and Mucinex but without any results. Patient was admitted by Hospitalist team to the [place] floor. Close to 18:40 Patient was found unresponsive with O2 sats down to 80% RRT was called and patient was transferred to the ICU.  Patient was intubated in the ICU due to respiratory distress.  PAST MEDICAL HISTORY :  She  has a past medical history of CHF (congestive heart failure) ([place]); COPD with emphysema ([place]); and Hypertension.  PAST SURGICAL HISTORY: She  has a past surgical history that includes Hip surgery (Right) and Tumor removal.  Allergies  Allergen Reactions  . Codeine Other (See Comments)    Itching and "feels like worms crawling in body".  . Penicillins Rash    Has patient had a PCN reaction causing immediate rash, facial/tongue/throat swelling, SOB or lightheadedness with hypotension: No  Has patient had a PCN reaction causing severe rash involving mucus membranes or skin necrosis: No Has patient had a PCN reaction that required hospitalization: No   Has patient had a PCN reaction occurring within the last 10 years: No  If all of the above answers are "NO", then may proceed with Cephalosporin use.   . Prednisone Palpitations    No current facility-administered medications on file prior to encounter.    Current Outpatient Prescriptions on File Prior to Encounter  Medication Sig  . acetaminophen (TYLENOL) 500 MG tablet Take 1,000 mg by mouth every 8 (eight) hours as  needed.  Marland Kitchen albuterol (PROVENTIL HFA;VENTOLIN HFA) 108 (90 Base) MCG/ACT inhaler Inhale 1-2 puffs into the lungs every 6 (six) hours as needed for wheezing or shortness of breath.  . allopurinol (ZYLOPRIM) 100 MG tablet Take 1 tablet (100 mg total) by mouth daily.  Marland Kitchen ALPRAZolam (XANAX) 0.25 MG tablet Take 1 tablet (0.25 mg total) by mouth 3 (three) times daily as needed for anxiety.  . colchicine 0.6 MG tablet Take 1 tablet (0.6 mg total) by mouth 2 (two) times daily.  . cyclobenzaprine (FLEXERIL) 5 MG tablet Take 1 tablet (5 mg total) by mouth 3 (three) times daily as needed for muscle spasms.  . furosemide (LASIX) 20 MG tablet Take 1 tablet (20 mg total) by mouth daily as needed for fluid. (Patient taking differently: Take 80 mg by mouth daily as needed for fluid. )  . magnesium hydroxide (MILK OF MAGNESIA) 400 MG/5ML suspension Take 30 mLs by mouth every 4 (four) hours as needed for mild constipation.  . mirtazapine (REMERON) 15 MG tablet Take 1 tablet (15 mg total) by mouth at bedtime.  . Multiple Vitamin (MULTIVITAMIN WITH MINERALS) TABS tablet Take 1 tablet by mouth daily.  Marland Kitchen oxyCODONE (OXY IR/ROXICODONE) 5 MG immediate release tablet Take 1 tablet (5 mg total) by mouth every 6 (six) hours as needed for moderate pain or breakthrough pain.  Marland Kitchen doxycycline (VIBRA-TABS) 100 MG tablet Take 1 tablet (100 mg total) by mouth every 12 (twelve) hours. (Patient not taking: Reported on 05/12/2016)  . feeding supplement, ENSURE ENLIVE, (ENSURE ENLIVE)  LIQD Take 237 mLs by mouth 3 (three) times daily between meals. (Patient not taking: Reported on 04/30/2016)  . polyethylene glycol (MIRALAX / GLYCOLAX) packet Take 17 g by mouth daily. (Patient not taking: Reported on 05/07/2016)    FAMILY HISTORY:  Her indicated that her mother is deceased. She indicated that her father is deceased. She indicated that the status of her other is unknown.    SOCIAL HISTORY: She  reports that she quit smoking about 25 years  ago. She has never used smokeless tobacco. She reports that she drinks alcohol. She reports that she does not use drugs.  REVIEW OF SYSTEMS:   Unable to obtain as the Patient is intubated and critically ill  SUBJECTIVE:  Unable to obtain as the patient is intubated  VITAL SIGNS: BP (!) 111/48 (BP Location: Right Arm)   Pulse 90   Temp 97.6 F (36.4 C) (Oral)   Resp 15   Ht 5\' 5"  (1.651 m)   Wt 56.3 kg (124 lb 1.9 oz)   SpO2 98%   BMI 20.65 kg/m   HEMODYNAMICS:    VENTILATOR SETTINGS: Vent Mode: PRVC FiO2 (%):  [40 %] 40 % Set Rate:  [15 bmp] 15 bmp Vt Set:  [400 mL] 400 mL PEEP:  [5 cmH20] 5 cmH20  INTAKE / OUTPUT: I/O last 3 completed shifts: In: 52 [I.V.:3; IV Piggyback:500] Out: 0   PHYSICAL EXAMINATION: General:  Frail elderly white female, intubated and on mechanical ventilation. Neuro:  Follows simple commands HEENT: Sunken eyes, Atraumatic, Normocephalic, upward gaze, pupils 3+briskly reacting. Cardiovascular: S1S2, Irregularly Irregular, murmur noted Lungs:  Diminished bibasilar, no wheezes, crackles, rhonchi noted Abdomen:  Flat, soft, nontender, active bowel sound Musculoskeletal: b/l lower extremity edema and blisters present. Skin:  Dressing CDI on left lower extremity  LABS:  BMET  Recent Labs Lab 05/14/2016 0751  NA 137  K 5.0  CL 98*  CO2 27  BUN 67*  CREATININE 2.78*  GLUCOSE 88    Electrolytes  Recent Labs Lab 05/24/2016 0751  CALCIUM 8.0*    CBC  Recent Labs Lab 05/13/2016 0751  WBC 12.1*  HGB 10.6*  HCT 33.8*  PLT 247    Coag's  Recent Labs Lab 05/07/2016 0751  APTT 36  INR 1.16    Sepsis Markers  Recent Labs Lab 04/30/2016 0828  PROCALCITON 1.71    ABG No results for input(s): PHART, PCO2ART, PO2ART in the last 168 hours.  Liver Enzymes  Recent Labs Lab 05/21/2016 0751  AST 34  ALT 29  ALKPHOS 170*  BILITOT 0.5  ALBUMIN 3.0*    Cardiac Enzymes  Recent Labs Lab 05/05/2016 0751  TROPONINI 0.20*     Glucose  Recent Labs Lab 05/12/2016 1806  GLUCAP 120*    Imaging Dg Chest Port 1 View  Result Date: 05/04/2016 CLINICAL DATA:  Plaque Tardieu stools began yesterday common lower leg swelling, history of CHF EXAM: PORTABLE CHEST 1 VIEW COMPARISON:  Chest x-ray of 12/22/2015 and CT angiogram of the chest of the same date FINDINGS: There are now bilateral pleural effusions present with some basilar volume loss. With cardiomegaly and somewhat indistinct perihilar vasculature these findings may indicate mild congestive heart failure. The bones are somewhat osteopenic. IMPRESSION: Probable CHF with cardiomegaly, small effusions, and minimal pulmonary vascular congestion. Electronically Signed   By: Ivar Drape M.D.   On: 05/05/2016 08:27     STUDIES:  12/22/15 CT angio Chest>>No acute abnormality. No evidence of pulmonary emboli  [id number] echo>> Diastolic  dysfunction- Grade-2,EF>70%  CULTURES: None  ANTIBIOTICS: none  SIGNIFICANT EVENTS: 12/6>> Patient transferred  to ICU unresponsive and hypoxemic, required emergent intubation  LINES/TUBES: none   ASSESSMENT / PLAN:  PULMONARY A: Acute On chronic Respiratory Failure COPD exacerbation Bilateral Pleural effussion P:   Continue full vent support SBT trials in am  Routine ABG  Continue lasix PRN Bronchodilators CARDIOVASCULAR A:  Diastolic Dysfunction grade -2 ?Cardiogenic shock Elevated troponin possibly due to demand ischemia P:  Continuous Telemetry neosynephrine ECHO pending Keep MAP goals>60 Cardiology consulted RENAL A:   Acute Renal failure P:   Nephrology consulted Replace electrolytes per ICU protocol Avoid nephrotoxic drugs  GASTROINTESTINAL A:   Upper GI bleed P: NPO   Continue Protonix  HEMATOLOGIC A:   No active issues P:  H/H stable Transfuse if Hgb<7  INFECTIOUS A:   ?CAP P:   procalcitonin 1.71 Azithromycin X5 days  ENDOCRINE A:   No active issues P:   Monitor BS  intermittently with BMP  NEUROLOGIC A:   Anxiety P:   RASS goal: 0 to -1 Continue Alprazolam Frequent reorientation Lights on during day   FAMILY  - Updates: Son was updated regarding the patient's condition.  Son has decided to make the patient DNR at this time   Bincy Varughese,AG-ACNP Pulmonary and [place]   05/10/2016, 8:27 PM   Pt. Seen and examined with NP, agree with assessment and plan.  Acute on chronic respiratory failure, currently awake and alert, writing on board. She would like to tube out. Performed weaning trial earlier this am, but patient was still overly sedated. Will stop propofol, and repeat weaning trial this am.   Code status reviewed with patient, she indicates clearly that she wants to be put back on the ventilator if her breathing fails, unless it is "hopeless", she does not want CPR as we both agree this would be hopeless.   Marda Stalker, M.D.  05/02/2016   [place].  I have personally obtained a history, examined the patient, evaluated laboratory and imaging results, formulated the assessment and plan and placed orders. The Patient requires high complexity decision making for assessment and support, frequent evaluation and titration of therapies, application of advanced monitoring technologies and extensive interpretation of multiple databases. The patient has critical illness that could lead imminently to failure of 1 or more organ systems and requires the highest level of physician preparedness to intervene.  Critical Care Time devoted to patient care services described in this note is 45 minutes and is exclusive of time spent in procedures.

## 2016-05-01 NOTE — Procedures (Signed)
Endotracheal Intubation Procedure Note  Indication for endotracheal intubation: respiratory failure. Airway Assessment: Mallampati Class: I (soft palate, uvula, fauces, and tonsillar pillars visible). Sedation: fentanyl and midazolam. Paralytic: rocuronium. Lidocaine: no. Atropine: no. Equipment: Macintosh 1 laryngoscope blade and 7.20mm cuffed endotracheal tube. Cricoid Pressure: no. Number of attempts: 1. ETT location confirmed by by auscultation and ETCO2 monitor.  Pt intubated by respiratory therapist Jacqueline Boyd no complications during procedure pt tolerated well chest xray pending  Jacqueline Boyd, [place] Pager [phone] (please enter 7 digits) PCCM Consult Pager [phone] (please enter 7 digits)

## 2016-05-01 NOTE — Significant Event (Signed)
Rapid Response Event Note  Overview: Time Called: 1812 Arrival Time: [id number] Event Type: Other (Comment) (pt unresponsive)  Initial Focused Assessment:   Pt unresponsive to verbal and tactile stimuli.  BP 129/69 o2 88-90% on 4LNC HR 80 CBG 122  Interventions: Administered 0.4 Narcan (pt recently administered scheduled (home) pain med.   Plan of Care (if not transferred): Pt remained unresponsive, pt transferred to ICU, care assumed by ICU NP  Event Summary: Name of Physician Notified: Dr. Manuella Ghazi at 1816    at    Outcome: Transferred (Comment) (Pt transferred to ICU)  Event End Time: [place]

## 2016-05-01 NOTE — ED Notes (Signed)
edp in to see pt at this time. Son at bedside.

## 2016-05-01 NOTE — ED Notes (Signed)
Pt does have an assigned bed, but is not ready at this time.

## 2016-05-01 NOTE — Progress Notes (Signed)
Jonathon Bellows MD  [address] [place], [place] [address] Phone: [phone] Fax : [phone]  Consultation  Referring Provider:     No ref. provider found Primary Care Physician:  Marinda Elk, MD Primary Gastroenterologist:           Reason for Consultation:     GI bleed  Date of Admission:  05/19/2016 Date of Consultation:  04/30/2016         HPI:   Jacqueline Boyd is a 75 y.o. female  Admitted this morning with a 1 day duration of tarry black stools. She has a history of heartfailure, COPD. She has recently been treated with antibiotics for a chest infection . Troponin is elevated today ,BNP 810.Hb 10.6 (baseline is lower close to 8 grams). On admission noted to be in AKI  When I went into speak to the patient , she was not able to answer my questions , very confused, I discussed with her nurse who confirmed the same since morning . No bowel movements seen since this morning . No history of hematemesis. The nurse did mention that her saturation were in the 60's off oxygen and was placed on oxygen.  Past Medical History:  Diagnosis Date  . CHF (congestive heart failure) ([place])   . COPD with emphysema ([place])   . Hypertension     Past Surgical History:  Procedure Laterality Date  . HIP SURGERY Right   . 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 Authorizing Provider  acetaminophen (TYLENOL) 500 MG tablet Take 1,000 mg by mouth every 8 (eight) hours as needed.   Yes Historical Provider, MD  albuterol (PROVENTIL HFA;VENTOLIN HFA) 108 (90 Base) MCG/ACT inhaler Inhale 1-2 puffs into the lungs every 6 (six) hours as needed for wheezing or shortness of breath.   Yes Historical Provider, MD  allopurinol (ZYLOPRIM) 100 MG tablet Take 1 tablet (100 mg total) by mouth daily. 01/04/16  Yes Loletha [place], MD  ALPRAZolam Duanne Moron) 0.25 MG tablet Take 1 tablet (0.25 mg total) by mouth 3 (three) times daily as needed for  anxiety. 01/04/16  Yes Loletha [place], MD  colchicine 0.6 MG tablet Take 1 tablet (0.6 mg total) by mouth 2 (two) times daily. 01/04/16  Yes Loletha [place], MD  cyclobenzaprine (FLEXERIL) 5 MG tablet Take 1 tablet (5 mg total) by mouth 3 (three) times daily as needed for muscle spasms. 01/04/16  Yes Loletha [place], MD  furosemide (LASIX) 20 MG tablet Take 1 tablet (20 mg total) by mouth daily as needed for fluid. Patient taking differently: Take 80 mg by mouth daily as needed for fluid.  01/04/16  Yes Loletha [place], MD  guaiFENesin (MUCINEX) 600 MG 12 hr tablet Take 600 mg by mouth 2 (two) times daily.   Yes Historical Provider, MD  magnesium hydroxide (MILK OF MAGNESIA) 400 MG/5ML suspension Take 30 mLs by mouth every 4 (four) hours as needed for mild constipation.   Yes Historical Provider, MD  mirtazapine (REMERON) 15 MG tablet Take 1 tablet (15 mg total) by mouth at bedtime. 01/04/16  Yes Richard Leslye Peer, MD  moxifloxacin (AVELOX) 400 MG tablet Take 1 tablet by mouth daily. 04/24/16 05/05/16 Yes Historical Provider, MD  Multiple Vitamin (MULTIVITAMIN WITH MINERALS) TABS tablet Take 1 tablet by mouth daily.   Yes Historical Provider, MD  oxyCODONE (OXY IR/ROXICODONE) 5 MG immediate release tablet Take 1 tablet (5 mg total) by  mouth every 6 (six) hours as needed for moderate pain or breakthrough pain. 01/04/16  Yes Loletha [place], MD  doxycycline (VIBRA-TABS) 100 MG tablet Take 1 tablet (100 mg total) by mouth every 12 (twelve) hours. Patient not taking: Reported on 05/12/2016 01/04/16   Loletha [place], MD  feeding supplement, ENSURE ENLIVE, (ENSURE ENLIVE) LIQD Take 237 mLs by mouth 3 (three) times daily between meals. Patient not taking: Reported on 05/07/2016 01/04/16   Loletha [place], MD  polyethylene glycol [facility] / Floria Raveling) packet Take 17 g by mouth daily. Patient not taking: Reported on 05/05/2016 01/04/16   Loletha [place], MD    Family History  Problem Relation Age of Onset  .  Hypertension Other      Social History  Substance Use Topics  . Smoking status: Former Smoker    Quit date: 11/11/1990  . Smokeless tobacco: Never Used  . Alcohol use Yes    Allergies as of 05/18/2016 - Review Complete 05/14/2016  Allergen Reaction Noted  . Codeine Other (See Comments) 11/11/2015  . Penicillins Rash 11/24/2015  . Prednisone Palpitations 11/11/2015    Review of Systems:    All systems reviewed and negative except where noted in HPI.   Physical Exam:  Vital signs in last 24 hours: Temp:  [97.6 F (36.4 C)] 97.6 F (36.4 C) (12/06 1205) Pulse Rate:  [65-88] 88 (12/06 1205) Resp:  [17-23] 18 (12/06 1205) BP: (88-122)/(34-71) 88/34 (12/06 1205) SpO2:  [69 %-100 %] 69 % (12/06 1205) Weight:  [124 lb 6.4 oz (56.4 kg)] 124 lb 6.4 oz (56.4 kg) (12/06 1205) Last BM Date: 05/12/2016 General:   Pleasant, cooperative in NAD, Ax0 x1 Head:  Normocephalic and atraumatic. Eyes:   No icterus.   Conjunctiva pink. PERRLA. Ears:  Normal auditory acuity. Neck:  Supple; no masses or thyroidomegaly Lungs: Respirations even and unlabored.air entryu b/l equal but decreased    No wheezes, crackles, or rhonchi.  Heart:  Regular rate and rhythm;  Without murmur, clicks, rubs or gallops Abdomen:  Soft, nondistended, nontender. Normal bowel sounds. No appreciable masses or hepatomegaly.  No rebound or guarding.   Extremities:  2+ b/l pitting edema cyanosis or clubbing. Neurologic:  Alert and oriented x1;   Psych:  Alert and cooperative. Normal affect.  LAB RESULTS:  Recent Labs  05/08/2016 0751  WBC 12.1*  HGB 10.6*  HCT 33.8*  PLT 247   BMET  Recent Labs  05/19/2016 0751  NA 137  K 5.0  CL 98*  CO2 27  GLUCOSE 88  BUN 67*  CREATININE 2.78*  CALCIUM 8.0*   LFT  Recent Labs  05/05/2016 0751  PROT 6.3*  ALBUMIN 3.0*  AST 34  ALT 29  ALKPHOS 170*  BILITOT 0.5   PT/INR  Recent Labs  05/16/2016 0751  LABPROT 14.9  INR 1.16    STUDIES: Dg Chest Port 1  View  Result Date: 05/25/2016 CLINICAL DATA:  Plaque Tardieu stools began yesterday common lower leg swelling, history of CHF EXAM: PORTABLE CHEST 1 VIEW COMPARISON:  Chest x-ray of 12/22/2015 and CT angiogram of the chest of the same date FINDINGS: There are now bilateral pleural effusions present with some basilar volume loss. With cardiomegaly and somewhat indistinct perihilar vasculature these findings may indicate mild congestive heart failure. The bones are somewhat osteopenic. IMPRESSION: Probable CHF with cardiomegaly, small effusions, and minimal pulmonary vascular congestion. Electronically Signed   By: Ivar Drape M.D.   On: 05/10/2016 08:27      Impression /  Plan:   Jacqueline Boyd is a 75 y.o. y/o female , we have been consulted for a GI bleed. She has a CXR,BNP and clinical picture suggestive of pulmonary edema. Hb has been stable. Based on history we can plan for an EGD when she is doing better from a cardio pulmonary point of view. In the meanwhile continue IV PPI, check H pylori stool antigen    Thank you for involving me in the care of this patient.      LOS: 0 days   Jonathon Bellows, MD  05/21/2016, 2:46 PM

## 2016-05-01 NOTE — Consult Note (Signed)
Reason for Consult: Diastolic congestive heart failure borderline troponins Referring Physician: Carroll Sage primary, [facility] hospitalist  Jacqueline Boyd is an 75 y.o. female. Presents with diastolic congestive heart failure and diarrhea history of COPD. Patient was found to have melena with anemia suggestive of GI bleed. Patient been treated recently for congestion by her primary including antibiotic therapy as well as Mucinex for congestion and cough. Patient then developed diarrhea over the last few days and now appears slightly dehydrated. Denies any fever chills or sweats has had melena but no prior per rectum denies any chest pain still has some shortness of breath. Patient's mental status is been diminished seemed lethargic and not able answer many of the questions  HPI:.Diagnosis Date  . CHF (congestive heart failure) ([place])   . COPD with emphysema ([place])   . Hypertension     Past Surgical History:  Procedure Laterality Date  . HIP SURGERY Right   . TUMOR REMOVAL     as a baby; chest    Family History  Problem Relation Age of Onset  . Hypertension Other     Social History:  reports that she quit smoking about 25 years ago. She has never used smokeless tobacco. She reports that she drinks alcohol. She reports that she does not use drugs.  Allergies:  Allergies  Allergen Reactions  . Codeine Other (See Comments)    Itching and "feels like worms crawling in body".  . Penicillins Rash    Has patient had a PCN reaction causing immediate rash, facial/tongue/throat swelling, SOB or lightheadedness with hypotension: No  Has patient had a PCN reaction causing severe rash involving mucus membranes or skin necrosis: No Has patient had a PCN reaction that required hospitalization: No   Has patient had a PCN reaction occurring within the last 10 years: No  If all of the above answers are "NO", then may proceed with Cephalosporin use.   . Prednisone Palpitations    Medications:  I have reviewed the patient's current medications.  Results for orders placed or performed during the hospital encounter of 05/24/2016 (from the past 48 hour(s))  Comprehensive metabolic panel     Status: Abnormal   Collection Time: 05/02/2016  7:51 AM  Result Value Ref Range   Sodium 137 135 - 145 mmol/L   Potassium 5.0 3.5 - 5.1 mmol/L   Chloride 98 (L) 101 - 111 mmol/L   CO2 27 22 - 32 mmol/L   Glucose, Bld 88 65 - 99 mg/dL   BUN 67 (H) 6 - 20 mg/dL   Creatinine, Ser 2.78 (H) 0.44 - 1.00 mg/dL   Calcium 8.0 (L) 8.9 - 10.3 mg/dL   Total Protein 6.3 (L) 6.5 - 8.1 g/dL   Albumin 3.0 (L) 3.5 - 5.0 g/dL   AST 34 15 - 41 U/L   ALT 29 14 - 54 U/L   Alkaline Phosphatase 170 (H) 38 - 126 U/L   Total Bilirubin 0.5 0.3 - 1.2 mg/dL   GFR calc non Af Amer 15 (L) >60 mL/min   GFR calc Af Amer 18 (L) >60 mL/min    Comment: (NOTE) The eGFR has been calculated using the CKD EPI equation. This calculation has not been validated in all clinical situations. eGFR's persistently <60 mL/min signify possible Chronic Kidney Disease.    Anion gap 12 5 - 15  Protime-INR     Status: None   Collection Time: 05/26/2016  7:51 AM  Result Value Ref Range   Prothrombin Time 14.9  11.4 - 15.2 seconds   INR 1.16   Type and screen [place]     Status: None   Collection Time: 05/03/2016  7:51 AM  Result Value Ref Range   ABO/RH(D) A POS    Antibody Screen NEG    Sample Expiration 05/04/2016   CBC with Differential     Status: Abnormal   Collection Time: 05/19/2016  7:51 AM  Result Value Ref Range   WBC 12.1 (H) 3.6 - 11.0 K/uL   RBC 4.05 3.80 - 5.20 MIL/uL   Hemoglobin 10.6 (L) 12.0 - 16.0 g/dL   HCT 33.8 (L) 35.0 - 47.0 %   MCV 83.6 80.0 - 100.0 fL   MCH 26.2 26.0 - 34.0 pg   MCHC 31.3 (L) 32.0 - 36.0 g/dL   RDW 17.5 (H) 11.5 - 14.5 %   Platelets 247 150 - 440 K/uL   Neutrophils Relative % 92 %   Neutro Abs 11.1 (H) 1.4 - 6.5 K/uL   Lymphocytes Relative 3 %   Lymphs Abs 0.4 (L) 1.0  - 3.6 K/uL   Monocytes Relative 5 %   Monocytes Absolute 0.6 0.2 - 0.9 K/uL   Eosinophils Relative 0 %   Eosinophils Absolute 0.0 0 - 0.7 K/uL   Basophils Relative 0 %   Basophils Absolute 0.0 0 - 0.1 K/uL  Brain natriuretic peptide     Status: Abnormal   Collection Time: 05/04/2016  7:51 AM  Result Value Ref Range   B Natriuretic Peptide 810.0 (H) 0.0 - 100.0 pg/mL  APTT     Status: None   Collection Time: 05/26/2016  7:51 AM  Result Value Ref Range   aPTT 36 24 - 36 seconds  Troponin I     Status: Abnormal   Collection Time: 05/10/2016  7:51 AM  Result Value Ref Range   Troponin I 0.20 (HH) <0.03 ng/mL    Comment: CRITICAL RESULT CALLED TO, READ BACK BY AND VERIFIED WITH MONICA MOON 05/02/2016 0911 SGD   Procalcitonin - Baseline     Status: None   Collection Time: 05/04/2016  8:28 AM  Result Value Ref Range   Procalcitonin 1.71 ng/mL    Comment:        Interpretation: PCT > 0.5 ng/mL and <= 2 ng/mL: Systemic infection (sepsis) is possible, but other conditions are known to elevate PCT as well. (NOTE)         ICU PCT Algorithm               Non ICU PCT Algorithm    ----------------------------     ------------------------------         PCT < 0.25 ng/mL                 PCT < 0.1 ng/mL     Stopping of antibiotics            Stopping of antibiotics       strongly encouraged.               strongly encouraged.    ----------------------------     ------------------------------       PCT level decrease by               PCT < 0.25 ng/mL       >= 80% from peak PCT       OR PCT 0.25 - 0.5 ng/mL          Stopping of antibiotics  encouraged.     Stopping of antibiotics           encouraged.    ----------------------------     ------------------------------       PCT level decrease by              PCT >= 0.25 ng/mL       < 80% from peak PCT        AND PCT >= 0.5 ng/mL             Continuing antibiotics                                               encouraged.       Continuing antibiotics            encouraged.    ----------------------------     ------------------------------     PCT level increase compared          PCT > 0.5 ng/mL         with peak PCT AND          PCT >= 0.5 ng/mL             Escalation of antibiotics                                          strongly encouraged.      Escalation of antibiotics        strongly encouraged.   Urinalysis, Complete w Microscopic     Status: Abnormal   Collection Time: 05/13/2016  2:35 PM  Result Value Ref Range   Color, Urine BROWN YELLOW   APPearance TURBID (A) CLEAR   Specific Gravity, Urine 1.040 (H) 1.005 - 1.030   pH  5.0 - 8.0    TEST NOT REPORTED DUE TO COLOR INTERFERENCE OF URINE PIGMENT   Glucose, UA (A) NEGATIVE mg/dL    TEST NOT REPORTED DUE TO COLOR INTERFERENCE OF URINE PIGMENT   Hgb urine dipstick (A) NEGATIVE    TEST NOT REPORTED DUE TO COLOR INTERFERENCE OF URINE PIGMENT   Bilirubin Urine (A) NEGATIVE    TEST NOT REPORTED DUE TO COLOR INTERFERENCE OF URINE PIGMENT   Ketones, ur (A) NEGATIVE mg/dL    TEST NOT REPORTED DUE TO COLOR INTERFERENCE OF URINE PIGMENT   Protein, ur (A) NEGATIVE mg/dL    TEST NOT REPORTED DUE TO COLOR INTERFERENCE OF URINE PIGMENT   Nitrite (A) NEGATIVE    TEST NOT REPORTED DUE TO COLOR INTERFERENCE OF URINE PIGMENT   Leukocytes, UA (A) NEGATIVE    TEST NOT REPORTED DUE TO COLOR INTERFERENCE OF URINE PIGMENT   RBC / HPF TOO NUMEROUS TO COUNT 0 - 5 RBC/hpf   WBC, UA TOO NUMEROUS TO COUNT 0 - 5 WBC/hpf   Bacteria, UA NONE SEEN NONE SEEN   Squamous Epithelial / LPF NONE SEEN NONE SEEN   WBC Clumps PRESENT     Dg Chest Port 1 View  Result Date: 05/18/2016 CLINICAL DATA:  Plaque Tardieu stools began yesterday common lower leg swelling, history of CHF EXAM: PORTABLE CHEST 1 VIEW COMPARISON:  Chest x-ray of 12/22/2015 and CT angiogram of the chest of the same date FINDINGS: There are now bilateral pleural effusions present with some basilar  volume loss. With cardiomegaly and somewhat indistinct perihilar vasculature these findings may indicate mild congestive heart failure. The bones are somewhat osteopenic. IMPRESSION: Probable CHF with cardiomegaly, small effusions, and minimal pulmonary vascular congestion. Electronically Signed   By: Ivar Drape M.D.   On: 05/05/2016 08:27    Review of Systems  Unable to perform ROS: Mental status change   Blood pressure (!) 88/34, pulse 97, temperature 97.6 F (36.4 C), temperature source Oral, resp. rate 18, weight 56.4 kg (124 lb 6.4 oz), SpO2 93 %. Physical Exam  Nursing note and vitals reviewed. Constitutional: She appears well-developed and well-nourished. She appears listless.  HENT:  Head: Normocephalic and atraumatic.  Eyes: Conjunctivae and EOM are normal. Pupils are equal, round, and reactive to light.  Neck: Normal range of motion. Neck supple.  Cardiovascular: Normal rate, S1 normal, S2 normal and normal pulses.  An irregularly irregular rhythm present.  Murmur heard.  Systolic murmur is present  Respiratory: Effort normal and breath sounds normal.  GI: Soft. Bowel sounds are normal.  Musculoskeletal: Normal range of motion.  Neurological: She appears listless. She is disoriented.  Skin: Skin is warm and dry.  Psychiatric: She has a normal mood and affect.    Assessment/Plan: Diastolic congestive heart failure Melena Altered mental status COPD Diarrhea Congestion Hypertension Former smoker Chronic renal insufficiency stage IV Borderline troponins Anemia Pulmonary hypertension . PLAN Agree with admission for further evaluation Continue to follow H&H's for GI. Recommend Protonix therapy for peptic ulcer disease GERD Consult GI for possible EGD colonoscopy Transfuse if hemoglobin drops to 7 Consider nephrology evaluation and follow-up for acute on chronic renal insufficiency Gentle fluid hydration Continue consider antibiotic therapy for  bronchitis Pulmonary hypertension by history continue  supplemental oxygen therapy Conservative cardiology input at this point [place] 05/20/2016, 4:25 PM

## 2016-05-01 NOTE — Progress Notes (Signed)
   [place] [facility] [place] [facility] Day: 0 days Jacqueline Boyd is a 75 y.o. female presenting with Melena .   Advance care planning discussed with patient  with additional Family at bedside. All questions in regards to overall condition and expected prognosis answered. The decision was made to continue current code status  CODE STATUS: full Time spent: 18 minutes  Patient states "I never really thought about it" she understands that she is ill and wishes to remain full code at this time

## 2016-05-01 NOTE — Progress Notes (Signed)
Pt. O2 sats 60s on room air. Pt. Placed on 2.5 LO2 sats 95%

## 2016-05-01 NOTE — ED Notes (Signed)
Pt with weeping blister to right lower leg, rewrapped with gauze.

## 2016-05-01 NOTE — ED Notes (Signed)
Notified lab of add on troponin.

## 2016-05-01 NOTE — H&P (Signed)
[place] at [place] NAME: Jacqueline Boyd    MR#:  [id number]  DATE OF BIRTH:  1941-05-16   DATE OF ADMISSION:  04/26/2016  PRIMARY CARE PHYSICIAN: Marinda Elk, MD   REQUESTING/REFERRING PHYSICIAN: [place]  CHIEF COMPLAINT:   Chief Complaint  Patient presents with  . Melena    HISTORY OF PRESENT ILLNESS:  Jacqueline Boyd  is a 75 y.o. female with a known history of Diastolic congestive heart failure, COPD not on oxygen who is presenting with diarrhea. She describes one day duration of diarrhea for episodes of black tarry stools approximately denies lightheadedness shortness of breath chest pain present Hospital further workup and evaluation where she is found to be fecal occult positive . She denies any NSAIDs  Over the last 2 weeks she has been having issues with lower extremity edema and "congestion" saw her PCP started on azithromycin twice without results started on Mucinex and "some antibiotic" without results she has been complaining of a nonproductive cough with lower extremity edema denies frank shortness of breath denies orthopnea or she does not sleep flat at baseline  PAST MEDICAL HISTORY:   Past Medical History:  Diagnosis Date  . CHF (congestive heart failure) ([place])   . COPD with emphysema ([place])   . Hypertension     PAST SURGICAL HISTORY:   Past Surgical History:  Procedure Laterality Date  . HIP SURGERY Right   . 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 Smoking status: Former Smoker    Quit date: 11/11/1990  . Smokeless tobacco: Never Used  . Alcohol use Yes    FAMILY HISTORY:   Family History  Problem Relation Age of Onset  . Hypertension Other     DRUG ALLERGIES:   Allergies  Allergen Reactions  . Codeine Other (See Comments)    Itching and "feels like worms crawling in body".  . Penicillins Rash    Has patient had a PCN reaction  causing immediate rash, facial/tongue/throat swelling, SOB or lightheadedness with hypotension: No  Has patient had a PCN reaction causing severe rash involving mucus membranes or skin necrosis: No Has patient had a PCN reaction that required hospitalization: No   Has patient had a PCN reaction occurring within the last 10 years: No  If all of the above answers are "NO", then may proceed with Cephalosporin use.   . Prednisone Palpitations    REVIEW OF SYSTEMS:  REVIEW OF SYSTEMS:  CONSTITUTIONAL: Denies fevers, chills,Positive fatigue, weakness.  EYES: Denies blurred vision, double vision, or eye pain.  EARS, NOSE, THROAT: Denies tinnitus, ear pain, hearing loss.  RESPIRATORY: Positive cough, denies shortness of breath, wheezing  CARDIOVASCULAR: Denies chest pain, palpitations, positive edema.  GASTROINTESTINAL: Denies nausea, vomiting, positive diarrhea, denies abdominal pain.  GENITOURINARY: Denies dysuria, hematuria.  ENDOCRINE: Denies nocturia or thyroid problems. HEMATOLOGIC AND LYMPHATIC: Denies easy bruising or bleeding.  SKIN: Denies rash or lesions.  MUSCULOSKELETAL: Denies pain in neck, back, shoulder, knees, hips, or further arthritic symptoms.  NEUROLOGIC: Denies paralysis, paresthesias.  PSYCHIATRIC: Denies anxiety or depressive symptoms. Otherwise full review of systems performed by me is negative.   MEDICATIONS AT HOME:   Prior to Admission medications   Medication Sig Start Date End Date Taking? Authorizing Provider  acetaminophen (TYLENOL) 500 MG tablet Take 1,000 mg by mouth every 8 (eight) hours as needed.   Yes  Historical Provider, MD  albuterol (PROVENTIL HFA;VENTOLIN HFA) 108 (90 Base) MCG/ACT inhaler Inhale 1-2 puffs into the lungs every 6 (six) hours as needed for wheezing or shortness of breath.   Yes Historical Provider, MD  allopurinol (ZYLOPRIM) 100 MG tablet Take 1 tablet (100 mg total) by mouth daily. 01/04/16  Yes Loletha Grayer, MD  ALPRAZolam Duanne Moron)  0.25 MG tablet Take 1 tablet (0.25 mg total) by mouth 3 (three) times daily as needed for anxiety. 01/04/16  Yes Loletha Grayer, MD  colchicine 0.6 MG tablet Take 1 tablet (0.6 mg total) by mouth 2 (two) times daily. 01/04/16  Yes Loletha Grayer, MD  cyclobenzaprine (FLEXERIL) 5 MG tablet Take 1 tablet (5 mg total) by mouth 3 (three) times daily as needed for muscle spasms. 01/04/16  Yes Loletha Grayer, MD  furosemide (LASIX) 20 MG tablet Take 1 tablet (20 mg total) by mouth daily as needed for fluid. Patient taking differently: Take 80 mg by mouth daily as needed for fluid.  01/04/16  Yes Loletha Grayer, MD  guaiFENesin (MUCINEX) 600 MG 12 hr tablet Take 600 mg by mouth 2 (two) times daily.   Yes Historical Provider, MD  magnesium hydroxide (MILK OF MAGNESIA) 400 MG/5ML suspension Take 30 mLs by mouth every 4 (four) hours as needed for mild constipation.   Yes Historical Provider, MD  mirtazapine (REMERON) 15 MG tablet Take 1 tablet (15 mg total) by mouth at bedtime. 01/04/16  Yes Richard Leslye Peer, MD  moxifloxacin (AVELOX) 400 MG tablet Take 1 tablet by mouth daily. 04/24/16 05/05/16 Yes Historical Provider, MD  Multiple Vitamin (MULTIVITAMIN WITH MINERALS) TABS tablet Take 1 tablet by mouth daily.   Yes Historical Provider, MD  oxyCODONE (OXY IR/ROXICODONE) 5 MG immediate release tablet Take 1 tablet (5 mg total) by mouth every 6 (six) hours as needed for moderate pain or breakthrough pain. 01/04/16  Yes Loletha Grayer, MD  doxycycline (VIBRA-TABS) 100 MG tablet Take 1 tablet (100 mg total) by mouth every 12 (twelve) hours. Patient not taking: Reported on 05/05/2016 01/04/16   Loletha Grayer, MD  feeding supplement, ENSURE ENLIVE, (ENSURE ENLIVE) LIQD Take 237 mLs by mouth 3 (three) times daily between meals. Patient not taking: Reported on 05/11/2016 01/04/16   Loletha Grayer, MD  polyethylene glycol [facility] / Floria Raveling) packet Take 17 g by mouth daily. Patient not taking: Reported on 05/17/2016  01/04/16   Loletha Grayer, MD      VITAL SIGNS:  Blood pressure (!) 115/58, pulse 84, temperature 97.6 F (36.4 C), temperature source Oral, resp. rate 19, SpO2 100 %.  PHYSICAL EXAMINATION:  VITAL SIGNS: Vitals:   05/22/2016 0830 05/05/2016 0939  BP: 111/71 (!) 115/58  Pulse: 84 84  Resp: 18 19  Temp:     GENERAL:75 y.o.female currently in no acute distress. Week/frail HEAD: Normocephalic, atraumatic.  EYES: Pupils equal, round, reactive to light. Extraocular muscles intact. No scleral icterus.  MOUTH: Moist mucosal membrane. Dentition intact. No abscess noted.  EAR, NOSE, THROAT: Clear without exudates. No external lesions.  NECK: Supple. No thyromegaly. No nodules. No JVD.  PULMONARY: Greatly diminished without wheeze rails or rhonci. No use of accessory muscles, Good respiratory effort. good air entry bilaterally CHEST: Nontender to palpation.  CARDIOVASCULAR: S1 and S2. Regular rate and rhythm. No murmurs, rubs, or gallops. 2 +edema. Pedal pulses 2+ bilaterally.  GASTROINTESTINAL: Soft, nontender, nondistended. No masses. Positive bowel sounds. No hepatosplenomegaly.  MUSCULOSKELETAL: No swelling, clubbing, or edema. Range of motion full in all extremities.  NEUROLOGIC: Cranial  nerves II through XII are intact. No gross focal neurological deficits. Sensation intact. Reflexes intact.  SKIN: Bilateral lower extremity edema with erythema and skin ulceration was No ulceration, lesions, rashes, or cyanosis. Skin warm and dry. Turgor intact.  PSYCHIATRIC: Mood, affect within normal limits. The patient is awake, alert and oriented x 3. Insight, judgment intact.    LABORATORY PANEL:   CBC  Recent Labs Lab 05/25/2016 0751  WBC 12.1*  HGB 10.6*  HCT 33.8*  PLT 247   ------------------------------------------------------------------------------------------------------------------  Chemistries   Recent Labs Lab 05/10/2016 0751  NA 137  K 5.0  CL 98*  CO2 27  GLUCOSE 88  BUN  67*  CREATININE 2.78*  CALCIUM 8.0*  AST 34  ALT 29  ALKPHOS 170*  BILITOT 0.5   ------------------------------------------------------------------------------------------------------------------  Cardiac Enzymes  Recent Labs Lab 05/08/2016 0751  TROPONINI 0.20*   ------------------------------------------------------------------------------------------------------------------  RADIOLOGY:  Dg Chest Port 1 View  Result Date: 05/04/2016 CLINICAL DATA:  Plaque Tardieu stools began yesterday common lower leg swelling, history of CHF EXAM: PORTABLE CHEST 1 VIEW COMPARISON:  Chest x-ray of 12/22/2015 and CT angiogram of the chest of the same date FINDINGS: There are now bilateral pleural effusions present with some basilar volume loss. With cardiomegaly and somewhat indistinct perihilar vasculature these findings may indicate mild congestive heart failure. The bones are somewhat osteopenic. IMPRESSION: Probable CHF with cardiomegaly, small effusions, and minimal pulmonary vascular congestion. Electronically Signed   By: Ivar Drape M.D.   On: 05/13/2016 08:27    EKG:   Orders placed or performed during the hospital encounter of 04/29/2016  . EKG 12-Lead  . EKG 12-Lead    IMPRESSION AND PLAN:   75 year old Caucasian female history of diastolic congestive heart failure who is presenting with melena as well as CHF symptoms  1. Upper GI bleed: Hemoglobin stable, place on Protonix trend hemoglobin and transfuse if less than 7, consult gastroenterology 2. Acute on chronic diastolic congestive heart failure: Provide IV diuresis however cautious with renal function, check echocardiogram provide breathing treatments as required, consult cardiology 3. Acute renal failure: Consult nephrology to continue with diuresis as above 4. Miscellaneous: We'll check pro-calcitonin to rule out infectious cause however I feel infection is unlikely    All the records are reviewed and case discussed with ED  provider. Management plans discussed with the patient, family and they are in agreement.  CODE STATUS: Full  TOTAL TIME TAKING CARE OF THIS PATIENT: 45 minutes.    Hower,  Karenann Cai.D on 05/05/2016 at 9:48 AM  Between 7am to 6pm - Pager - [phone]  After 6pm: House Pager: - [phone]  [facility]  Office  [phone]  CC: Primary care physician; Marinda Elk, MD

## 2016-05-01 NOTE — ED Triage Notes (Signed)
Per ems pt from home with c/o black tarry stools started yesterday. bp lying per ems was 96/42, upright bp was 63/30. Pt denies any pain on arrival to ED. Bilateral lower leg swelling with edema noted. Pt currently takes lasix for CHF. NAD, skin warm and dry.

## 2016-05-01 NOTE — Progress Notes (Signed)
Pt. Found unresponsive  To verbal stimuli BP 129/69, HR 80, Respiration 10, O2 Sats 80% 2.5L. Increased to 4L via RT, Rapid Response called. Dr. Manuella Ghazi notified Narcan given x1 pt. Transferred to ICU. Son Corene Cornea made aware of pt. condition

## 2016-05-01 NOTE — ED Notes (Signed)
Oxygen document earlier was in error. Room air oxygen is currently 97%. Pt with no resp distress noted, speaking in complete sentences.

## 2016-05-01 NOTE — Progress Notes (Signed)
   Spoke with the patient's son(Jaison Stacy) regarding the code status.  Family has decided to make the patient DNR at this time.   [facility]

## 2016-05-01 NOTE — ED Provider Notes (Signed)
[facility] Emergency Department Provider Note  ____________________________________________   I have reviewed the triage vital signs and the nursing notes.   HISTORY  Chief Complaint Melena    HPI Jacqueline Boyd is a 75 y.o. female presents complaining of melanotic black stool since yesterday morning. Loose. Denies any fever or chills abdominal pain chest pain. She states she does have chronic lower extremity edema which is been getting worse over the last several weeks to months, she also states that she has had "congestion" in her lungs and been treated with 2 different Z packs" and another antibiotic" without any significant resolution of symptoms. She does not have a productive cough. She does have some mild orthopnea. Apparently, no chest x-ray has been obtained during the course of this treatment. Patient states is not on any blood thinners or aspirin.     Past Medical History:  Diagnosis Date  . CHF (congestive heart failure) ([place])   . COPD with emphysema ([place])   . Hypertension     Patient Active Problem List   Diagnosis Date Noted  . Infectious tenosynovitis of wrist extensor 01/01/2016  . Protein-calorie malnutrition, severe 12/11/2015  . Pressure ulcer 12/10/2015  . Hyponatremia 12/09/2015  . Chronic diastolic heart failure ([place]) 11/08/2015  . Pulmonary hypertension 11/08/2015  . Degenerative arthritis of hip 11/06/2015  . 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 Degeneration of intervertebral disc of lumbar region 05/09/2015  . Neuritis or radiculitis due to rupture of lumbar intervertebral disc 05/09/2015  . LBP (low back pain) 12/27/2014  . Coxitis 02/03/2014  . Chronic obstructive pulmonary disease ([place]) 05/04/2012  . Essential (primary) hypertension 05/04/2012    Past Surgical History:  Procedure Laterality Date  . HIP SURGERY Right   . TUMOR REMOVAL     as a baby; chest    Prior to Admission medications   Medication Sig Start Date  End Date Taking? Authorizing Provider  acetaminophen (TYLENOL) 500 MG tablet Take 1,000 mg by mouth every 8 (eight) hours as needed.    Historical Provider, MD  albuterol (PROVENTIL HFA;VENTOLIN HFA) 108 (90 Base) MCG/ACT inhaler Inhale 1-2 puffs into the lungs every 6 (six) hours as needed for wheezing or shortness of breath.    Historical Provider, MD  allopurinol (ZYLOPRIM) 100 MG tablet Take 1 tablet (100 mg total) by mouth daily. 01/04/16   Loletha Grayer, MD  ALPRAZolam Duanne Moron) 0.25 MG tablet Take 1 tablet (0.25 mg total) by mouth 3 (three) times daily as needed for anxiety. 01/04/16   Loletha Grayer, MD  B Complex-C (B-COMPLEX WITH VITAMIN C) tablet Take 1 tablet by mouth daily.    Historical Provider, MD  colchicine 0.6 MG tablet Take 1 tablet (0.6 mg total) by mouth 2 (two) times daily. 01/04/16   Loletha Grayer, MD  cyclobenzaprine (FLEXERIL) 5 MG tablet Take 1 tablet (5 mg total) by mouth 3 (three) times daily as needed for muscle spasms. 01/04/16   Loletha Grayer, MD  doxycycline (VIBRA-TABS) 100 MG tablet Take 1 tablet (100 mg total) by mouth every 12 (twelve) hours. 01/04/16   Loletha Grayer, MD  feeding supplement, ENSURE ENLIVE, (ENSURE ENLIVE) LIQD Take 237 mLs by mouth 3 (three) times daily between meals. 01/04/16   Loletha Grayer, MD  furosemide (LASIX) 20 MG tablet Take 1 tablet (20 mg total) by mouth daily as needed for fluid. 01/04/16   Loletha Grayer, MD  magnesium hydroxide (MILK OF MAGNESIA) 400 MG/5ML suspension Take 30 mLs by mouth every 4 (four) hours as  needed for mild constipation.    Historical Provider, MD  mirtazapine (REMERON) 15 MG tablet Take 1 tablet (15 mg total) by mouth at bedtime. 01/04/16   Loletha Grayer, MD  Multiple Vitamin (MULTIVITAMIN WITH MINERALS) TABS tablet Take 1 tablet by mouth daily.    Historical Provider, MD  oxyCODONE (OXY IR/ROXICODONE) 5 MG immediate release tablet Take 1 tablet (5 mg total) by mouth every 6 (six) hours as needed for moderate  pain or breakthrough pain. 01/04/16   Loletha Grayer, MD  polyethylene glycol [facility] / GLYCOLAX) packet Take 17 g by mouth daily. 01/04/16   Loletha Grayer, MD    Allergies Codeine; Penicillins; and Prednisone  Family History  Problem Relation Age of Onset  . Hypertension Other     Social History Social History  Substance Use Topics  . Smoking status: Former Smoker    Quit date: 11/11/1990  . Smokeless tobacco: Never Used  . Alcohol use Yes    Review of Systems Constitutional: No fever/chills Eyes: No visual changes. ENT: No sore throat. No stiff neck no neck pain Cardiovascular: Denies chest pain. Respiratory: Positive chronic shortness of breath. Gastrointestinal:   no vomiting.  Positive dark loose stools.  No constipation. Genitourinary: Negative for dysuria. Musculoskeletal: Positive lower extremity swelling Skin: Negative for rash. Neurological: Negative for severe headaches, focal weakness or numbness. 10-point ROS otherwise negative.  ____________________________________________   PHYSICAL EXAM:  VITAL SIGNS: ED Triage Vitals [05/05/2016 0747]  Enc Vitals Group     BP (!) 115/51     Pulse Rate 81     Resp 19     Temp 97.6 F (36.4 C)     Temp Source Oral     SpO2 100 %     Weight      Height      Head Circumference      Peak Flow      Pain Score      Pain Loc      Pain Edu?      Excl. in [place]?     Constitutional: Alert and oriented. Well appearing and in no acute distress. Eyes: Conjunctivae are normal. PERRL. EOMI. Head: Atraumatic. Nose: No congestion/rhinnorhea. Mouth/Throat: Mucous membranes are moist.  Oropharynx non-erythematous. Neck: No stridor.   Nontender with no meningismus Cardiovascular: Normal rate, regular rhythm. Grossly normal heart sounds.  Good peripheral circulation. Respiratory: Normal respiratory effort.  No retractions. Diminished in the bases with occasional rail  Abdominal: Soft and nontender. No distention. No guarding  no rebound Back:  There is no focal tenderness or step off.  there is no midline tenderness there are no lesions noted. there is no CVA tenderness Rectal exam: Lovett Calender guaiac positive stool Musculoskeletal: No lower extremity tenderness, no upper extremity tenderness. No joint effusions, no DVT signs strong distal pulses significant 2-3+ bilateral weeping pitting edema edema Neurologic:  Normal speech and language. No gross focal neurologic deficits are appreciated.  Skin:  Skin is warm, dry and intact. Decubitus was noted Psychiatric: Mood and affect are normal. Speech and behavior are normal.  ____________________________________________   LABS (all labs ordered are listed, but only abnormal results are displayed)  Labs Reviewed  COMPREHENSIVE METABOLIC PANEL - Abnormal; Notable for the following:       Result Value   Chloride 98 (*)    BUN 67 (*)    Creatinine, Ser 2.78 (*)    Calcium 8.0 (*)    Total Protein 6.3 (*)    Albumin 3.0 (*)  Alkaline Phosphatase 170 (*)    GFR calc non Af Amer 15 (*)    GFR calc Af Amer 18 (*)    All other components within normal limits  CBC WITH DIFFERENTIAL/PLATELET - Abnormal; Notable for the following:    WBC 12.1 (*)    Hemoglobin 10.6 (*)    HCT 33.8 (*)    MCHC 31.3 (*)    RDW 17.5 (*)    Neutro Abs 11.1 (*)    Lymphs Abs 0.4 (*)    All other components within normal limits  PROTIME-INR  APTT  BRAIN NATRIURETIC PEPTIDE  URINALYSIS, COMPLETE (UACMP) WITH MICROSCOPIC  TROPONIN I  TYPE AND SCREEN   ____________________________________________  EKG  I personally interpreted any EKGs ordered by me or triage Ratio with ____________________________________________  RADIOLOGY  I reviewed any imaging ordered by me or triage that were performed during my shift and, if possible, patient and/or family made aware of any abnormal findings. ____________________________________________   PROCEDURES  Procedure(s) performed:  None  Procedures  Critical Care performed: CRITICAL CARE Performed by: Schuyler Amor   Total critical care time: 45 minutes  Critical care time was exclusive of separately billable procedures and treating other patients.  Critical care was necessary to treat or prevent imminent or life-threatening deterioration.  Critical care was time spent personally by me on the following activities: development of treatment plan with patient and/or surrogate as well as nursing, discussions with consultants, evaluation of patient's response to treatment, examination of patient, obtaining history from patient or surrogate, ordering and performing treatments and interventions, ordering and review of laboratory studies, ordering and review of radiographic studies, pulse oximetry and re-evaluation of patient's condition.   ____________________________________________   INITIAL IMPRESSION / ASSESSMENT AND PLAN / ED COURSE  Pertinent labs & imaging results that were available during my care of the patient were reviewed by me and considered in my medical decision making (see chart for details). Patient here with multiple different issues. She has acute renal insufficiency, unknown cause, she has significant evidence of third spacing including in her lungs which is likely the cause of her "congestion" which has been refractory despite 2 multiple different antibiotic courses. She is complaining of melena. She does not currently at this time have melena however she does have they guaiac positive stool.  She will need to be admitted for all of these. Dr. Posey Pronto aware and agrees w mgt.  Clinical Course    ____________________________________________   FINAL CLINICAL IMPRESSION(S) / ED DIAGNOSES  Final diagnoses:  Cough      This chart was dictated using voice recognition software.  Despite best efforts to proofread,  errors can occur which can change meaning.      Schuyler Amor, MD 04/27/2016  [phone]

## 2016-05-01 NOTE — Progress Notes (Signed)
Dr. Lavetta Nielsen Made aware of pt. Being unable to urinate since arrival to hospital orders for bladder scan and In and out cath for urinalysis.

## 2016-05-01 NOTE — ED Notes (Signed)
Dr Burlene Arnt notified verbally in person that pt with troponin of 0.20 per lab. edp acknowledged.

## 2016-05-01 NOTE — Consult Note (Signed)
[place] Nurse wound consult note Reason for Consult: Ruptured serum filled blister to right anterior pretibial leg.  Duration one day.  Does not wear compression at home.  Wound type:Trauma Pressure Ulcer POA: N/A Measurement: 2 cm x 1 cm x 0.1 cm nonapproximated skin flap present.  Wound [id number] and moist Drainage (amount, consistency, odor) Minimal serosanguinous  No odor.  Periwound:Intact.   Dressing procedure/placement/frequency: Cleanse right lower leg with NS and pat gently dry.  Apply silicone border foam dressing.  Change every three days and PRN soilage.  Will not follow at this time.  Please re-consult if needed.  Domenic Moras RN BSN [place] Pager [phone]

## 2016-05-01 NOTE — Progress Notes (Signed)
eLink Physician-Brief Progress Note Patient Name: Vanisha Sholl DOB: 1940/08/29 MRN: [id number]   Date of Service  05/05/2016  HPI/Events of Note  New patient evaluation  eICU Interventions  Nothing further to add.     Intervention Category Major Interventions: Other:  Hortensia Duffin 05/16/2016, 7:03 PM

## 2016-05-02 ENCOUNTER — Inpatient Hospital Stay (HOSPITAL_COMMUNITY)
Admit: 2016-05-02 | Discharge: 2016-05-02 | Disposition: A | Discharge status: Home / Self Care | Payer: Commercial Managed Care - HMO | Attending: Internal Medicine | Admitting: Internal Medicine

## 2016-05-02 DIAGNOSIS — I509 Heart failure, unspecified: Secondary | ICD-10-CM

## 2016-05-02 LAB — MAGNESIUM
MAGNESIUM: 2.4 mg/dL (ref 1.7–2.4)
MAGNESIUM: 2.3 mg/dL (ref 1.7–2.4)

## 2016-05-02 LAB — BASIC METABOLIC PANEL
ANION GAP: 10 (ref 5–15)
BUN: 73 mg/dL — ABNORMAL HIGH (ref 6–20)
CALCIUM: 7.4 mg/dL — AB (ref 8.9–10.3)
CO2: 26 mmol/L (ref 22–32)
Chloride: 101 mmol/L (ref 101–111)
Creatinine, Ser: 2.85 mg/dL — ABNORMAL HIGH (ref 0.44–1.00)
GFR calc non Af Amer: 15 mL/min — ABNORMAL LOW (ref >60)
GFR, EST AFRICAN AMERICAN: 17 mL/min — AB (ref >60)
Glucose, Bld: 87 mg/dL (ref 65–99)
POTASSIUM: 5.1 mmol/L (ref 3.5–5.1)
Sodium: 137 mmol/L (ref 135–145)

## 2016-05-02 LAB — CBC
HEMATOCRIT: 32.7 % — AB (ref 35.0–47.0)
HEMOGLOBIN: 10.2 g/dL — AB (ref 12.0–16.0)
MCH: 25.4 pg — ABNORMAL LOW (ref 26.0–34.0)
MCHC: 31.1 g/dL — ABNORMAL LOW (ref 32.0–36.0)
MCV: 81.9 fL (ref 80.0–100.0)
Platelets: 239 10*3/uL (ref 150–440)
RBC: 3.99 MIL/uL (ref 3.80–5.20)
RDW: 17.2 % — AB (ref 11.5–14.5)
WBC: 11.2 10*3/uL — AB (ref 3.6–11.0)

## 2016-05-02 LAB — C DIFFICILE QUICK SCREEN W PCR REFLEX
C Diff antigen: POSITIVE — AB
C Diff interpretation: DETECTED
C Diff toxin: POSITIVE — AB

## 2016-05-02 LAB — GLUCOSE, CAPILLARY
GLUCOSE-CAPILLARY: 62 mg/dL — AB (ref 65–99)
GLUCOSE-CAPILLARY: 67 mg/dL (ref 65–99)
GLUCOSE-CAPILLARY: 78 mg/dL (ref 65–99)
GLUCOSE-CAPILLARY: 111 mg/dL — AB (ref 65–99)
Glucose-Capillary: 106 mg/dL — ABNORMAL HIGH (ref 65–99)

## 2016-05-02 LAB — ECHOCARDIOGRAM COMPLETE
Height: 65 in
Weight: 1985.9 oz

## 2016-05-02 LAB — PHOSPHORUS
Phosphorus: 4.9 mg/dL — ABNORMAL HIGH (ref 2.5–4.6)
Phosphorus: 4.7 mg/dL — ABNORMAL HIGH (ref 2.5–4.6)

## 2016-05-02 LAB — MRSA PCR SCREENING: MRSA BY PCR: NEGATIVE

## 2016-05-02 MED ORDER — ALPRAZOLAM 0.25 MG PO TABS
0.2500 mg | ORAL_TABLET | Freq: Three times a day (TID) | ORAL | Status: DC | PRN
  Administered 2016-05-02 – 2016-05-04 (×2): 0.25 mg via ORAL
  Filled 2016-05-02 (×3): qty 1

## 2016-05-02 MED ORDER — VITAL HIGH PROTEIN PO LIQD
1000.0000 mL | ORAL | Status: DC
  Administered 2016-05-02: 30 mL

## 2016-05-02 MED ORDER — DIPHENHYDRAMINE HCL 50 MG/ML IJ SOLN
25.0000 mg | Freq: Four times a day (QID) | INTRAMUSCULAR | Status: DC | PRN
  Administered 2016-05-02: 25 mg via INTRAVENOUS
  Filled 2016-05-02: qty 1

## 2016-05-02 MED ORDER — DEXTROSE 50 % IV SOLN
INTRAVENOUS | Status: AC
  Administered 2016-05-02: 25 mL
  Filled 2016-05-02: qty 50

## 2016-05-02 MED ORDER — FENTANYL BOLUS VIA INFUSION
25.0000 ug | INTRAVENOUS | Status: DC | PRN
  Filled 2016-05-02: qty 50

## 2016-05-02 MED ORDER — CHLORHEXIDINE GLUCONATE 0.12% ORAL RINSE (MEDLINE KIT)
15.0000 mL | Freq: Two times a day (BID) | OROMUCOSAL | Status: DC
  Administered 2016-05-02 – 2016-05-03 (×4): 15 mL via OROMUCOSAL

## 2016-05-02 MED ORDER — ORAL CARE MOUTH RINSE
15.0000 mL | Freq: Four times a day (QID) | OROMUCOSAL | Status: DC
  Administered 2016-05-02 (×3): 15 mL via OROMUCOSAL
  Administered 2016-05-02: 12:00:00 via OROMUCOSAL
  Administered 2016-05-03 (×2): 15 mL via OROMUCOSAL

## 2016-05-02 MED ORDER — VANCOMYCIN 50 MG/ML ORAL SOLUTION
125.0000 mg | Freq: Four times a day (QID) | ORAL | Status: DC
  Administered 2016-05-02 – 2016-05-03 (×3): 125 mg
  Filled 2016-05-02 (×5): qty 2.5

## 2016-05-02 NOTE — Progress Notes (Signed)
[place] at [place] NAME: Norine Skorupski    MRN#:  [id number]  DATE OF BIRTH:  [birth year]  SUBJECTIVE:  Hospital Day: 1 day Meriyah Renaldo is a 75 y.o. female presenting with Melena .   Overnight events: Respiratory status declined, intubated over the night Interval Events: On ventilator  REVIEW OF SYSTEMS:  Unable to obtain given mental status medical condition  DRUG ALLERGIES:   Allergies  Allergen Reactions  . Codeine Other (See Comments)    Itching and "feels like worms crawling in body".  . Penicillins Rash    Has patient had a PCN reaction causing immediate rash, facial/tongue/throat swelling, SOB or lightheadedness with hypotension: No  Has patient had a PCN reaction causing severe rash involving mucus membranes or skin necrosis: No Has patient had a PCN reaction that required hospitalization: No   Has patient had a PCN reaction occurring within the last 10 years: No  If all of the above answers are "NO", then may proceed with Cephalosporin use.   . Prednisone Palpitations    VITALS:  Blood pressure (!) 91/41, pulse 97, temperature 97.8 F (36.6 C), resp. rate 10, height 5\' 5"  (1.651 m), weight 56.3 kg (124 lb 1.9 oz), SpO2 97 %.  PHYSICAL EXAMINATION:   VITAL SIGNS: Vitals:   05/02/16 1200 05/02/16 1300  BP: (!) 96/40 (!) 91/41  Pulse: 99 97  Resp: 10 10  Temp:     GENERAL:75 y.o.female critically ill, intubated HEAD: Normocephalic, atraumatic.  EYES: Pupils equal, round, reactive to light. Unable to assess extraocular muscles given mental status/medical condition. No scleral icterus.  MOUTH: Moist mucosal membrane. Dentition intact. No abscess noted.  EAR, NOSE, THROAT: Clear without exudates. No external lesions.  NECK: Supple. No thyromegaly. No nodules. No JVD.  PULMONARY: Coarse breath sounds without wheeze No use of accessory muscles, Good respiratory effort. good air entry bilaterally CHEST:  Nontender to palpation.  CARDIOVASCULAR: S1 and S2. Regular rate and rhythm. No murmurs, rubs, or gallops. 2+ edema. Pedal pulses 2+ bilaterally.  GASTROINTESTINAL: Soft, nontender, nondistended. No masses. Positive bowel sounds. No hepatosplenomegaly.  MUSCULOSKELETAL: No swelling, clubbing, or edema. Range of motion full in all extremities.  NEUROLOGIC: Unable to assess given mental status/medical condition SKIN: No ulceration, lesions, rashes, or cyanosis. Skin warm and dry. Turgor intact.  PSYCHIATRIC: Unable to assess given mental status/medical condition       LABORATORY PANEL:   CBC  Recent Labs Lab 05/02/16 0432  WBC 11.2*  HGB 10.2*  HCT 32.7*  PLT 239   ------------------------------------------------------------------------------------------------------------------  Chemistries   Recent Labs Lab 05/24/2016 0751 05/02/16 0432  NA 137 137  K 5.0 5.1  CL 98* 101  CO2 27 26  GLUCOSE 88 87  BUN 67* 73*  CREATININE 2.78* 2.85*  CALCIUM 8.0* 7.4*  AST 34  --   ALT 29  --   ALKPHOS 170*  --   BILITOT 0.5  --    ------------------------------------------------------------------------------------------------------------------  Cardiac Enzymes  Recent Labs Lab 05/20/2016 0751  TROPONINI 0.20*   ------------------------------------------------------------------------------------------------------------------  RADIOLOGY:  Dg Abd 1 View  Result Date: 05/18/2016 CLINICAL DATA:  OG tube placement. EXAM: ABDOMEN - 1 VIEW COMPARISON:  None. FINDINGS: Tip and side port of the enteric tube below the diaphragm in the stomach. Bilateral pleural effusions, right greater than left. No evidence of free air. Nonspecific bowel gas pattern in the visualized upper abdomen. IMPRESSION: Tip and side port of the enteric tube below the  diaphragm in the stomach. Electronically Signed   By: Jeb Levering M.D.   On: 05/20/2016 23:24   Dg Chest Port 1 View  Result Date:  05/09/2016 CLINICAL DATA:  Pl[place] stools began yesterday common lower leg swelling, history of CHF EXAM: PORTABLE CHEST 1 VIEW COMPARISON:  Chest x-ray of 12/22/2015 and CT angiogram of the chest of the same date FINDINGS: There are now bilateral pleural effusions present with some basilar volume loss. With cardiomegaly and somewhat indistinct perihilar vasculature these findings may indicate mild congestive heart failure. The bones are somewhat osteopenic. IMPRESSION: Probable CHF with cardiomegaly, small effusions, and minimal pulmonary vascular congestion. Electronically Signed   By: Ivar Drape M.D.   On: 05/15/2016 08:27    EKG:   Orders placed or performed during the hospital encounter of 05/02/2016  . EKG 12-Lead  . EKG 12-Lead    ASSESSMENT AND PLAN:   Lowana Monier is a 75 y.o. female presenting with Melena . Admitted 05/07/2016 : Day #: 1 day 1. Upper GI bleed: Hemoglobin stable with Protonix GI input appreciated 2. Acute on chronic diastolic congestive heart failure: Cardiology input appreciated 3. Acute renal failure: Careful diuresis nephrology appreciated 4. Acute on chronic respiratory failure with hypoxia: Continue ventilator support wean PEEP and oxygen as tolerated-critical care appreciated  All the records are reviewed and case discussed with Care Management/Social Workerr. Management plans discussed with the patient, family and they are in agreement.  CODE STATUS: Partial, no CPR TOTAL TIME TAKING CARE OF THIS PATIENT: 28 minutes.   POSSIBLE D/C IN 2-3DAYS, DEPENDING ON CLINICAL CONDITION.   Hower,  Karenann Cai.D on 05/02/2016 at 2:07 PM  Between 7am to 6pm - Pager - [phone]  After 6pm: House Pager: - [facility] [facility]  Office  [phone]  CC: Primary care physician; Marinda Elk, MD

## 2016-05-02 NOTE — Progress Notes (Signed)
*  PRELIMINARY RESULTS* Echocardiogram 2D Echocardiogram has been performed.  Sherrie Sport 05/02/2016, 9:47 AM

## 2016-05-02 NOTE — Consult Note (Signed)
[facility]  CONSULT NOTE    Date: 05/02/2016                  Patient Name:  Jacqueline Boyd  MRN: [id number]  DOB: 05-24-1941  Age / Sex: 75 y.o., female         PCP: Marinda Elk, MD                 Service Requesting Consult: Dr. Isidore Moos                 Reason for Consult: Acute Renal Failure            History of Present Illness: [name] is a 75 y.o. white female with COPD, CHF, hypertension, gout, who was admitted to [facility] on 05/03/2016 for Cough [R05] Lower GI bleeding [K92.2] Acute renal injury ([place]) [N17.9] Congestive heart failure, unspecified congestive heart failure chronicity, unspecified congestive heart failure type ([place]) [I50.9]   Patient's creatinine was 0.88 in August. Elevated on admission. Started on IV furosemide '20mg'$  q12.  Intubated and sedated. Son at bedside.     Medications: Outpatient medications: Prescriptions Prior to Admission  Medication Sig Dispense Refill Last Dose  . acetaminophen (TYLENOL) 500 MG tablet Take 1,000 mg by mouth every 8 (eight) hours as needed.   prn at prn  . albuterol (PROVENTIL HFA;VENTOLIN HFA) 108 (90 Base) MCG/ACT inhaler Inhale 1-2 puffs into the lungs every 6 (six) hours as needed for wheezing or shortness of breath.   prn at prn  . allopurinol (ZYLOPRIM) 100 MG tablet Take 1 tablet (100 mg total) by mouth daily. 30 tablet 0 04/30/2016 at 0800  . ALPRAZolam (XANAX) 0.25 MG tablet Take 1 tablet (0.25 mg total) by mouth 3 (three) times daily as needed for anxiety. 15 tablet 0 prn at prn  . colchicine 0.6 MG tablet Take 1 tablet (0.6 mg total) by mouth 2 (two) times daily. 60 tablet 0 prn at prn  . cyclobenzaprine (FLEXERIL) 5 MG tablet Take 1 tablet (5 mg total) by mouth 3 (three) times daily as needed for muscle spasms. 90 tablet 0 prn at prn  . furosemide (LASIX) 20 MG tablet Take 1 tablet (20 mg total) by mouth daily as needed for fluid. (Patient taking differently: Take 80 mg by  mouth daily as needed for fluid. ) 30 tablet 0 04/30/2016 at 0800  . guaiFENesin (MUCINEX) 600 MG 12 hr tablet Take 600 mg by mouth 2 (two) times daily.   04/30/2016 at 0800  . magnesium hydroxide (MILK OF MAGNESIA) 400 MG/5ML suspension Take 30 mLs by mouth every 4 (four) hours as needed for mild constipation.   prn at prn  . mirtazapine (REMERON) 15 MG tablet Take 1 tablet (15 mg total) by mouth at bedtime. 30 tablet 0 04/29/2016 at pm  . moxifloxacin (AVELOX) 400 MG tablet Take 1 tablet by mouth daily.   04/30/2016 at 0800  . Multiple Vitamin (MULTIVITAMIN WITH MINERALS) TABS tablet Take 1 tablet by mouth daily.   04/30/2016 at 0800  . oxyCODONE (OXY IR/ROXICODONE) 5 MG immediate release tablet Take 1 tablet (5 mg total) by mouth every 6 (six) hours as needed for moderate pain or breakthrough pain. 15 tablet 0 prn at prn  . doxycycline (VIBRA-TABS) 100 MG tablet Take 1 tablet (100 mg total) by mouth every 12 (twelve) hours. (Patient not taking: Reported on 05/04/2016) 8 tablet 8 Completed Course at Unknown time  . feeding supplement, ENSURE ENLIVE, (ENSURE  ENLIVE) LIQD Take 237 mLs by mouth 3 (three) times daily between meals. (Patient not taking: Reported on 05/25/2016) 90 Bottle 0 Not Taking at Unknown time  . polyethylene glycol (MIRALAX / GLYCOLAX) packet Take 17 g by mouth daily. (Patient not taking: Reported on 05/10/2016) 30 each 0 Not Taking at prn    Current medications: Current Facility-Administered Medications  Medication Dose Route Frequency Provider Last Rate Last Dose  . acetaminophen (TYLENOL) tablet 650 mg  650 mg Oral Q6H PRN Lytle Butte, MD       Or  . acetaminophen (TYLENOL) suppository 650 mg  650 mg Rectal Q6H PRN Lytle Butte, MD      . allopurinol (ZYLOPRIM) tablet 100 mg  100 mg Oral Daily Lytle Butte, MD   100 mg at 05/23/2016 1444  . ALPRAZolam (XANAX) tablet 0.25 mg  0.25 mg Oral TID PRN Lytle Butte, MD      . azithromycin [facility]) tablet 500 mg  500 mg Oral Daily  Bincy S Varughese, NP      . chlorhexidine gluconate (MEDLINE KIT) (PERIDEX) 0.12 % solution 15 mL  15 mL Mouth Rinse BID Bincy S Varughese, NP   15 mL at 05/02/16 0733  . cyclobenzaprine (FLEXERIL) tablet 5 mg  5 mg Oral TID PRN Lytle Butte, MD      . diphenhydrAMINE (BENADRYL) injection 25 mg  25 mg Intravenous Q6H PRN Laverle Hobby, MD   25 mg at 05/02/16 0759  . fentaNYL (SUBLIMAZE) bolus via infusion 25 mcg  25 mcg Intravenous Q1H PRN Awilda Bill, NP      . fentaNYL (SUBLIMAZE) injection 50 mcg  50 mcg Intravenous Once Awilda Bill, NP      . fentaNYL 2530mg in NS 2566m(1034mml) infusion-PREMIX  25-400 mcg/hr Intravenous Continuous DanAwilda BillP 4 mL/hr at 05/18/2016 2230 40 mcg/hr at 05/26/2016 2230  . furosemide (LASIX) injection 20 mg  20 mg Intravenous BID DavLytle ButteD   20 mg at 05/02/16 0732  . ipratropium-albuterol (DUONEB) 0.5-2.5 (3) MG/3ML nebulizer solution 3 mL  3 mL Nebulization Q4H PRN Bincy S Varughese, NP      . MEDLINE mouth rinse  15 mL Mouth Rinse QID Bincy S Varughese, NP   15 mL at 05/02/16 0348  . mirtazapine (REMERON) tablet 15 mg  15 mg Oral QHS DavLytle ButteD   15 mg at 05/15/2016 2334  . multivitamin with minerals tablet 1 tablet  1 tablet Oral Daily DavLytle ButteD   1 tablet at 04/26/2016 1444  . ondansetron (ZOFRAN) tablet 4 mg  4 mg Oral Q6H PRN DavLytle ButteD       Or  . ondansetron (ZOChildren[facility]S Institute [facility], Thenjection 4 mg  4 mg Intravenous Q6H PRN DavLytle ButteD   4 mg at 05/25/2016 1111  . oxyCODONE (Oxy IR/ROXICODONE) immediate release tablet 5 mg  5 mg Oral Q6H PRN DavLytle ButteD   5 mg at 04/29/2016 1447  . pantoprazole (PROTONIX) injection 40 mg  40 mg Intravenous Q12H DavLytle ButteD   40 mg at 05/22/2016 2335  . phenylephrine (NEO-SYNEPHRINE) 10 mg in dextrose 5 % 250 mL (0.04 mg/mL) infusion  0-400 mcg/min Intravenous Titrated DanAwilda BillP   Stopped at 05/26/2016 2055  . sodium chloride flush (NS) 0.9 % injection 3 mL  3 mL  Intravenous Q12H DavLytle ButteD   3 mL at 05/03/2016 2200  Allergies: Allergies  Allergen Reactions  . Codeine Other (See Comments)    Itching and "feels like worms crawling in body".  . Penicillins Rash    Has patient had a PCN reaction causing immediate rash, facial/tongue/throat swelling, SOB or lightheadedness with hypotension: No  Has patient had a PCN reaction causing severe rash involving mucus membranes or skin necrosis: No Has patient had a PCN reaction that required hospitalization: No   Has patient had a PCN reaction occurring within the last 10 years: No  If all of the above answers are "NO", then may proceed with Cephalosporin use.   . Prednisone Palpitations      Past Medical History: Past Medical History:  Diagnosis Date  . CHF (congestive heart failure) ([place])   . COPD with emphysema ([place])   . Hypertension      Past Surgical History: Past Surgical History:  Procedure Laterality Date  . HIP SURGERY Right   . 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 Hypertension Other      Social History: Social History   Social History  . Marital status: Single    Spouse name: N/A  . Number of children: N/A  . Years of education: N/A   Occupational History  . Not on file.   Social History Main Topics  . Smoking status: Former Smoker    Quit date: 11/11/1990  . Smokeless tobacco: Never Used  . Alcohol use Yes  . Drug use: No  . Sexual activity: Not on file   Other Topics Concern  . Not on file   Social History Narrative  . No narrative on file     Review of Systems: Review of Systems  Unable to perform ROS: Critical illness    Vital Signs: Blood pressure (!) 102/54, pulse 80, temperature 97.8 F (36.6 C), resp. rate 20, height 5' 5" (1.651 m), weight 56.3 kg (124 lb 1.9 oz), SpO2 100 %.  Weight trends: Filed Weights   05/02/2016 1205 05/23/2016 1933  Weight: 56.4 kg (124 lb  6.4 oz) 56.3 kg (124 lb 1.9 oz)    Physical Exam: General: Critically ill  Head: ETT  Eyes: Anicteric, PERRL  Neck: Supple, trachea midline  Lungs:  Bilateral crackles, PRVC FiO2 40%  Heart: Regular rate and rhythm  Abdomen:  Soft, nontender  Extremities:  ++ peripheral edema.  Neurologic: Intubated and sedated  Skin: No lesions  Access: none     Lab results: Basic Metabolic Panel:  Recent Labs Lab 05/23/2016 0751 05/02/16 0432  NA 137 137  K 5.0 5.1  CL 98* 101  CO2 27 26  GLUCOSE 88 87  BUN 67* 73*  CREATININE 2.78* 2.85*  CALCIUM 8.0* 7.4*    Liver Function Tests:  Recent Labs Lab 05/26/2016 0751  AST 34  ALT 29  ALKPHOS 170*  BILITOT 0.5  PROT 6.3*  ALBUMIN 3.0*   No results for input(s): LIPASE, AMYLASE in the last 168 hours. No results for input(s): AMMONIA in the last 168 hours.  CBC:  Recent Labs Lab 05/20/2016 0751 05/02/16 0432  WBC 12.1* 11.2*  NEUTROABS 11.1*  --   HGB 10.6* 10.2*  HCT 33.8* 32.7*  MCV 83.6 81.9  PLT 247 239    Cardiac Enzymes:  Recent Labs Lab 05/16/2016 0751  TROPONINI 0.20*    BNP: Invalid input(s): POCBNP  CBG:  Recent Labs Lab 05/16/2016 1806  GLUCAP 120*    Microbiology: Results for orders placed or performed during the hospital encounter of 04/27/2016  MRSA PCR Screening     Status: None   Collection Time: 05/20/2016 11:52 PM  Result Value Ref Range Status   MRSA by PCR NEGATIVE NEGATIVE Final    Comment:        The GeneXpert MRSA Assay (FDA approved for NASAL specimens only), is one component of a comprehensive MRSA colonization surveillance program. It is not intended to diagnose MRSA infection nor to guide or monitor treatment for MRSA infections.     Coagulation Studies:  Recent Labs  04/29/2016 0751  LABPROT 14.9  INR 1.16    Urinalysis:  Recent Labs  05/18/2016 1435  COLORURINE BROWN  LABSPEC 1.040*  PHURINE TEST NOT REPORTED DUE TO COLOR INTERFERENCE OF URINE PIGMENT   GLUCOSEU TEST NOT REPORTED DUE TO COLOR INTERFERENCE OF URINE PIGMENT*  HGBUR TEST NOT REPORTED DUE TO COLOR INTERFERENCE OF URINE PIGMENT*  BILIRUBINUR TEST NOT REPORTED DUE TO COLOR INTERFERENCE OF URINE PIGMENT*  KETONESUR TEST NOT REPORTED DUE TO COLOR INTERFERENCE OF URINE PIGMENT*  PROTEINUR TEST NOT REPORTED DUE TO COLOR INTERFERENCE OF URINE PIGMENT*  NITRITE TEST NOT REPORTED DUE TO COLOR INTERFERENCE OF URINE PIGMENT*  LEUKOCYTESUR TEST NOT REPORTED DUE TO COLOR INTERFERENCE OF URINE PIGMENT*      Imaging: Dg Abd 1 View  Result Date: 05/14/2016 CLINICAL DATA:  OG tube placement. EXAM: ABDOMEN - 1 VIEW COMPARISON:  None. FINDINGS: Tip and side port of the enteric tube below the diaphragm in the stomach. Bilateral pleural effusions, right greater than left. No evidence of free air. Nonspecific bowel gas pattern in the visualized upper abdomen. IMPRESSION: Tip and side port of the enteric tube below the diaphragm in the stomach. Electronically Signed   By: Jeb Levering M.D.   On: 05/23/2016 23:24   Dg Chest Port 1 View  Result Date: 04/29/2016 CLINICAL DATA:  Plaque [place] stools began yesterday common lower leg swelling, history of CHF EXAM: PORTABLE CHEST 1 VIEW COMPARISON:  Chest x-ray of 12/22/2015 and CT angiogram of the chest of the same date FINDINGS: There are now bilateral pleural effusions present with some basilar volume loss. With cardiomegaly and somewhat indistinct perihilar vasculature these findings may indicate mild congestive heart failure. The bones are somewhat osteopenic. IMPRESSION: Probable CHF with cardiomegaly, small effusions, and minimal pulmonary vascular congestion. Electronically Signed   By: Ivar Drape M.D.   On: 05/03/2016 08:27      Assessment & Plan: Ms. Helayna Dun is a 75 y.o. white female with COPD, CHF, hypertension, gout, who was admitted to [facility] on 05/25/2016   1. Acute Renal Failure: secondary to ATN, hypotension, hypoxia - foley  catheter placed - monitor urine output - No acute indication for dialysis.   2. Acute exacerbation of congestive heart failure with respiratory failure on mechanical ventilation. Concurrent acute exacerbation of COPD echocardiogram pending - IV furosemide  3. Acute GI Bleed with anemia: hemoglobin remains stable.   LOS: [place], Paighton Godette 12/7/201710:09 AM

## 2016-05-02 NOTE — Consult Note (Signed)
   [facility] CM Inpatient Consult   05/02/2016  Jacqueline Boyd 13-Nov-1940 [id number]    [facility] Care Management referral received. Chart reviewed. Patient on vent in ICU. Not appropriate time to attempt to engage for potential [facility] Care Management services at this time. Will continue to follow and engage when/if appropriate.    Marthenia Rolling, MSN-Ed, RN,BSN [facility] Liaison [phone]

## 2016-05-02 NOTE — Progress Notes (Signed)
Initial Nutrition Assessment  DOCUMENTATION CODES:      INTERVENTION:  -Received verbal order from MD Ramachandran to start TF. Recommend starting Vital High Protein at goal of 50 ml/hr providing 1200 kcals, 106 g protein and 1088 mL of free water. PEPuP initiated. Continue to assess  NUTRITION DIAGNOSIS:   Inadequate oral intake related to acute illness as evidenced by NPO status.  GOAL:   Provide needs based on ASPEN/SCCM guidelines  MONITOR:   Vent status, Labs, Weight trends, TF tolerance  REASON FOR ASSESSMENT:   Ventilator    ASSESSMENT:    75 yo female admitted with upper GI bleed, ARF. Pt s/p rapid response on 12/6, transferred to ICU and intubated. Pt with acute on chronic respiratory failure with COPD exacerbation and bilateral pleural effusion   Hgb stable with no signs of active bleeding  Patient is currently intubated on ventilator support, OG tube in place MV: 7.9 L/min Temp (24hrs), Avg:97.6 F (36.4 C), Min:97.2 F (36.2 C), Max:97.9 F (36.6 C)  Labs: Hgb 10.2 Meds: remeron, MVI  Diet Order:   NPO  Skin:  Reviewed, no issues (no presssure ulcer)  Last BM:  12/6 liquid stool, C.diff pending  Height:   Ht Readings from Last 1 Encounters:  05/22/2016 5\' 5"  (1.651 m)    Weight:   Wt Readings from Last 1 Encounters:  05/07/2016 124 lb 1.9 oz (56.3 kg)   Filed Weights   05/22/2016 1205 04/28/2016 1933  Weight: 124 lb 6.4 oz (56.4 kg) 124 lb 1.9 oz (56.3 kg)    BMI:  Body mass index is 20.65 kg/m.  Estimated Nutritional Needs:   Kcal:  [id number] kcals  Protein:  67-112 g  Fluid:  >/= 1.4 L  EDUCATION NEEDS:   No education needs identified at this time  [place], [place], [place] [phone] Pager  [phone] Weekend/On-Call Pager

## 2016-05-03 ENCOUNTER — Inpatient Hospital Stay: Payer: Commercial Managed Care - HMO

## 2016-05-03 DIAGNOSIS — N171 Acute kidney failure with acute cortical necrosis: Secondary | ICD-10-CM

## 2016-05-03 DIAGNOSIS — I5022 Chronic systolic (congestive) heart failure: Secondary | ICD-10-CM

## 2016-05-03 LAB — BASIC METABOLIC PANEL
ANION GAP: 7 (ref 5–15)
BUN: 76 mg/dL — ABNORMAL HIGH (ref 6–20)
CHLORIDE: 103 mmol/L (ref 101–111)
CO2: 28 mmol/L (ref 22–32)
Calcium: 7.5 mg/dL — ABNORMAL LOW (ref 8.9–10.3)
Creatinine, Ser: 2.36 mg/dL — ABNORMAL HIGH (ref 0.44–1.00)
GFR calc non Af Amer: 19 mL/min — ABNORMAL LOW (ref >60)
GFR, EST AFRICAN AMERICAN: 22 mL/min — AB (ref >60)
GLUCOSE: 123 mg/dL — AB (ref 65–99)
POTASSIUM: 4.4 mmol/L (ref 3.5–5.1)
Sodium: 138 mmol/L (ref 135–145)

## 2016-05-03 LAB — MAGNESIUM: MAGNESIUM: 2.3 mg/dL (ref 1.7–2.4)

## 2016-05-03 LAB — PROCALCITONIN: Procalcitonin: 14.5 ng/mL

## 2016-05-03 LAB — CBC
HEMATOCRIT: 33 % — AB (ref 35.0–47.0)
HEMOGLOBIN: 10.5 g/dL — AB (ref 12.0–16.0)
MCH: 26.2 pg (ref 26.0–34.0)
MCHC: 31.9 g/dL — ABNORMAL LOW (ref 32.0–36.0)
MCV: 82 fL (ref 80.0–100.0)
Platelets: 262 10*3/uL (ref 150–440)
RBC: 4.02 MIL/uL (ref 3.80–5.20)
RDW: 17.3 % — AB (ref 11.5–14.5)
WBC: 8.4 10*3/uL (ref 3.6–11.0)

## 2016-05-03 LAB — GLUCOSE, CAPILLARY
Glucose-Capillary: 116 mg/dL — ABNORMAL HIGH (ref 65–99)
Glucose-Capillary: 142 mg/dL — ABNORMAL HIGH (ref 65–99)
Glucose-Capillary: 116 mg/dL — ABNORMAL HIGH (ref 65–99)

## 2016-05-03 LAB — PHOSPHORUS: Phosphorus: 4.7 mg/dL — ABNORMAL HIGH (ref 2.5–4.6)

## 2016-05-03 MED ORDER — SODIUM CHLORIDE 0.9 % IV BOLUS (SEPSIS)
500.0000 mL | Freq: Once | INTRAVENOUS | Status: AC
  Administered 2016-05-03: 500 mL via INTRAVENOUS

## 2016-05-03 MED ORDER — DILTIAZEM HCL 25 MG/5ML IV SOLN
INTRAVENOUS | Status: AC
  Filled 2016-05-03: qty 5

## 2016-05-03 MED ORDER — AMIODARONE HCL IN DEXTROSE 360-4.14 MG/200ML-% IV SOLN
60.0000 mg/h | INTRAVENOUS | Status: DC
  Administered 2016-05-03 – 2016-05-04 (×2): 30 mg/h via INTRAVENOUS
  Administered 2016-05-05 (×2): 60 mg/h via INTRAVENOUS
  Administered 2016-05-05: 30 mg/h via INTRAVENOUS
  Filled 2016-05-03 (×5): qty 200

## 2016-05-03 MED ORDER — VANCOMYCIN 50 MG/ML ORAL SOLUTION
125.0000 mg | Freq: Four times a day (QID) | ORAL | Status: DC
  Administered 2016-05-03 – 2016-05-05 (×6): 125 mg via ORAL
  Filled 2016-05-03 (×8): qty 2.5

## 2016-05-03 MED ORDER — AMIODARONE LOAD VIA INFUSION
150.0000 mg | Freq: Once | INTRAVENOUS | Status: AC
  Administered 2016-05-03: 150 mg via INTRAVENOUS
  Filled 2016-05-03: qty 83.34

## 2016-05-03 MED ORDER — AMIODARONE HCL IN DEXTROSE 360-4.14 MG/200ML-% IV SOLN
60.0000 mg/h | INTRAVENOUS | Status: AC
  Administered 2016-05-03: 60 mg/h via INTRAVENOUS
  Filled 2016-05-03: qty 200

## 2016-05-03 NOTE — Progress Notes (Signed)
[facility]  ROUNDING NOTE   Subjective:   Tmax 101  Extubated today  UOP 1200  Creatinine 2.36 (2.85)  C diff positive  fursoemide 79m IV q12  Objective:  Vital signs in last 24 hours:  Temp:  [98.3 F (36.8 C)-101 F (38.3 C)] 101 F (38.3 C) (12/08 0500) Pulse Rate:  [38-106] 101 (12/08 0600) Resp:  [10-21] 16 (12/08 0600) BP: (81-115)/(36-67) 98/50 (12/08 0600) SpO2:  [93 %-99 %] 99 % (12/08 0600) FiO2 (%):  [35 %] 35 % (12/08 0752) Weight:  [56.4 kg (124 lb 5.4 oz)-59.6 kg (131 lb 6.3 oz)] 59.6 kg (131 lb 6.3 oz) (12/08 0600)  Weight change: -0.027 kg (-1 oz) Filed Weights   04/30/2016 1933 05/02/16 1400 05/03/16 0600  Weight: 56.3 kg (124 lb 1.9 oz) 56.4 kg (124 lb 5.4 oz) 59.6 kg (131 lb 6.3 oz)    Intake/Output: I/O last 3 completed shifts: In: 868.1 [I.V.:169.8; NG/GT:698.3] Out: 1200 [Urine:1200]   Intake/Output this shift:  No intake/output data recorded.  Physical Exam: General: Ill appearing, cachectic  Head:   Moist oral mucosal membranes  Eyes: Anicteric, PERRL  Neck: Supple, trachea midline  Lungs:  Bilateral crackels  Heart: Regular rate and rhythm  Abdomen:  Soft, nontender  Extremities: + peripheral edema.  Neurologic: Nonfocal, moving all four extremities  Skin: No lesions       Basic Metabolic Panel:  Recent Labs Lab 05/07/2016 0751 05/02/16 0432 05/02/16 1358 05/02/16 1654 05/03/16 0351  NA 137 137  --   --  138  K 5.0 5.1  --   --  4.4  CL 98* 101  --   --  103  CO2 27 26  --   --  28  GLUCOSE 88 87  --   --  123*  BUN 67* 73*  --   --  76*  CREATININE 2.78* 2.85*  --   --  2.36*  CALCIUM 8.0* 7.4*  --   --  7.5*  MG  --   --  2.4 2.3 2.3  PHOS  --   --  4.9* 4.7* 4.7*    Liver Function Tests:  Recent Labs Lab 04/27/2016 0751  AST 34  ALT 29  ALKPHOS 170*  BILITOT 0.5  PROT 6.3*  ALBUMIN 3.0*   No results for input(s): LIPASE, AMYLASE in the last 168 hours. No results for input(s): AMMONIA in  the last 168 hours.  CBC:  Recent Labs Lab 05/05/2016 0751 05/02/16 0432 05/03/16 0351  WBC 12.1* 11.2* 8.4  NEUTROABS 11.1*  --   --   HGB 10.6* 10.2* 10.5*  HCT 33.8* 32.7* 33.0*  MCV 83.6 81.9 82.0  PLT 247 239 262    Cardiac Enzymes:  Recent Labs Lab 04/28/2016 0751  TROPONINI 0.20*    BNP: Invalid input(s): POCBNP  CBG:  Recent Labs Lab 05/02/16 1707 05/02/16 1954 05/02/16 2338 05/03/16 0322 05/03/16 0835  GLUCAP 733111* 106* 116* 142*    Microbiology: Results for orders placed or performed during the hospital encounter of 05/20/2016  MRSA PCR Screening     Status: None   Collection Time: 05/04/2016 11:52 PM  Result Value Ref Range Status   MRSA by PCR NEGATIVE NEGATIVE Final    Comment:        The GeneXpert MRSA Assay (FDA approved for NASAL specimens only), is one component of a comprehensive MRSA colonization surveillance program. It is not intended to diagnose MRSA infection nor to guide or  monitor treatment for MRSA infections.   C difficile quick screen w PCR reflex     Status: Abnormal   Collection Time: 05/02/16 10:02 AM  Result Value Ref Range Status   C Diff antigen POSITIVE (A) NEGATIVE Final   C Diff toxin POSITIVE (A) NEGATIVE Final   C Diff interpretation Toxin producing C. difficile detected.  Final    Comment: CRITICAL RESULT CALLED TO, READ BACK BY AND VERIFIED WITH: RENEE BABB 05/02/16 @ 2052  Ash[place]     Coagulation Studies:  Recent Labs  05/23/2016 0751  LABPROT 14.9  INR 1.16    Urinalysis:  Recent Labs  05/07/2016 1435  COLORURINE BROWN  LABSPEC 1.040*  PHURINE TEST NOT REPORTED DUE TO COLOR INTERFERENCE OF URINE PIGMENT  GLUCOSEU TEST NOT REPORTED DUE TO COLOR INTERFERENCE OF URINE PIGMENT*  HGBUR TEST NOT REPORTED DUE TO COLOR INTERFERENCE OF URINE PIGMENT*  BILIRUBINUR TEST NOT REPORTED DUE TO COLOR INTERFERENCE OF URINE PIGMENT*  KETONESUR TEST NOT REPORTED DUE TO COLOR INTERFERENCE OF URINE PIGMENT*  PROTEINUR  TEST NOT REPORTED DUE TO COLOR INTERFERENCE OF URINE PIGMENT*  NITRITE TEST NOT REPORTED DUE TO COLOR INTERFERENCE OF URINE PIGMENT*  LEUKOCYTESUR TEST NOT REPORTED DUE TO COLOR INTERFERENCE OF URINE PIGMENT*      Imaging: Dg Chest 1 View  Result Date: 05/03/2016 CLINICAL DATA:  Dyspnea.  Respiratory failure EXAM: CHEST 1 VIEW COMPARISON:  05/16/2016 FINDINGS: Endotracheal tube is 4.1 cm above the carina. Nasogastric tube extends well into the stomach. Pleural effusions and adjacent basilar opacities persist bilaterally. IMPRESSION: 1.  Support equipment appears satisfactorily positioned. 2. No significant interval change in the bilateral airspace opacities and effusions. Electronically Signed   By: Jacqueline Boyd M.D.   On: 05/03/2016 06:20   Dg Abd 1 View  Result Date: 05/04/2016 CLINICAL DATA:  OG tube placement. EXAM: ABDOMEN - 1 VIEW COMPARISON:  None. FINDINGS: Tip and side port of the enteric tube below the diaphragm in the stomach. Bilateral pleural effusions, right greater than left. No evidence of free air. Nonspecific bowel gas pattern in the visualized upper abdomen. IMPRESSION: Tip and side port of the enteric tube below the diaphragm in the stomach. Electronically Signed   By: Jacqueline Boyd M.D.   On: 05/08/2016 23:24     Medications:   . fentaNYL infusion INTRAVENOUS 40 mcg/hr (05/25/2016 2230)  . phenylephrine (NEO-SYNEPHRINE) Adult infusion Stopped (05/24/2016 2055)   . chlorhexidine gluconate (MEDLINE KIT)  15 mL Mouth Rinse BID  . feeding supplement (VITAL HIGH PROTEIN)  1,000 mL Per Tube Q24H  . fentaNYL (SUBLIMAZE) injection  50 mcg Intravenous Once  . furosemide  20 mg Intravenous BID  . mouth rinse  15 mL Mouth Rinse QID  . pantoprazole (PROTONIX) IV  40 mg Intravenous Q12H  . sodium chloride flush  3 mL Intravenous Q12H  . vancomycin  125 mg Per Tube Q6H   acetaminophen **OR** acetaminophen, ALPRAZolam, diphenhydrAMINE, fentaNYL, ipratropium-albuterol,  ondansetron **OR** ondansetron (ZOFRAN) IV  Assessment/ Plan:  [name] is a 75 y.o. white female with COPD, CHF, hypertension, gout, who was admitted to [facility] on 04/29/2016   1. Acute Renal Failure: secondary to ATN, hypotension, hypoxia Nonoliguric urine output. Creatinine improving.  - No acute indication for dialysis.   2. Acute exacerbation of systolic congestive heart failure with respiratory failure on mechanical ventilation. Concurrent acute exacerbation of COPD - IV furosemide  3. Acute GI Bleed with anemia: hemoglobin remains stable.    LOS: 2 Ehan Freas,  Anayah Arvanitis 12/8/201710:12 AM

## 2016-05-03 NOTE — Progress Notes (Signed)
Brief Nutrition Note:   Pt s/p extubation this AM, TF discontinued with extubation. NPO  Await diet progression, recommend nutritional supplement once diet advanced  Kerman Passey MS, RD, LDN [phone] Pager  [phone] Weekend/On-Call Pager

## 2016-05-03 NOTE — Progress Notes (Signed)
Pt noted to be in Afib with RVR, rate 100-130.  SBP in the 90's.   --Will start amio load and infusion.   -Marda Stalker, M.D.

## 2016-05-03 NOTE — Progress Notes (Signed)
[facility] [place] NAME: Jacqueline Boyd    MRN#:  [id number]  DATE OF BIRTH:  September 10, 1940  SUBJECTIVE:  Hospital Day: 2 days Jacqueline Boyd is a 75 y.o. female presenting with Melena .   Overnight events: No overnight events Interval Events: Extubated patient complains being tired  REVIEW OF SYSTEMS:  REVIEW OF SYSTEMS:  CONSTITUTIONAL: Denies fevers, chills, Positive fatigue, weakness.  EYES: Denies blurred vision, double vision, or eye pain.  EARS, NOSE, THROAT: Denies tinnitus, ear pain, hearing loss.  RESPIRATORY: denies cough, shortness of breath, wheezing  CARDIOVASCULAR: Denies chest pain, palpitations, positive edema.  GASTROINTESTINAL: Denies nausea, vomiting, positive diarrhea, denies abdominal pain.  GENITOURINARY: Denies dysuria, hematuria.  ENDOCRINE: Denies nocturia or thyroid problems. HEMATOLOGIC AND LYMPHATIC: Denies easy bruising or bleeding.  SKIN: Denies rash or lesions.  MUSCULOSKELETAL: Denies pain in neck, back, shoulder, knees, hips, or further arthritic symptoms.  NEUROLOGIC: Denies paralysis, paresthesias.  PSYCHIATRIC: Denies anxiety or depressive symptoms. Otherwise full review of systems performed by me is negative.   DRUG ALLERGIES:   Allergies  Allergen Reactions  . Codeine Other (See Comments)    Itching and "feels like worms crawling in body".  . Penicillins Rash    Has patient had a PCN reaction causing immediate rash, facial/tongue/throat swelling, SOB or lightheadedness with hypotension: No  Has patient had a PCN reaction causing severe rash involving mucus membranes or skin necrosis: No Has patient had a PCN reaction that required hospitalization: No   Has patient had a PCN reaction occurring within the last 10 years: No  If all of the above answers are "NO", then may proceed with Cephalosporin use.   . Prednisone Palpitations    VITALS:  Blood pressure (!) 98/50, pulse (!) 101,  temperature (!) 101 F (38.3 C), temperature source Oral, resp. rate 16, height 5\' 5"  (1.651 m), weight 59.6 kg (131 lb 6.3 oz), SpO2 99 %.  PHYSICAL EXAMINATION:   VITAL SIGNS: Vitals:   05/03/16 0500 05/03/16 0600  BP: (!) 81/63 (!) 98/50  Pulse: (!) 38 (!) 101  Resp: 19 16  Temp: (!) 101 F (38.3 C)    GENERAL:75 y.o.female currently in no acute distress. Extubated chronically ill-appearing HEAD: Normocephalic, atraumatic.  EYES: Pupils equal, round, reactive to light. Extraocular muscles intact. No scleral icterus.  MOUTH: Dry mucosal membrane. Dentition intact. No abscess noted.  EAR, NOSE, THROAT: Clear without exudates. No external lesions.  NECK: Supple. No thyromegaly. No nodules. No JVD.  PULMONARY: Diminished without wheeze rails or rhonci. No use of accessory muscles, Good respiratory effort. good air entry bilaterally CHEST: Nontender to palpation.  CARDIOVASCULAR: S1 and S2. Regular rate and rhythm. No murmurs, rubs, or gallops. No edema. Pedal pulses 2+ bilaterally.  GASTROINTESTINAL: Soft, nontender, nondistended. No masses. Positive bowel sounds. No hepatosplenomegaly.  MUSCULOSKELETAL: No swelling, clubbing, or edema. Range of motion full in all extremities.  NEUROLOGIC: Cranial nerves II through XII are intact. No gross focal neurological deficits. Sensation intact. Reflexes intact.  SKIN: No ulceration, lesions, rashes, or cyanosis. Skin warm and dry. Turgor intact.  PSYCHIATRIC: Mood, affect within normal limits. The patient is awake, alert and oriented x 3. Insight, judgment intact.        LABORATORY PANEL:   CBC  Recent Labs Lab 05/03/16 0351  WBC 8.4  HGB 10.5*  HCT 33.0*  PLT 262   ------------------------------------------------------------------------------------------------------------------  Chemistries   Recent Labs Lab 05/16/2016 0751  05/03/16 0351  NA  137  < > 138  K 5.0  < > 4.4  CL 98*  < > 103  CO2 27  < > 28  GLUCOSE 88  < >  123*  BUN 67*  < > 76*  CREATININE 2.78*  < > 2.36*  CALCIUM 8.0*  < > 7.5*  MG  --   < > 2.3  AST 34  --   --   ALT 29  --   --   ALKPHOS 170*  --   --   BILITOT 0.5  --   --   < > = values in this interval not displayed. ------------------------------------------------------------------------------------------------------------------  Cardiac Enzymes  Recent Labs Lab 05/26/2016 0751  TROPONINI 0.20*   ------------------------------------------------------------------------------------------------------------------  RADIOLOGY:  Dg Chest 1 View  Result Date: 05/03/2016 CLINICAL DATA:  Dyspnea.  Respiratory failure EXAM: CHEST 1 VIEW COMPARISON:  05/05/2016 FINDINGS: Endotracheal tube is 4.1 cm above the carina. Nasogastric tube extends well into the stomach. Pleural effusions and adjacent basilar opacities persist bilaterally. IMPRESSION: 1.  Support equipment appears satisfactorily positioned. 2. No significant interval change in the bilateral airspace opacities and effusions. Electronically Signed   By: Andreas Newport M.D.   On: 05/03/2016 06:20   Dg Abd 1 View  Result Date: 05/13/2016 CLINICAL DATA:  OG tube placement. EXAM: ABDOMEN - 1 VIEW COMPARISON:  None. FINDINGS: Tip and side port of the enteric tube below the diaphragm in the stomach. Bilateral pleural effusions, right greater than left. No evidence of free air. Nonspecific bowel gas pattern in the visualized upper abdomen. IMPRESSION: Tip and side port of the enteric tube below the diaphragm in the stomach. Electronically Signed   By: Jeb Levering M.D.   On: 05/03/2016 23:24    EKG:   Orders placed or performed during the hospital encounter of 05/22/2016  . EKG 12-Lead  . EKG 12-Lead    ASSESSMENT AND PLAN:   Jacqueline Boyd is a 75 y.o. female presenting with Melena . Admitted 04/30/2016 : Day #: 2 days  1. C. difficile: Being criteria for severe C. difficile infections and on oral vancomycin continue enteric  precautions 2. Upper GI bleed: Hemoglobin stable with Protonix GI input appreciated 3. Acute on chronic diastolic congestive heart failure: Cardiology input appreciated, continue diuresis, some improvement in renal function 4. Acute renal failure: Careful diuresis nephrology appreciated 5. Acute on chronic respiratory failure with hypoxia: extubated continue oxygen as required  All the records are reviewed and case discussed with Care Management/Social Workerr. Management plans discussed with the patient, family and they are in agreement.  CODE STATUS: Partial, no CPR TOTAL TIME TAKING CARE OF THIS PATIENT: 33 minutes.   POSSIBLE D/C IN 2-3DAYS, DEPENDING ON CLINICAL CONDITION.   Kambree Krauss,  Karenann Cai.D on 05/03/2016 at 12:24 PM  Between 7am to 6pm - Pager - [phone]  After 6pm: House Pager: - [facility]  Office  [phone]  CC: Primary care physician; Marinda Elk, MD

## 2016-05-03 NOTE — Progress Notes (Signed)
PULMONARY / CRITICAL CARE MEDICINE   Name: Jacqueline Boyd MRN: [id number] DOB: 01/04/1941    ADMISSION DATE:  05/19/2016   Subjective: REVIEW OF SYSTEMS:   Unable to obtain as the Patient is intubated and critically ill  SUBJECTIVE:  Unable to obtain as the patient is intubated  VITAL SIGNS: BP (!) 98/50   Pulse (!) 101   Temp (!) 101 F (38.3 C) (Oral)   Resp 16   Ht 5\' 5"  (1.651 m)   Wt 59.6 kg (131 lb 6.3 oz)   SpO2 99%   BMI 21.87 kg/m     VENTILATOR SETTINGS: Vent Mode: PSV FiO2 (%):  [35 %] 35 % Set Rate:  [10 bmp] 10 bmp Vt Set:  [400 mL] 400 mL PEEP:  [5 cmH20] 5 cmH20 Pressure Support:  [10 cmH20] 10 cmH20 Plateau Pressure:  [13 cmH20-14 cmH20] 13 cmH20  INTAKE / OUTPUT: I/O last 3 completed shifts: In: 868.1 [I.V.:169.8; NG/GT:698.3] Out: 1200 [Urine:1200]  PHYSICAL EXAMINATION: General:  Frail elderly white female, intubated and on mechanical ventilation. Neuro:  Follows simple commands HEENT: Sunken eyes, Atraumatic, Normocephalic, upward gaze, pupils 3+briskly reacting. Cardiovascular: S1S2, Irregularly Irregular, murmur noted Lungs:  Diminished bibasilar, no wheezes, crackles, rhonchi noted Abdomen:  Flat, soft, nontender, active bowel sound Musculoskeletal: b/l lower extremity edema and blisters present. Skin:  Dressing CDI on left lower extremity  LABS:  BMET  Recent Labs Lab 05/22/2016 0751 05/02/16 0432 05/03/16 0351  NA 137 137 138  K 5.0 5.1 4.4  CL 98* 101 103  CO2 27 26 28   BUN 67* 73* 76*  CREATININE 2.78* 2.85* 2.36*  GLUCOSE 88 87 123*    Electrolytes  Recent Labs Lab 05/05/2016 0751 05/02/16 0432 05/02/16 1358 05/02/16 1654 05/03/16 0351  CALCIUM 8.0* 7.4*  --   --  7.5*  MG  --   --  2.4 2.3 2.3  PHOS  --   --  4.9* 4.7* 4.7*    CBC  Recent Labs Lab 05/08/2016 0751 05/02/16 0432 05/03/16 0351  WBC 12.1* 11.2* 8.4  HGB 10.6* 10.2* 10.5*  HCT 33.8* 32.7* 33.0*  PLT 247 239 262    Coag's  Recent  Labs Lab 05/21/2016 0751  APTT 36  INR 1.16    Sepsis Markers  Recent Labs Lab 05/14/2016 0828 05/03/16 0351  PROCALCITON 1.71 14.50    ABG  Recent Labs Lab 05/07/2016 2120  PHART 7.30*  PCO2ART 51*  PO2ART 104    Liver Enzymes  Recent Labs Lab 05/20/2016 0751  AST 34  ALT 29  ALKPHOS 170*  BILITOT 0.5  ALBUMIN 3.0*    Cardiac Enzymes  Recent Labs Lab 05/08/2016 0751  TROPONINI 0.20*    Glucose  Recent Labs Lab 05/02/16 1616 05/02/16 1707 05/02/16 1954 05/02/16 2338 05/03/16 0322 05/03/16 0835  GLUCAP 67 78 111* 106* 116* 142*    Imaging Dg Chest 1 View  Result Date: 05/03/2016 CLINICAL DATA:  Dyspnea.  Respiratory failure EXAM: CHEST 1 VIEW COMPARISON:  04/30/2016 FINDINGS: Endotracheal tube is 4.1 cm above the carina. Nasogastric tube extends well into the stomach. Pleural effusions and adjacent basilar opacities persist bilaterally. IMPRESSION: 1.  Support equipment appears satisfactorily positioned. 2. No significant interval change in the bilateral airspace opacities and effusions. Electronically Signed   By: Andreas Newport M.D.   On: 05/03/2016 06:20     STUDIES:  12/22/15 CT angio Chest>>No acute abnormality. No evidence of pulmonary emboli  123456 echo>> Diastolic dysfunction- 99991111  CULTURES: None  ANTIBIOTICS: none  SIGNIFICANT EVENTS: 12/6>> Patient transferred  to ICU unresponsive and hypoxemic, required emergent intubation  LINES/TUBES: none   ASSESSMENT / PLAN:  PULMONARY A: Acute On chronic Respiratory Failure COPD exacerbation Bilateral Pleural effussion P:   Continue full vent support SBT trials extubate if tolerated.  Bronchodilators  CARDIOVASCULAR A:  Diastolic Dysfunction grade -2 Elevated troponin possibly due to demand ischemia Acute systolic CHF.  P:  Continuous Telemetry neosynephrine-- off.  ECHO 05/02/16; EF=30% Keep MAP goals>60 Cardiology consulted  RENAL A:   Acute Renal  failure P:   Nephrology consulted Replace electrolytes per ICU protocol Avoid nephrotoxic drugs  GASTROINTESTINAL A:   Upper GI bleed P: NPO   Continue Protonix  HEMATOLOGIC A:   No active issues P:  H/H stable Transfuse if Hgb<7  INFECTIOUS A:   ?CAP P:   procalcitonin 1.71 Azithromycin X5 days  ENDOCRINE A:   No active issues P:   Monitor BS intermittently with BMP  NEUROLOGIC A:   Anxiety P:   RASS goal: 0 to -1 Continue Alprazolam Frequent reorientation Lights on during day   FAMILY  --  Marda Stalker, M.D.    05/03/2016, 9:46 AM   Critical Care Attestation.  I have personally obtained a history, examined the patient, evaluated laboratory and imaging results, formulated the assessment and plan and placed orders. The Patient requires high complexity decision making for assessment and support, frequent evaluation and titration of therapies, application of advanced monitoring technologies and extensive interpretation of multiple databases. The patient has critical illness that could lead imminently to failure of 1 or more organ systems and requires the highest level of physician preparedness to intervene.  Critical Care Time devoted to patient care services described in this note is 45 minutes and is exclusive of time spent in procedures.

## 2016-05-03 NOTE — Progress Notes (Signed)
RT to room to extubate patient per Dr. Juanell Fairly order.  Patient suctioned prior to extubation, extubated, and placed on 2LPM [place] without complication.  Will continue to monitor.

## 2016-05-03 NOTE — Progress Notes (Signed)
Extubated this am. Tolerated 2 liters  Per nasal canulaall day. Passed bedside swallow eval and drank water as well as ate ice cream and jello. Pulse ox. And b/p up and down. Alert x 4. Noted to be in A.Fib continuously at 1330. Dr.Ram informed-orders received.

## 2016-05-04 ENCOUNTER — Inpatient Hospital Stay: Payer: Commercial Managed Care - HMO

## 2016-05-04 DIAGNOSIS — J441 Chronic obstructive pulmonary disease with (acute) exacerbation: Secondary | ICD-10-CM

## 2016-05-04 DIAGNOSIS — K922 Gastrointestinal hemorrhage, unspecified: Secondary | ICD-10-CM

## 2016-05-04 DIAGNOSIS — A0472 Enterocolitis due to Clostridium difficile, not specified as recurrent: Secondary | ICD-10-CM

## 2016-05-04 DIAGNOSIS — N179 Acute kidney failure, unspecified: Secondary | ICD-10-CM

## 2016-05-04 LAB — URINALYSIS, ROUTINE W REFLEX MICROSCOPIC
BILIRUBIN URINE: NEGATIVE
Glucose, UA: NEGATIVE mg/dL
HGB URINE DIPSTICK: NEGATIVE
KETONES UR: NEGATIVE mg/dL
Nitrite: NEGATIVE
PROTEIN: 30 mg/dL — AB
Specific Gravity, Urine: 1.019 (ref 1.005–1.030)
pH: 5 (ref 5.0–8.0)

## 2016-05-04 LAB — BLOOD GAS, ARTERIAL
ACID-BASE DEFICIT: 1.7 mmol/L (ref 0.0–2.0)
BICARBONATE: 25.1 mmol/L (ref 20.0–28.0)
FIO2: 0.4
LHR: 15 {breaths}/min
O2 SAT: 97.4 %
PATIENT TEMPERATURE: 37
PCO2 ART: 51 mmHg — AB (ref 32.0–48.0)
PEEP/CPAP: 5 cmH2O
PH ART: 7.3 — AB (ref 7.350–7.450)
VT: 400 mL
pO2, Arterial: 104 mmHg (ref 83.0–108.0)

## 2016-05-04 LAB — CBC
HCT: 37.1 % (ref 35.0–47.0)
Hemoglobin: 11.3 g/dL — ABNORMAL LOW (ref 12.0–16.0)
MCH: 25.5 pg — ABNORMAL LOW (ref 26.0–34.0)
MCHC: 30.5 g/dL — AB (ref 32.0–36.0)
MCV: 83.6 fL (ref 80.0–100.0)
PLATELETS: 217 10*3/uL (ref 150–440)
RBC: 4.44 MIL/uL (ref 3.80–5.20)
RDW: 17.4 % — AB (ref 11.5–14.5)
WBC: 19.2 10*3/uL — AB (ref 3.6–11.0)

## 2016-05-04 LAB — PHOSPHORUS: Phosphorus: 4.9 mg/dL — ABNORMAL HIGH (ref 2.5–4.6)

## 2016-05-04 LAB — BASIC METABOLIC PANEL
ANION GAP: 8 (ref 5–15)
BUN: 80 mg/dL — AB (ref 6–20)
CALCIUM: 7.5 mg/dL — AB (ref 8.9–10.3)
CO2: 26 mmol/L (ref 22–32)
CREATININE: 2.37 mg/dL — AB (ref 0.44–1.00)
Chloride: 104 mmol/L (ref 101–111)
GFR calc Af Amer: 22 mL/min — ABNORMAL LOW (ref >60)
GFR, EST NON AFRICAN AMERICAN: 19 mL/min — AB (ref >60)
GLUCOSE: 120 mg/dL — AB (ref 65–99)
Potassium: 4.7 mmol/L (ref 3.5–5.1)
Sodium: 138 mmol/L (ref 135–145)

## 2016-05-04 LAB — MAGNESIUM: Magnesium: 2.2 mg/dL (ref 1.7–2.4)

## 2016-05-04 MED ORDER — SODIUM CHLORIDE 0.9 % IV BOLUS (SEPSIS)
1000.0000 mL | Freq: Once | INTRAVENOUS | Status: AC
Start: 2016-05-04 — End: 2016-05-04
  Administered 2016-05-04: 02:00:00 via INTRAVENOUS
  Administered 2016-05-04: 1000 mL via INTRAVENOUS

## 2016-05-04 MED ORDER — FUROSEMIDE 10 MG/ML IJ SOLN
80.0000 mg | Freq: Once | INTRAMUSCULAR | Status: AC
  Administered 2016-05-04: 80 mg via INTRAVENOUS
  Filled 2016-05-04: qty 8

## 2016-05-04 MED ORDER — AMIODARONE HCL 200 MG PO TABS
200.0000 mg | ORAL_TABLET | Freq: Two times a day (BID) | ORAL | Status: DC
  Administered 2016-05-04 (×2): 200 mg via ORAL
  Filled 2016-05-04 (×2): qty 1

## 2016-05-04 MED ORDER — FUROSEMIDE 40 MG PO TABS: 40.0000 mg | ORAL_TABLET | Freq: Every day | ORAL | Status: DC

## 2016-05-04 MED ORDER — FUROSEMIDE 10 MG/ML IJ SOLN
5.0000 mg/h | INTRAVENOUS | Status: DC
  Administered 2016-05-04: 5 mg/h via INTRAVENOUS
  Filled 2016-05-04: qty 25

## 2016-05-04 NOTE — Progress Notes (Signed)
PULMONARY / CRITICAL CARE MEDICINE   Name: Jacqueline Boyd MRN: [id number] DOB: February 10, 1941    ADMISSION DATE:  05/26/2016   SUBJECTIVE:  Extubated on 12/8, tolerating bipap overnight well,   REVIEW OF SYSTEMS:      VITAL SIGNS: BP (!) 111/41   Pulse (!) 46   Temp 97.4 F (36.3 C)   Resp 20   Ht 5\' 5"  (1.651 m)   Wt 123 lb 7.3 oz (56 kg)   SpO2 95%   BMI 20.54 kg/m     VENTILATOR SETTINGS:    INTAKE / OUTPUT: I/O last 3 completed shifts: In: 3254.4 [P.O.:480; I.V.:426.1; Other:750; NG/GT:598.3; IV Piggyback:1000] Out: [id number] [Urine:180; Stool:475]  PHYSICAL EXAMINATION: General:  Frail elderly white female, intubated and on mechanical ventilation. Neuro:  Follows simple commands HEENT: Sunken eyes, Atraumatic, Normocephalic, upward gaze, pupils 3+briskly reacting. Cardiovascular: S1S2, Irregularly Irregular, murmur noted Lungs:  Diminished bibasilar, no wheezes, crackles, rhonchi noted Abdomen:  Flat, soft, nontender, active bowel sound Musculoskeletal: b/l lower extremity edema and blisters present. Skin:  Dressing CDI on left lower extremity  LABS:  BMET  Recent Labs Lab 05/02/16 0432 05/03/16 0351 05/04/16 0501  NA 137 138 138  K 5.1 4.4 4.7  CL 101 103 104  CO2 26 28 26   BUN 73* 76* 80*  CREATININE 2.85* 2.36* 2.37*  GLUCOSE 87 123* 120*    Electrolytes  Recent Labs Lab 05/02/16 0432  05/02/16 1654 05/03/16 0351 05/04/16 0501  CALCIUM 7.4*  --   --  7.5* 7.5*  MG  --   < > 2.3 2.3 2.2  PHOS  --   < > 4.7* 4.7* 4.9*  < > = values in this interval not displayed.  CBC  Recent Labs Lab 05/02/16 0432 05/03/16 0351 05/04/16 0501  WBC 11.2* 8.4 19.2*  HGB 10.2* 10.5* 11.3*  HCT 32.7* 33.0* 37.1  PLT 239 262 217    Coag's  Recent Labs Lab 04/26/2016 0751  APTT 36  INR 1.16    Sepsis Markers  Recent Labs Lab 05/23/2016 0828 05/03/16 0351  PROCALCITON 1.71 14.50    ABG  Recent Labs Lab 05/23/2016 2120  PHART 7.30*  PCO2ART  51*  PO2ART 104    Liver Enzymes  Recent Labs Lab 05/23/2016 0751  AST 34  ALT 29  ALKPHOS 170*  BILITOT 0.5  ALBUMIN 3.0*    Cardiac Enzymes  Recent Labs Lab 05/15/2016 0751  TROPONINI 0.20*    Glucose  Recent Labs Lab 05/02/16 1707 05/02/16 1954 05/02/16 2338 05/03/16 0322 05/03/16 0835 05/03/16 1152  GLUCAP 78 111* 106* 116* 142* 116*    Imaging Dg Chest Port 1 View  Result Date: 05/04/2016 CLINICAL DATA:  Chest x-ray.  Respiratory failure. EXAM: PORTABLE CHEST 1 VIEW COMPARISON:  Chest radiograph 05/03/2016. FINDINGS: Monitoring leads overlie the patient. Patient is rotated. Stable enlarged cardiac and mediastinal contours. Layering bilateral pleural effusions, right-greater-than-left with underlying consolidative opacities. No pneumothorax. IMPRESSION: Cardiomegaly. Grossly unchanged layering bilateral pleural effusions with underlying opacities. Electronically Signed   By: Lovey Newcomer M.D.   On: 05/04/2016 07:19     STUDIES:  12/22/15 CT angio Chest>>No acute abnormality. No evidence of pulmonary emboli  123456 echo>> Diastolic dysfunction- [id number]  CULTURES: Stool 12/7 > C diff positive  ANTIBIOTICS: Vanc 12/7  SIGNIFICANT EVENTS: 12/6>> Patient transferred  to ICU unresponsive and hypoxemic, required emergent intubation 12/8>extubated to bipap 12/9>tolerating 3-5L of [place]  LINES/TUBES: none   ASSESSMENT / PLAN:  PULMONARY A: Acute On chronic  Respiratory Failure COPD exacerbation Bilateral Pleural effussion P:   Tolerated bipap yesterday, now on 5L [place], wean as tolerated.  Bronchodilators Pulmonary hygiene   CARDIOVASCULAR A:  Diastolic Dysfunction grade -2 Elevated troponin possibly due to demand ischemia Acute systolic CHF.  Afib with RVR P:  Continuous Telemetry neosynephrine-- off.  ECHO 05/02/16; EF=30% Keep MAP goals>60 Currently on [place] Cardiology consulted  RENAL A:   Acute Renal failure P:   Nephrology  consulted Replace electrolytes per ICU protocol Avoid nephrotoxic drugs  GASTROINTESTINAL A:   Upper GI bleed Cdiff P: Continue Protonix GI plan for EGD when more stable, given that H/H has been stable.  Cont with cdiff treatment with vanc HEMATOLOGIC A:   No active issues P:  H/H stable Transfuse if Hgb<7  INFECTIOUS A:   ?CAP P:   procalcitonin 1.71 Azithromycin X5 days  ENDOCRINE A:   No active issues P:   Monitor BS intermittently with BMP  NEUROLOGIC A:   Anxiety P:   RASS goal: 0 to -1 Continue Alprazolam Frequent reorientation Lights on during day   Thank you for consulting [facility], we will signoff at this time.  Please feel free to contact us with any questions at [phone] (please enter 7-digits).    I have personally obtained a history, examined the patient, evaluated laboratory and imaging results, formulated the assessment and plan and placed orders. CRITICAL CARE: The patient is critically ill with multiple organ systems failure and requires high complexity decision making for assessment and support, frequent evaluation and titration of therapies, application of advanced monitoring technologies and extensive interpretation of multiple databases. Critical Care Time devoted to patient care services described in this note is 40 minutes.    Vilinda Boehringer, MD [facility] Pager [phone] (please enter 7-digits) On Call Pager [phone] (please enter 7-digits)      05/04/2016, 8:53 AM   Critical Care Attestation.  I have personally obtained a history, examined the patient, evaluated laboratory and imaging results, formulated the assessment and plan and placed orders. The Patient requires high complexity decision making for assessment and support, frequent evaluation and titration of therapies, application of advanced monitoring technologies and extensive interpretation of multiple  databases. The patient has critical illness that could lead imminently to failure of 1 or more organ systems and requires the highest level of physician preparedness to intervene.  Critical Care Time devoted to patient care services described in this note is 45 minutes and is exclusive of time spent in procedures.

## 2016-05-04 NOTE — Progress Notes (Signed)
Sent UA to lab, only have 10 ml out. Just received 80 IV Lasix per nephrology order. Tried to wean off O2 5L to 3L, sats dropped to 88%. Back on 5L o2.

## 2016-05-04 NOTE — Progress Notes (Addendum)
Spoke with Dr. Juleen China that pt put out 10 ml urine in [place] before lasix and only 50 ml output after up to this point today. Complaining of some abdominal pain. Was going to give Tylenol suppository instead of po but has rectal tube. MD will order US abdomen (complete) and will start pt on Lasix gtt. GI saw pt this afternoon and informed MD of the abdominal distention. Hospitalist noted distention as well this AM.

## 2016-05-04 NOTE — Progress Notes (Signed)
Called [facility] to report results of US abdomen to MD on call. Pt has been taking off nonrebreather, sats drops to 70s. Will try to medicate with xanax.

## 2016-05-04 NOTE — Progress Notes (Signed)
[facility]  ROUNDING NOTE   Subjective:   On 5 L [place] UOP 130  Creatinine 2.37 (2.36)  Objective:  Vital signs in last 24 hours:  Temp:  [97.3 F (36.3 C)-98.4 F (36.9 C)] 97.4 F (36.3 C) (12/09 0400) Pulse Rate:  [42-99] 46 (12/09 0700) Resp:  [13-30] 20 (12/09 0700) BP: (79-111)/(33-71) 111/41 (12/09 0700) SpO2:  [85 %-99 %] 95 % (12/09 0700) Weight:  [56 kg (123 lb 7.3 oz)] 56 kg (123 lb 7.3 oz) (12/09 0500)  Weight change: -0.4 kg (-14.1 oz) Filed Weights   05/02/16 1400 05/03/16 0600 05/04/16 0500  Weight: 56.4 kg (124 lb 5.4 oz) 59.6 kg (131 lb 6.3 oz) 56 kg (123 lb 7.3 oz)    Intake/Output: I/O last 3 completed shifts: In: 3254.4 [P.O.:480; I.V.:426.1; Other:750; NG/GT:598.3; IV Piggyback:1000] Out: [id number] [Urine:180; Stool:475]   Intake/Output this shift:  No intake/output data recorded.  Physical Exam: General: Ill appearing, cachectic  Head:   Moist oral mucosal membranes  Eyes: Anicteric, PERRL  Neck: Supple, trachea midline  Lungs:  Bilateral crackle, left >right  Heart: Regular rate and rhythm  Abdomen:  Soft, nontender, distended  Extremities: + peripheral edema.  Neurologic: Nonfocal, moving all four extremities  Skin: No lesions       Basic Metabolic Panel:  Recent Labs Lab 05/12/2016 0751 05/02/16 0432 05/02/16 1358 05/02/16 1654 05/03/16 0351 05/04/16 0501  NA 137 137  --   --  138 138  K 5.0 5.1  --   --  4.4 4.7  CL 98* 101  --   --  103 104  CO2 27 26  --   --  28 26  GLUCOSE 88 87  --   --  123* 120*  BUN 67* 73*  --   --  76* 80*  CREATININE 2.78* 2.85*  --   --  2.36* 2.37*  CALCIUM 8.0* 7.4*  --   --  7.5* 7.5*  MG  --   --  2.4 2.3 2.3 2.2  PHOS  --   --  4.9* 4.7* 4.7* 4.9*    Liver Function Tests:  Recent Labs Lab 05/05/2016 0751  AST 34  ALT 29  ALKPHOS 170*  BILITOT 0.5  PROT 6.3*  ALBUMIN 3.0*   No results for input(s): LIPASE, AMYLASE in the last 168 hours. No results for input(s): AMMONIA in  the last 168 hours.  CBC:  Recent Labs Lab 05/04/2016 0751 05/02/16 0432 05/03/16 0351 05/04/16 0501  WBC 12.1* 11.2* 8.4 19.2*  NEUTROABS 11.1*  --   --   --   HGB 10.6* 10.2* 10.5* 11.3*  HCT 33.8* 32.7* 33.0* 37.1  MCV 83.6 81.9 82.0 83.6  PLT 247 239 262 217    Cardiac Enzymes:  Recent Labs Lab 05/25/2016 0751  TROPONINI 0.20*    BNP: Invalid input(s): POCBNP  CBG:  Recent Labs Lab 05/02/16 1954 05/02/16 2338 05/03/16 0322 05/03/16 0835 05/03/16 1152  GLUCAP 111* 106* 116* 142* 116*    Microbiology: Results for orders placed or performed during the hospital encounter of 04/27/2016  MRSA PCR Screening     Status: None   Collection Time: 05/02/2016 11:52 PM  Result Value Ref Range Status   MRSA by PCR NEGATIVE NEGATIVE Final    Comment:        The GeneXpert MRSA Assay (FDA approved for NASAL specimens only), is one component of a comprehensive MRSA colonization surveillance program. It is not intended to diagnose MRSA  infection nor to guide or monitor treatment for MRSA infections.   C difficile quick screen w PCR reflex     Status: Abnormal   Collection Time: 05/02/16 10:02 AM  Result Value Ref Range Status   C Diff antigen POSITIVE (A) NEGATIVE Final   C Diff toxin POSITIVE (A) NEGATIVE Final   C Diff interpretation Toxin producing C. difficile detected.  Final    Comment: CRITICAL RESULT CALLED TO, READ BACK BY AND VERIFIED WITH: RENEE BABB 05/02/16 @ 2052  [place]     Coagulation Studies: No results for input(s): LABPROT, INR in the last 72 hours.  Urinalysis:  Recent Labs  05/24/2016 1435  COLORURINE BROWN  LABSPEC 1.040*  PHURINE TEST NOT REPORTED DUE TO COLOR INTERFERENCE OF URINE PIGMENT  GLUCOSEU TEST NOT REPORTED DUE TO COLOR INTERFERENCE OF URINE PIGMENT*  HGBUR TEST NOT REPORTED DUE TO COLOR INTERFERENCE OF URINE PIGMENT*  BILIRUBINUR TEST NOT REPORTED DUE TO COLOR INTERFERENCE OF URINE PIGMENT*  KETONESUR TEST NOT REPORTED DUE TO  COLOR INTERFERENCE OF URINE PIGMENT*  PROTEINUR TEST NOT REPORTED DUE TO COLOR INTERFERENCE OF URINE PIGMENT*  NITRITE TEST NOT REPORTED DUE TO COLOR INTERFERENCE OF URINE PIGMENT*  LEUKOCYTESUR TEST NOT REPORTED DUE TO COLOR INTERFERENCE OF URINE PIGMENT*      Imaging: Dg Chest 1 View  Result Date: 05/03/2016 CLINICAL DATA:  Dyspnea.  Respiratory failure EXAM: CHEST 1 VIEW COMPARISON:  05/22/2016 FINDINGS: Endotracheal tube is 4.1 cm above the carina. Nasogastric tube extends well into the stomach. Pleural effusions and adjacent basilar opacities persist bilaterally. IMPRESSION: 1.  Support equipment appears satisfactorily positioned. 2. No significant interval change in the bilateral airspace opacities and effusions. Electronically Signed   By: Andreas Newport M.D.   On: 05/03/2016 06:20   Dg Chest Port 1 View  Result Date: 05/04/2016 CLINICAL DATA:  Chest x-ray.  Respiratory failure. EXAM: PORTABLE CHEST 1 VIEW COMPARISON:  Chest radiograph 05/03/2016. FINDINGS: Monitoring leads overlie the patient. Patient is rotated. Stable enlarged cardiac and mediastinal contours. Layering bilateral pleural effusions, right-greater-than-left with underlying consolidative opacities. No pneumothorax. IMPRESSION: Cardiomegaly. Grossly unchanged layering bilateral pleural effusions with underlying opacities. Electronically Signed   By: Lovey Newcomer M.D.   On: 05/04/2016 07:19     Medications:   . amiodarone 30 mg/hr (05/04/16 0855)  . fentaNYL infusion INTRAVENOUS 40 mcg/hr (05/12/2016 2230)   . fentaNYL (SUBLIMAZE) injection  50 mcg Intravenous Once  . furosemide  80 mg Intravenous Once  . pantoprazole (PROTONIX) IV  40 mg Intravenous Q12H  . sodium chloride flush  3 mL Intravenous Q12H  . vancomycin  125 mg Oral Q6H   acetaminophen **OR** acetaminophen, ALPRAZolam, diphenhydrAMINE, fentaNYL, ipratropium-albuterol, ondansetron **OR** ondansetron (ZOFRAN) IV  Assessment/ Plan:  [name] is  a 75 y.o. white female with COPD, CHF, hypertension, gout, who was admitted to [facility] on 05/26/2016   1. Acute Renal Failure: secondary to ATN, hypotension, hypoxia Nonoliguric urine output. Creatinine improving.  - No acute indication for dialysis.  - IV bolus of fursomide 80mg  x 1  2. Acute exacerbation of systolic congestive heart failure with respiratory failure on mechanical ventilation. Concurrent acute exacerbation of COPD Extubated 12/8. Placed on bipap overnight - IV furosemide as above.   3. Acute GI Bleed with anemia: hemoglobin remains stable.   4. C Diff colitis: with fever and leukocytosis.  PO vanco   LOS: [place], Jacqueline Boyd 12/9/20179:47 AM

## 2016-05-04 NOTE — Progress Notes (Signed)
Gastroenterology Progress Note    Jacqueline Boyd 75 y.o. 1941-05-15   Subjective: Resting comfortably in bed. Denies abdominal pain. Brown stool in rectal tube. O2 sats dropped into 80's on 5L so patient now on non-rebreather mask.  Objective: Vital signs in last 24 hours: Vitals:   05/04/16 1610 05/04/16 1700  BP:  (!) 93/58  Pulse: (!) 132 (!) 131  Resp: 14 17  Temp: 98.2     Physical Exam: Gen: somnolent, elderly, frail, thin, no acute distress CV: RRR Chest: Coarse breath sounds Abd: distended, nontender, +BS Ext: no edema  Lab Results:  Recent Labs  05/03/16 0351 05/04/16 0501  NA 138 138  K 4.4 4.7  CL 103 104  CO2 28 26  GLUCOSE 123* 120*  BUN 76* 80*  CREATININE 2.36* 2.37*  CALCIUM 7.5* 7.5*  MG 2.3 2.2  PHOS 4.7* 4.9*   No results for input(s): AST, ALT, ALKPHOS, BILITOT, PROT, ALBUMIN in the last 72 hours.  Recent Labs  05/03/16 0351 05/04/16 0501  WBC 8.4 19.2*  HGB 10.5* 11.3*  HCT 33.0* 37.1  MCV 82.0 83.6  PLT 262 217   No results for input(s): LABPROT, INR in the last 72 hours.    Assessment/Plan: Melena prior to admit that has resolved. Stable Hgb. Consider EGD when respiratory status stable. Continue Protonix and supportive care.   [place] C. 05/04/2016, 5:04 PM

## 2016-05-04 NOTE — Progress Notes (Signed)
[facility] [place] NAME: Jacqueline Boyd    MRN#:  [id number]  DATE OF BIRTH:  Jan 04, 1941  SUBJECTIVE:  Hospital Day: 3 days Jacqueline Boyd is a 75 y.o. female presenting with Melena   Overnight events: No overnight events Interval Events: diarrhea improved. Noted to a. Fib w/ RVR and started on Amio gtt overnight.  Remains lethargic.    REVIEW OF SYSTEMS:  REVIEW OF SYSTEMS:  CONSTITUTIONAL: Denies fevers, chills, Positive fatigue, weakness.  EYES: Denies blurred vision, double vision, or eye pain.  EARS, NOSE, THROAT: Denies tinnitus, ear pain, hearing loss.  RESPIRATORY: denies cough, shortness of breath, wheezing  CARDIOVASCULAR: Denies chest pain, palpitations, positive edema.  GASTROINTESTINAL: Denies nausea, vomiting, positive diarrhea, denies abdominal pain.  GENITOURINARY: Denies dysuria, hematuria.  ENDOCRINE: Denies nocturia or thyroid problems. HEMATOLOGIC AND LYMPHATIC: Denies easy bruising or bleeding.  SKIN: Denies rash or lesions.  MUSCULOSKELETAL: Denies pain in neck, back, shoulder, knees, hips, or further arthritic symptoms.  NEUROLOGIC: Denies paralysis, paresthesias.  PSYCHIATRIC: Denies anxiety or depressive symptoms. Otherwise full review of systems performed by me is negative.   DRUG ALLERGIES:   Allergies  Allergen Reactions  . Codeine Other (See Comments)    Itching and "feels like worms crawling in body".  . Penicillins Rash    Has patient had a PCN reaction causing immediate rash, facial/tongue/throat swelling, SOB or lightheadedness with hypotension: No  Has patient had a PCN reaction causing severe rash involving mucus membranes or skin necrosis: No Has patient had a PCN reaction that required hospitalization: No   Has patient had a PCN reaction occurring within the last 10 years: No  If all of the above answers are "NO", then may proceed with Cephalosporin use.   . Prednisone Palpitations     VITALS:  Blood pressure 93/60, pulse (!) 130, temperature 97.7 F (36.5 C), temperature source Oral, resp. rate (!) 21, height 5\' 5"  (1.651 m), weight 56 kg (123 lb 7.3 oz), SpO2 92 %.  PHYSICAL EXAMINATION:   VITAL SIGNS: Vitals:   05/04/16 1200 05/04/16 1300  BP: 98/63 93/60  Pulse: 65 (!) 130  Resp: 20 (!) 21  Temp:     GENERAL:75 y.o.female currently in no acute distress. Extubated chronically ill-appearing HEAD: Normocephalic, atraumatic.  EYES: Pupils equal, round, reactive to light. Extraocular muscles intact. No scleral icterus.  MOUTH: Dry mucosal membrane. Dentition intact. No abscess noted.  EAR, NOSE, THROAT: Clear without exudates. No external lesions.  NECK: Supple. No thyromegaly. No nodules. No JVD.  PULMONARY: Diminished without wheeze rails or rhonci. No use of accessory muscles, Good respiratory effort. good air entry bilaterally CHEST: Nontender to palpation.  CARDIOVASCULAR: S1 and S2. Regular rate and rhythm. No murmurs, rubs, or gallops. No edema. Pedal pulses 2+ bilaterally.  GASTROINTESTINAL: Soft, nontender, distended. No masses. Hypoactive bowel sounds. No hepatosplenomegaly.  MUSCULOSKELETAL: No swelling, clubbing, or edema. Range of motion full in all extremities.  NEUROLOGIC: Cranial nerves II through XII are intact. No gross focal neurological deficits. Sensation intact. Reflexes intact.  Globally weak. SKIN: No ulceration, lesions, rashes, or cyanosis. Skin warm and dry. Turgor intact.  PSYCHIATRIC: Mood, affect within normal limits. The patient is awake, alert and oriented x 1.       LABORATORY PANEL:   CBC  Recent Labs Lab 05/04/16 0501  WBC 19.2*  HGB 11.3*  HCT 37.1  PLT 217   ------------------------------------------------------------------------------------------------------------------  Chemistries   Recent Labs Lab 05/07/2016 0751  05/04/16 0501  NA 137  < > 138  K 5.0  < > 4.7  CL 98*  < > 104  CO2 27  < > 26   GLUCOSE 88  < > 120*  BUN 67*  < > 80*  CREATININE 2.78*  < > 2.37*  CALCIUM 8.0*  < > 7.5*  MG  --   < > 2.2  AST 34  --   --   ALT 29  --   --   ALKPHOS 170*  --   --   BILITOT 0.5  --   --   < > = values in this interval not displayed. ------------------------------------------------------------------------------------------------------------------  Cardiac Enzymes  Recent Labs Lab 05/04/2016 0751  TROPONINI 0.20*   ------------------------------------------------------------------------------------------------------------------  RADIOLOGY:  Dg Chest 1 View  Result Date: 05/03/2016 CLINICAL DATA:  Dyspnea.  Respiratory failure EXAM: CHEST 1 VIEW COMPARISON:  05/22/2016 FINDINGS: Endotracheal tube is 4.1 cm above the carina. Nasogastric tube extends well into the stomach. Pleural effusions and adjacent basilar opacities persist bilaterally. IMPRESSION: 1.  Support equipment appears satisfactorily positioned. 2. No significant interval change in the bilateral airspace opacities and effusions. Electronically Signed   By: Andreas Newport M.D.   On: 05/03/2016 06:20   Dg Chest Port 1 View  Result Date: 05/04/2016 CLINICAL DATA:  Chest x-ray.  Respiratory failure. EXAM: PORTABLE CHEST 1 VIEW COMPARISON:  Chest radiograph 05/03/2016. FINDINGS: Monitoring leads overlie the patient. Patient is rotated. Stable enlarged cardiac and mediastinal contours. Layering bilateral pleural effusions, right-greater-than-left with underlying consolidative opacities. No pneumothorax. IMPRESSION: Cardiomegaly. Grossly unchanged layering bilateral pleural effusions with underlying opacities. Electronically Signed   By: Lovey Newcomer M.D.   On: 05/04/2016 07:19    EKG:   Orders placed or performed during the hospital encounter of 05/19/2016  . EKG 12-Lead  . EKG 12-Lead    ASSESSMENT AND PLAN:   Jacqueline Boyd is a 75 y.o. female presenting with Melena . Admitted 05/03/2016 : Day #: 3 days   1. C.  Difficile diarrhea: cont. Flexaseal.  Cont. Oral Vanc and improving and will monitor. : - cont. Enteric precautions.   2. Upper GI bleed: Hemoglobin stable.  No acute bleeding presently.  - appreciate GI input. Cont. Protonix.  No plans for intervention.   3. Acute on chronic diastolic congestive heart failure: Cardiology input appreciated,  - improved w/ intermitted diuresis.  As per Nephro given one dose of Lasix iV today.   - follow response.   4. ARF - secondary to ATN, hypotension. Appreciate nephrology input -Nonoliguric urine output. No acute indication for dialysis presently. IV Lasix dosed 1 and will follow response.  5. Acute on chronic respiratory failure with hypoxia: extubated 2 days ago.  - cont. O2 supplementation.    6. Leukocytosis - due to C. Diff diarrhea.  - will follow WBC Count.   7. A. Fib w/ RVR - was started amio gtt overnight.  Appreciate cards input and started on Oral Amio and will wean off gtt today.   All the records are reviewed and case discussed with Care Management/Social Workerr. Management plans discussed with the patient, family and they are in agreement.  CODE STATUS: Partial, no CPR  TOTAL TIME TAKING CARE OF THIS PATIENT: 30 minutes.   POSSIBLE D/C IN 2-3 DAYS, DEPENDING ON CLINICAL CONDITION.   Jacqueline Boyd M.D on 05/04/2016 at 2:06 PM  Between 7am to 6pm - Pager - [phone]  After 6pm: House Pager: - [phone]  Jacqueline Boyd  Hospitalists  Office  [phone]  CC: Primary care physician; Marinda Elk, MD

## 2016-05-04 NOTE — Progress Notes (Signed)
Warren Lacy MD returned call and Dr. Stevenson Clinch signed off patient today. Will notify Hospitalist. MD said there was sludge, but nothing for concern at this time.

## 2016-05-04 NOTE — Progress Notes (Signed)
Spoke with cardiologist that pt is still on amiodarone gtt, Dr. Tor Netters wants to know if pt can be transitioned to po in anticipation of being moved off ICU. Pt HR has been sustaining in 120s, and BP low, but pt did receive within past hour 80mg  IV Lasix. MD ordered to begin po amiodarone and turn off gtt 1 hour after first dose administered.

## 2016-05-05 ENCOUNTER — Inpatient Hospital Stay: Payer: Commercial Managed Care - HMO

## 2016-05-05 DIAGNOSIS — R140 Abdominal distension (gaseous): Secondary | ICD-10-CM

## 2016-05-05 DIAGNOSIS — K567 Ileus, unspecified: Secondary | ICD-10-CM

## 2016-05-05 LAB — CBC
HCT: 41.3 % (ref 35.0–47.0)
Hemoglobin: 12.3 g/dL (ref 12.0–16.0)
MCH: 25 pg — ABNORMAL LOW (ref 26.0–34.0)
MCHC: 29.9 g/dL — ABNORMAL LOW (ref 32.0–36.0)
MCV: 83.8 fL (ref 80.0–100.0)
PLATELETS: 161 10*3/uL (ref 150–440)
RBC: 4.92 MIL/uL (ref 3.80–5.20)
RDW: 18 % — ABNORMAL HIGH (ref 11.5–14.5)
WBC: 30.1 10*3/uL — AB (ref 3.6–11.0)

## 2016-05-05 LAB — MAGNESIUM: MAGNESIUM: 2.5 mg/dL — AB (ref 1.7–2.4)

## 2016-05-05 LAB — COMPREHENSIVE METABOLIC PANEL
ALT: 43 U/L (ref 14–54)
ANION GAP: 11 (ref 5–15)
AST: 27 U/L (ref 15–41)
Albumin: 2.1 g/dL — ABNORMAL LOW (ref 3.5–5.0)
Alkaline Phosphatase: 140 U/L — ABNORMAL HIGH (ref 38–126)
BUN: 89 mg/dL — ABNORMAL HIGH (ref 6–20)
CHLORIDE: 101 mmol/L (ref 101–111)
CO2: 24 mmol/L (ref 22–32)
CREATININE: 2.71 mg/dL — AB (ref 0.44–1.00)
Calcium: 8.1 mg/dL — ABNORMAL LOW (ref 8.9–10.3)
GFR, EST AFRICAN AMERICAN: 19 mL/min — AB (ref >60)
GFR, EST NON AFRICAN AMERICAN: 16 mL/min — AB (ref >60)
Glucose, Bld: 122 mg/dL — ABNORMAL HIGH (ref 65–99)
POTASSIUM: 5.4 mmol/L — AB (ref 3.5–5.1)
Sodium: 136 mmol/L (ref 135–145)
Total Bilirubin: 0.6 mg/dL (ref 0.3–1.2)
Total Protein: 5.2 g/dL — ABNORMAL LOW (ref 6.5–8.1)

## 2016-05-05 LAB — URINE CULTURE

## 2016-05-05 LAB — LACTIC ACID, PLASMA: LACTIC ACID, VENOUS: 1.5 mmol/L (ref 0.5–1.9)

## 2016-05-05 LAB — TROPONIN I: TROPONIN I: 0.34 ng/mL — AB (ref <0.03)

## 2016-05-05 LAB — PROTIME-INR
INR: 1
PROTHROMBIN TIME: 13.2 s (ref 11.4–15.2)

## 2016-05-05 LAB — PHOSPHORUS: PHOSPHORUS: 5.8 mg/dL — AB (ref 2.5–4.6)

## 2016-05-05 LAB — PROCALCITONIN: PROCALCITONIN: 17.73 ng/mL

## 2016-05-05 MED ORDER — METRONIDAZOLE IN NACL 5-0.79 MG/ML-% IV SOLN
500.0000 mg | Freq: Once | INTRAVENOUS | Status: AC
  Administered 2016-05-05: 500 mg via INTRAVENOUS
  Filled 2016-05-05: qty 100

## 2016-05-05 MED ORDER — METRONIDAZOLE IN NACL 5-0.79 MG/ML-% IV SOLN
500.0000 mg | Freq: Three times a day (TID) | INTRAVENOUS | Status: DC
  Administered 2016-05-05: 500 mg via INTRAVENOUS
  Filled 2016-05-05 (×3): qty 100

## 2016-05-05 MED ORDER — MORPHINE SULFATE (PF) 4 MG/ML IV SOLN
0.5000 mg | Freq: Once | INTRAVENOUS | Status: AC
  Administered 2016-05-05: 0.52 mg via INTRAVENOUS
  Filled 2016-05-05: qty 1

## 2016-05-05 MED ORDER — METRONIDAZOLE 500 MG PO TABS: 500.0000 mg | ORAL_TABLET | Freq: Three times a day (TID) | ORAL | Status: DC

## 2016-05-05 MED ORDER — FENTANYL CITRATE (PF) 100 MCG/2ML IJ SOLN
INTRAMUSCULAR | Status: AC
  Administered 2016-05-05: 25 ug via INTRAVENOUS
  Filled 2016-05-05: qty 2

## 2016-05-05 MED ORDER — SODIUM CHLORIDE 0.9 % IV BOLUS (SEPSIS): 500.0000 mL | INTRAVENOUS | Status: DC

## 2016-05-05 MED ORDER — AMIODARONE LOAD VIA INFUSION
150.0000 mg | Freq: Once | INTRAVENOUS | Status: AC
  Administered 2016-05-05: 150 mg via INTRAVENOUS
  Filled 2016-05-05: qty 83.34

## 2016-05-05 MED ORDER — METOPROLOL SUCCINATE ER 25 MG PO TB24: 25.0000 mg | ORAL_TABLET | Freq: Two times a day (BID) | ORAL | Status: DC

## 2016-05-05 MED ORDER — FENTANYL CITRATE (PF) 100 MCG/2ML IJ SOLN
12.5000 ug | INTRAMUSCULAR | Status: DC | PRN
Start: 2016-05-05 — End: 2016-05-06
  Administered 2016-05-05: 25 ug via INTRAVENOUS
  Administered 2016-05-05: 50 ug via INTRAVENOUS
  Administered 2016-05-05: 25 ug via INTRAVENOUS
  Administered 2016-05-05 – 2016-05-06 (×2): 50 ug via INTRAVENOUS
  Filled 2016-05-05 (×3): qty 2

## 2016-05-05 MED ORDER — SODIUM CHLORIDE 0.9 % IV BOLUS (SEPSIS)
1000.0000 mL | Freq: Once | INTRAVENOUS | Status: AC
  Administered 2016-05-05: 1000 mL via INTRAVENOUS

## 2016-05-05 MED ORDER — VANCOMYCIN 50 MG/ML ORAL SOLUTION
500.0000 mg | Freq: Four times a day (QID) | ORAL | Status: DC
  Administered 2016-05-05: 500 mg via ORAL
  Filled 2016-05-05 (×6): qty 10

## 2016-05-05 NOTE — Progress Notes (Signed)
Pt. Was taken off bipap for a trial and placed on 3 L nasal cannula with a sa02 of 95%.

## 2016-05-05 NOTE — Progress Notes (Signed)
Potassium 5.4. Dr. Stevenson Clinch aware. No new orders at this time.

## 2016-05-05 NOTE — Progress Notes (Signed)
Patient ID: Jacqueline Boyd, female   DOB: 1941/01/18, 75 y.o.   MRN: [id number]  Called by nursing for rapid afib.  Patient reportedly came off amio drip this evening and was given PO amiodarone per cardio.  Patient asymptomatic but remains in afib at 140s and is now hypotensive with BP 80s/50s.  Stat CCM consult placed.

## 2016-05-05 NOTE — Progress Notes (Signed)
[facility] Cardiology  SUBJECTIVE: I don't have chest pain   Vitals:   05/05/16 0700 05/05/16 0800 05/05/16 0900 05/05/16 1000  BP: (!) 102/50 (!) 105/45 (!) 104/51 (!) 106/46  Pulse: (!) 121 (!) 120 (!) 121 (!) 119  Resp: 13 12 17 13   Temp:  97.9 F (36.6 C)    TempSrc:  Axillary    SpO2: 97% 99% 97% 100%  Weight:      Height:         Intake/Output Summary (Last 24 hours) at 05/05/16 1106 Last data filed at 05/05/16 0800  Gross per 24 hour  Intake          2277.25 ml  Output              445 ml  Net          1832.25 ml      PHYSICAL EXAM  General: Acutely-year-old appearing female in mild-to-moderate distress HEENT:  Normocephalic and atramatic Neck:  No JVD.  Lungs: Clear bilaterally to auscultation and percussion. Heart: Irregularly irregular rhythm Abdomen: Bowel sounds are positive, abdomen soft and non-tender  Msk:  Back normal, normal gait. Normal strength and tone for age. Extremities: No clubbing, cyanosis or edema.   Neuro: Lethargic Psych:  Lethargic   LABS: Basic Metabolic Panel:  Recent Labs  05/04/16 0501 05/05/16 0254  NA 138 136  K 4.7 5.4*  CL 104 101  CO2 26 24  GLUCOSE 120* 122*  BUN 80* 89*  CREATININE 2.37* 2.71*  CALCIUM 7.5* 8.1*  MG 2.2 2.5*  PHOS 4.9* 5.8*   Liver Function Tests:  Recent Labs  05/05/16 0254  AST 27  ALT 43  ALKPHOS 140*  BILITOT 0.6  PROT 5.2*  ALBUMIN 2.1*   No results for input(s): LIPASE, AMYLASE in the last 72 hours. CBC:  Recent Labs  05/04/16 0501 05/05/16 0254  WBC 19.2* 30.1*  HGB 11.3* 12.3  HCT 37.1 41.3  MCV 83.6 83.8  PLT 217 161   Cardiac Enzymes:  Recent Labs  05/05/16 0254  TROPONINI 0.34*   BNP: Invalid input(s): POCBNP D-Dimer: No results for input(s): DDIMER in the last 72 hours. Hemoglobin A1C: No results for input(s): HGBA1C in the last 72 hours. Fasting Lipid Panel: No results for input(s): CHOL, HDL, LDLCALC, TRIG, CHOLHDL, LDLDIRECT in the last 72  hours. Thyroid Function Tests: No results for input(s): TSH, T4TOTAL, T3FREE, THYROIDAB in the last 72 hours.  Invalid input(s): FREET3 Anemia Panel: No results for input(s): VITAMINB12, FOLATE, FERRITIN, TIBC, IRON, RETICCTPCT in the last 72 hours.  Dg Abd 1 View  Result Date: 05/05/2016 CLINICAL DATA:  Abdominal distention. EXAM: ABDOMEN - 1 VIEW COMPARISON:  Radiograph 05/22/2016 FINDINGS: Mild gaseous distention of small bowel loops in the central abdomen measure up to 3.3 cm. Central positioning of small bowel may be secondary to ascites. No definite formed stool is seen. No evidence of free air. Irregular calcifications in the right mid abdomen, of uncertain etiology. Chronic change about the left hip, post right hip replacement. IMPRESSION: Mild gaseous distention of central small bowel loops, pattern suggests ileus. Irregular calcifications in the right mid abdomen, of uncertain significance. Electronically Signed   By: Jeb Levering M.D.   On: 05/05/2016 03:34   US Abdomen Complete  Result Date: 05/04/2016 CLINICAL DATA:  Abdominal distention and acute renal failure. EXAM: ABDOMEN ULTRASOUND COMPLETE COMPARISON:  None. FINDINGS: Gallbladder: There is a small amount of sludge in the gallbladder. The gallbladder wall is thickened  is 6 mm. No stones or Murphy's sign. Common bile duct: Diameter: 4.2 mm. Liver: Nodular contour to the liver. Possible intrahepatic ductal dilatation. IVC: No abnormality visualized. Pancreas: Visualized portion unremarkable. Spleen: Size and appearance within normal limits. Right Kidney: Length: 7.4 cm.  Atrophic with increased echogenicity. Left Kidney: Length: 11.1 cm. Echogenicity within normal limits. No mass or hydronephrosis visualized. Abdominal aorta: No aneurysm visualized. Other findings: Moderate ascites and bilateral pleural effusions. IMPRESSION: 1. Nodular liver and ascites suggesting cirrhosis.  No splenomegaly. 2. Suggested intrahepatic ductal  dilatation. 3. There is small amount of sludge in the gallbladder. There is marked wall thickening but no stones or Murphy's sign. A HIDA scan could further evaluate if there is concern. 4. Atrophic right kidney. 5. Moderate ascites. 6. The sonographer described dilated bowel. This was not well seen on the KUB from 3 days ago. The images provided today are not convincing. Recommend a KUB to evaluate for bowel distention as clinically warranted. Electronically Signed   By: Dorise Bullion III M.D   On: 05/04/2016 18:07   Dg Chest Port 1 View  Result Date: 05/05/2016 CLINICAL DATA:  Acute respiratory failure. EXAM: PORTABLE CHEST 1 VIEW COMPARISON:  Radiographs yesterday at 0543 hour FINDINGS: Moderate to large bilateral pleural effusions, increasing on the right. Associated bibasilar opacities. Unchanged cardiomediastinal contours with cardiomegaly and atherosclerosis. Vascular congestion and mild pulmonary edema, stable. No pneumothorax. IMPRESSION: Moderate to large pleural effusions, with mild increase on the right. Vascular congestion and probable perihilar edema. Electronically Signed   By: Jeb Levering M.D.   On: 05/05/2016 03:36   Dg Chest Port 1 View  Result Date: 05/04/2016 CLINICAL DATA:  Chest x-ray.  Respiratory failure. EXAM: PORTABLE CHEST 1 VIEW COMPARISON:  Chest radiograph 05/03/2016. FINDINGS: Monitoring leads overlie the patient. Patient is rotated. Stable enlarged cardiac and mediastinal contours. Layering bilateral pleural effusions, right-greater-than-left with underlying consolidative opacities. No pneumothorax. IMPRESSION: Cardiomegaly. Grossly unchanged layering bilateral pleural effusions with underlying opacities. Electronically Signed   By: Lovey Newcomer M.D.   On: 05/04/2016 07:19     Echo mildly reduced left ventricular function with LV ejection fraction of 3035%  TELEMETRY: Atrial fibrillation with a rapid ventricular rate:  ASSESSMENT AND PLAN:  Active Problems:    Acute renal failure (ARF) (HCC)   Acute on chronic diastolic CHF (congestive heart failure) ([place])   Upper GI bleed   Enteritis due to Clostridium difficile    1. Atrial fibrillation with a rapid ventricular rate, exacerbated by underlying acute on chronic respiratory failure, GI bleed, C. 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 Moderate cardiomyopathy, of unknown etiology 3. COPD exacerbation 4. GI bleeding 5. C. difficile colitis 6. Acute kidney injury  Recommendations  1. Continue amiodarone drip for now 2. Start metoprolol succinate 25 mg twice a day for control of tachycardia, as well as, underlying moderate cardiomyopathy 3. Defer chronic anticoagulation, in light of GI bleeding and C. difficile colitis   Isaias Cowman, MD, PhD, [facility] 05/05/2016 11:06 AM

## 2016-05-05 NOTE — Progress Notes (Signed)
Patient continues to tachycardia in the 130s hospitalist notified will continue to monitor.

## 2016-05-05 NOTE — Progress Notes (Signed)
Pt's family called the unit for an update. Once the family was informed that the pt was on bi-pap at this time, the family stated they did not want pt on the bi-pap and refused any further ABG blood draws. Dr. Verdell Carmine contacted reference comfort orders; Dr. Verdell Carmine stated that he did not have a rapport with the family and would not make the pt comfort care. Dr. Verdell Carmine stated that Dr. Stevenson Clinch could make the pt comfort care if he wishes. Dr. Stevenson Clinch contacted reference same. Dr. Stevenson Clinch stated he would meet with the family at 0930 tomorrow morning. In the meantime, Dr. Stevenson Clinch stated to leave the bi-pap off  as long as the pt can tolerate the [place]. Will continue to monitor.

## 2016-05-05 NOTE — Progress Notes (Signed)
[facility] NAME: Jacqueline Boyd    MRN#:  [id number]  DATE OF BIRTH:  June 07, 1940  SUBJECTIVE:  Hospital Day: 4 days Jacqueline Boyd is a 75 y.o. female presenting with Melena   Overnight events: Went in rapid a.fib w/ RVR and became hypotensive. Given fluids and started on Amio gtt again. BP improved.   Cont. To have distended abd.  WBC count up to 30.  Noted to be in acute resp. Failure with Hypercarbia this a.m.    REVIEW OF SYSTEMS:  REVIEW OF SYSTEMS:   Unable to obtain due to encephalopathy.     DRUG ALLERGIES:   Allergies  Allergen Reactions  . Codeine Other (See Comments)    Itching and "feels like worms crawling in body".  . Penicillins Rash    Has patient had a PCN reaction causing immediate rash, facial/tongue/throat swelling, SOB or lightheadedness with hypotension: No  Has patient had a PCN reaction causing severe rash involving mucus membranes or skin necrosis: No Has patient had a PCN reaction that required hospitalization: No   Has patient had a PCN reaction occurring within the last 10 years: No  If all of the above answers are "NO", then may proceed with Cephalosporin use.   . Prednisone Palpitations    VITALS:  Blood pressure (!) 106/46, pulse (!) 119, temperature 97.9 F (36.6 C), temperature source Axillary, resp. rate 13, height 5\' 5"  (1.651 m), weight 57.1 kg (125 lb 14.1 oz), SpO2 100 %.  PHYSICAL EXAMINATION:   VITAL SIGNS: Vitals:   05/05/16 0900 05/05/16 1000  BP: (!) 104/51 (!) 106/46  Pulse: (!) 121 (!) 119  Resp: 17 13  Temp:     GENERAL:75 y.o.female currently encephalopathic and critically ill appearing.  HEAD: Normocephalic, atraumatic.  EYES: PERRL. No scleral icterus.  MOUTH: Dry mucosal membrane. Dentition intact. No abscess noted.  EAR, NOSE, THROAT: Clear without exudates. No external lesions.  NECK: Supple. No thyromegaly. No nodules. No JVD.  PULMONARY: Poor Resp.  Effort, no rales, rhonchi, wheezes CHEST: Nontender to palpation.  CARDIOVASCULAR: S1 and S2. Irregular rhythm and tachycardic. No murmurs, rubs, or gallops. No edema. Pedal pulses 2+ bilaterally.  GASTROINTESTINAL: Soft, nontender, distended. Hypoactive bowel sounds. No hepatosplenomegaly.  MUSCULOSKELETAL: No swelling, clubbing, or edema. Range of motion full in all extremities.  NEUROLOGIC: Encephalopathic and dose not follow commands.  Difficult to do a good Neuro exam. SKIN: No ulceration, lesions, rashes, or cyanosis. Skin warm and dry. Turgor intact.  PSYCHIATRIC: Mood, affect within normal limits. The patient is awake, alert and oriented x 1.       LABORATORY PANEL:   CBC  Recent Labs Lab 05/05/16 0254  WBC 30.1*  HGB 12.3  HCT 41.3  PLT 161   ------------------------------------------------------------------------------------------------------------------  Chemistries   Recent Labs Lab 05/05/16 0254  NA 136  K 5.4*  CL 101  CO2 24  GLUCOSE 122*  BUN 89*  CREATININE 2.71*  CALCIUM 8.1*  MG 2.5*  AST 27  ALT 43  ALKPHOS 140*  BILITOT 0.6   ------------------------------------------------------------------------------------------------------------------  Cardiac Enzymes  Recent Labs Lab 05/05/16 0254  TROPONINI 0.34*   ------------------------------------------------------------------------------------------------------------------  RADIOLOGY:  Dg Abd 1 View  Result Date: 05/05/2016 CLINICAL DATA:  Abdominal distention. EXAM: ABDOMEN - 1 VIEW COMPARISON:  Radiograph 05/25/2016 FINDINGS: Mild gaseous distention of small bowel loops in the central abdomen measure up to 3.3 cm. Central positioning of small bowel may be secondary to ascites. No  definite formed stool is seen. No evidence of free air. Irregular calcifications in the right mid abdomen, of uncertain etiology. Chronic change about the left hip, post right hip replacement. IMPRESSION: Mild  gaseous distention of central small bowel loops, pattern suggests ileus. Irregular calcifications in the right mid abdomen, of uncertain significance. Electronically Signed   By: Jeb Levering M.D.   On: 05/05/2016 03:34   US Abdomen Complete  Result Date: 05/04/2016 CLINICAL DATA:  Abdominal distention and acute renal failure. EXAM: ABDOMEN ULTRASOUND COMPLETE COMPARISON:  None. FINDINGS: Gallbladder: There is a small amount of sludge in the gallbladder. The gallbladder wall is thickened is 6 mm. No stones or Murphy's sign. Common bile duct: Diameter: 4.2 mm. Liver: Nodular contour to the liver. Possible intrahepatic ductal dilatation. IVC: No abnormality visualized. Pancreas: Visualized portion unremarkable. Spleen: Size and appearance within normal limits. Right Kidney: Length: 7.4 cm.  Atrophic with increased echogenicity. Left Kidney: Length: 11.1 cm. Echogenicity within normal limits. No mass or hydronephrosis visualized. Abdominal aorta: No aneurysm visualized. Other findings: Moderate ascites and bilateral pleural effusions. IMPRESSION: 1. Nodular liver and ascites suggesting cirrhosis.  No splenomegaly. 2. Suggested intrahepatic ductal dilatation. 3. There is small amount of sludge in the gallbladder. There is marked wall thickening but no stones or Murphy's sign. A HIDA scan could further evaluate if there is concern. 4. Atrophic right kidney. 5. Moderate ascites. 6. The sonographer described dilated bowel. This was not well seen on the KUB from 3 days ago. The images provided today are not convincing. Recommend a KUB to evaluate for bowel distention as clinically warranted. Electronically Signed   By: Dorise Bullion III M.D   On: 05/04/2016 18:07   Dg Chest Port 1 View  Result Date: 05/05/2016 CLINICAL DATA:  Acute respiratory failure. EXAM: PORTABLE CHEST 1 VIEW COMPARISON:  Radiographs yesterday at 0543 hour FINDINGS: Moderate to large bilateral pleural effusions, increasing on the right.  Associated bibasilar opacities. Unchanged cardiomediastinal contours with cardiomegaly and atherosclerosis. Vascular congestion and mild pulmonary edema, stable. No pneumothorax. IMPRESSION: Moderate to large pleural effusions, with mild increase on the right. Vascular congestion and probable perihilar edema. Electronically Signed   By: Jeb Levering M.D.   On: 05/05/2016 03:36   Dg Chest Port 1 View  Result Date: 05/04/2016 CLINICAL DATA:  Chest x-ray.  Respiratory failure. EXAM: PORTABLE CHEST 1 VIEW COMPARISON:  Chest radiograph 05/03/2016. FINDINGS: Monitoring leads overlie the patient. Patient is rotated. Stable enlarged cardiac and mediastinal contours. Layering bilateral pleural effusions, right-greater-than-left with underlying consolidative opacities. No pneumothorax. IMPRESSION: Cardiomegaly. Grossly unchanged layering bilateral pleural effusions with underlying opacities. Electronically Signed   By: Lovey Newcomer M.D.   On: 05/04/2016 07:19    EKG:   Orders placed or performed during the hospital encounter of 04/30/2016  . EKG 12-Lead  . EKG 12-Lead  . EKG 12-Lead  . EKG 12-Lead    ASSESSMENT AND PLAN:   Jacqueline Boyd is a 75 y.o. female presenting with Melena . Admitted 04/29/2016 : Day #: 4 days   1. Sepsis - due to C. Diff colitis.  - cont. Treatment of colitis w/ Oral Vanco, Flagyl and monitor.  - hemodynamically stable and not on pressors.   2. C. Difficile diarrhea: cont. Flexaseal.  Cont. Vanc, Flagyl and abdo. Is quite distended and X-ray this a.m. Showing an ileus.  - prognosis is poor.   3. Acute Resp. Failure w/ Hypercarbia - pt. Lethargic/encephalopathic this a.m.  - ABG showing hypercarbia.  Will place  on Bipap and monitor.  Pulm. Following and agree with management.  - likely due to underlying sepsis, ARF.   4. Upper GI bleed: Hemoglobin stable.  No acute bleeding presently.  - appreciate GI input. Cont. Protonix.  No plans for intervention.   5. ARF -  secondary to ATN, hypotension. Appreciate nephrology input -Nonoliguric urine output. Did not respond to diuretics.  Now Hyperkalemic.  - Nephrology discussed w/ son and no plans for HD and cont. Supportive care for now.   6. Leukocytosis - due to C. Diff diarrhea.  - WBC count trending up and due to sepsis and will cont. To monitor.   7. A. Fib w/ RVR - restarted back on Amio gtt overnight.  Appreciate Cards input and started on low dose Oral Metoprolol if tolerated.  - defer anticoagulation due to GI bleed and critical illness for now.   Prognosis is very poor for pt. And pt's son does not want to pursue aggressive care if she is not improving.    All the records are reviewed and case discussed with Care Management/Social Workerr. Management plans discussed with the patient, family and they are in agreement.  CODE STATUS: DNR as per discussion with son with Dr. Juleen China & Dr. Stevenson Clinch.   TOTAL TIME TAKING CARE OF THIS PATIENT: 30 minutes.   POSSIBLE D/C unclear, DEPENDING ON CLINICAL CONDITION.   Henreitta Leber M.D on 05/05/2016 at 11:25 AM  Between 7am to 6pm - Pager - [phone]  After 6pm: House Pager: - [phone]  [facility]  Office  [phone]  CC: Primary care physician; Marinda Elk, MD

## 2016-05-05 NOTE — Consult Note (Addendum)
PULMONARY / CRITICAL CARE MEDICINE   Name: Jacqueline Boyd MRN: [id number] DOB: 05-11-1941    ADMISSION DATE:  05/20/2016 CONSULTATION DATE:  05/07/2016  REFERRING MD: Dr. Lavetta Nielsen  CHIEF COMPLAINT:  Acute Respiratory Distress  HISTORY OF PRESENT ILLNESS:   Jacqueline Boyd is a 75 yo female with a known medical history of CHF, COPD with emphysema and hypertension who initially presented on 05/22/2016 with dark tarry stools, lower extremity edema, encephalopathy and hypotension. She was admitted to the hospitalist service and was being treated for a GI bleed, acute on chronic diastolic heart failure and acute renal failure. While on the floor, patient became severely hypoxic, and unresponsive with SPO2 in the low 80s. Hence, she was transferred to the ICU and emergently intubation for acute hypoxemic respiratory failure. At the time it was believed that her respiratory failure was due to pulmonary edema from volume overload and COPD exacerbation. She also developed renal failure with an increase in her creatinine from 0.88 at baseline 2.78. She was extubated on 05/03/2016. Postextubation her oxygen saturation has remained stable. However, her WBC is trending up, her abdomen is more distended and painful, she is anuric and hypotensive. PCM was re-consulted because her heart rate remains in the 140s and her mean arterial blood pressures in the 50s. She remains confused with very minimal oral intake. She was seen by nephrology and started on a Lasix infusion for anuria and acute renal failure. Her urine output has not improved with the Lasix infusion.  PAST MEDICAL HISTORY :  She  has a past medical history of CHF (congestive heart failure) ([place]); COPD with emphysema ([place]); and Hypertension.  PAST SURGICAL HISTORY: She  has a past surgical history that includes Hip surgery (Right) and Tumor removal.  Allergies  Allergen Reactions  . Codeine Other (See Comments)    Itching and "feels like worms crawling in  body".  . Penicillins Rash    Has patient had a PCN reaction causing immediate rash, facial/tongue/throat swelling, SOB or lightheadedness with hypotension: No  Has patient had a PCN reaction causing severe rash involving mucus membranes or skin necrosis: No Has patient had a PCN reaction that required hospitalization: No   Has patient had a PCN reaction occurring within the last 10 years: No  If all of the above answers are "NO", then may proceed with Cephalosporin use.   . Prednisone Palpitations    No current facility-administered medications on file prior to encounter.    Current Outpatient Prescriptions on File Prior to Encounter  Medication Sig  . acetaminophen (TYLENOL) 500 MG tablet Take 1,000 mg by mouth every 8 (eight) hours as needed.  Marland Kitchen albuterol (PROVENTIL HFA;VENTOLIN HFA) 108 (90 Base) MCG/ACT inhaler Inhale 1-2 puffs into the lungs every 6 (six) hours as needed for wheezing or shortness of breath.  . allopurinol (ZYLOPRIM) 100 MG tablet Take 1 tablet (100 mg total) by mouth daily.  Marland Kitchen ALPRAZolam (XANAX) 0.25 MG tablet Take 1 tablet (0.25 mg total) by mouth 3 (three) times daily as needed for anxiety.  . colchicine 0.6 MG tablet Take 1 tablet (0.6 mg total) by mouth 2 (two) times daily.  . cyclobenzaprine (FLEXERIL) 5 MG tablet Take 1 tablet (5 mg total) by mouth 3 (three) times daily as needed for muscle spasms.  . furosemide (LASIX) 20 MG tablet Take 1 tablet (20 mg total) by mouth daily as needed for fluid. (Patient taking differently: Take 80 mg by mouth daily as needed for fluid. )  . magnesium  hydroxide (MILK OF MAGNESIA) 400 MG/5ML suspension Take 30 mLs by mouth every 4 (four) hours as needed for mild constipation.  . mirtazapine (REMERON) 15 MG tablet Take 1 tablet (15 mg total) by mouth at bedtime.  . Multiple Vitamin (MULTIVITAMIN WITH MINERALS) TABS tablet Take 1 tablet by mouth daily.  Marland Kitchen oxyCODONE (OXY IR/ROXICODONE) 5 MG immediate release tablet Take 1 tablet (5  mg total) by mouth every 6 (six) hours as needed for moderate pain or breakthrough pain.  Marland Kitchen doxycycline (VIBRA-TABS) 100 MG tablet Take 1 tablet (100 mg total) by mouth every 12 (twelve) hours. (Patient not taking: Reported on 05/16/2016)  . feeding supplement, ENSURE ENLIVE, (ENSURE ENLIVE) LIQD Take 237 mLs by mouth 3 (three) times daily between meals. (Patient not taking: Reported on 05/13/2016)  . polyethylene glycol (MIRALAX / GLYCOLAX) packet Take 17 g by mouth daily. (Patient not taking: Reported on 05/14/2016)    FAMILY HISTORY:  Her indicated that her mother is deceased. She indicated that her father is deceased. She indicated that the status of her other is unknown.    SOCIAL HISTORY: She  reports that she quit smoking about 25 years ago. She has never used smokeless tobacco. She reports that she drinks alcohol. She reports that she does not use drugs.  REVIEW OF SYSTEMS:   Limited due to patient's mental status. She is able to answer yes or no, patient's but unable to elaborate on her symptoms. She agrees to abdominal pain, but otherwise unable to characterize any other symptoms  SUBJECTIVE:   VITAL SIGNS: BP (!) 103/50   Pulse (!) 125   Temp 98.6 F (37 C) (Oral)   Resp (!) 21   Ht 5\' 5"  (1.651 m)   Wt 56 kg (123 lb 7.3 oz)   SpO2 99%   BMI 20.54 kg/m   HEMODYNAMICS:    VENTILATOR SETTINGS: FiO2 (%):  [36 %] 36 %  INTAKE / OUTPUT: I/O last 3 completed shifts: In: 2704.3 [P.O.:480; I.V.:464.3; Other:760; IV Piggyback:1000] Out: 55 [Urine:190; Stool:475]  PHYSICAL EXAMINATION: General:  Frail elderly white female, appears to be in moderate distress Neuro:  Awake, confused, follows some basic commands HEENT: Sunken eyes, Atraumatic, Normocephalic, upward gaze, PERRLA Cardiovascular: AP irregular-irregular at 144 bpm, S1/S2, no regurg or gallop Lungs:  Unlabored, breath sounds diminished bilaterally, no wheezes, crackles, rhonchi noted Abdomen:  Distended,  hypoactive bowel sounds, positive fluid wave, diffuse, severe tenderness on percussion and palpation. Unable to assess liver span due to pain Musculoskeletal: Mild contractures in upper extremities, no other deformities noted. Extremities: +2 nonpitting edema in bilateral lower extremities, +1 pulses in bilateral lower extremities and +2 pulses in bilateral upper extremities Skin:  Dressing CDI on left lower extremity, severe venostasis discoloration in bilateral lower extremities, multiple bruises and poor skin turgor  LABS:  BMET  Recent Labs Lab 05/02/16 0432 05/03/16 0351 05/04/16 0501  NA 137 138 138  K 5.1 4.4 4.7  CL 101 103 104  CO2 26 28 26   BUN 73* 76* 80*  CREATININE 2.85* 2.36* 2.37*  GLUCOSE 87 123* 120*    Electrolytes  Recent Labs Lab 05/02/16 0432  05/02/16 1654 05/03/16 0351 05/04/16 0501  CALCIUM 7.4*  --   --  7.5* 7.5*  MG  --   < > 2.3 2.3 2.2  PHOS  --   < > 4.7* 4.7* 4.9*  < > = values in this interval not displayed.  CBC  Recent Labs Lab 05/03/16 0351 05/04/16 0501 05/05/16  0254  WBC 8.4 19.2* 30.1*  HGB 10.5* 11.3* 12.3  HCT 33.0* 37.1 41.3  PLT 262 217 161    Coag's  Recent Labs Lab 05/08/2016 0751 05/05/16 0254  APTT 36  --   INR 1.16 1.00    Sepsis Markers  Recent Labs Lab 05/11/2016 0828 05/03/16 0351 05/05/16 0254  LATICACIDVEN  --   --  1.5  PROCALCITON 1.71 14.50 17.73    ABG  Recent Labs Lab 05/08/2016 2120  PHART 7.30*  PCO2ART 51*  PO2ART 104    Liver Enzymes  Recent Labs Lab 05/21/2016 0751  AST 34  ALT 29  ALKPHOS 170*  BILITOT 0.5  ALBUMIN 3.0*    Cardiac Enzymes  Recent Labs Lab 05/18/2016 0751  TROPONINI 0.20*    Glucose  Recent Labs Lab 05/02/16 1707 05/02/16 1954 05/02/16 2338 05/03/16 0322 05/03/16 0835 05/03/16 1152  GLUCAP 78 111* 106* 116* 142* 116*    Imaging Dg Abd 1 View  Result Date: 05/05/2016 CLINICAL DATA:  Abdominal distention. EXAM: ABDOMEN - 1 VIEW  COMPARISON:  Radiograph 05/24/2016 FINDINGS: Mild gaseous distention of small bowel loops in the central abdomen measure up to 3.3 cm. Central positioning of small bowel may be secondary to ascites. No definite formed stool is seen. No evidence of free air. Irregular calcifications in the right mid abdomen, of uncertain etiology. Chronic change about the left hip, post right hip replacement. IMPRESSION: Mild gaseous distention of central small bowel loops, pattern suggests ileus. Irregular calcifications in the right mid abdomen, of uncertain significance. Electronically Signed   By: Jeb Levering M.D.   On: 05/05/2016 03:34   US Abdomen Complete  Result Date: 05/04/2016 CLINICAL DATA:  Abdominal distention and acute renal failure. EXAM: ABDOMEN ULTRASOUND COMPLETE COMPARISON:  None. FINDINGS: Gallbladder: There is a small amount of sludge in the gallbladder. The gallbladder wall is thickened is 6 mm. No stones or Murphy's sign. Common bile duct: Diameter: 4.2 mm. Liver: Nodular contour to the liver. Possible intrahepatic ductal dilatation. IVC: No abnormality visualized. Pancreas: Visualized portion unremarkable. Spleen: Size and appearance within normal limits. Right Kidney: Length: 7.4 cm.  Atrophic with increased echogenicity. Left Kidney: Length: 11.1 cm. Echogenicity within normal limits. No mass or hydronephrosis visualized. Abdominal aorta: No aneurysm visualized. Other findings: Moderate ascites and bilateral pleural effusions. IMPRESSION: 1. Nodular liver and ascites suggesting cirrhosis.  No splenomegaly. 2. Suggested intrahepatic ductal dilatation. 3. There is small amount of sludge in the gallbladder. There is marked wall thickening but no stones or Murphy's sign. A HIDA scan could further evaluate if there is concern. 4. Atrophic right kidney. 5. Moderate ascites. 6. The sonographer described dilated bowel. This was not well seen on the KUB from 3 days ago. The images provided today are not  convincing. Recommend a KUB to evaluate for bowel distention as clinically warranted. Electronically Signed   By: Dorise Bullion III M.D   On: 05/04/2016 18:07   Dg Chest Port 1 View  Result Date: 05/05/2016 CLINICAL DATA:  Acute respiratory failure. EXAM: PORTABLE CHEST 1 VIEW COMPARISON:  Radiographs yesterday at 0543 hour FINDINGS: Moderate to large bilateral pleural effusions, increasing on the right. Associated bibasilar opacities. Unchanged cardiomediastinal contours with cardiomegaly and atherosclerosis. Vascular congestion and mild pulmonary edema, stable. No pneumothorax. IMPRESSION: Moderate to large pleural effusions, with mild increase on the right. Vascular congestion and probable perihilar edema. Electronically Signed   By: Jeb Levering M.D.   On: 05/05/2016 03:36   Dg Chest [facility]  1 View  Result Date: 05/04/2016 CLINICAL DATA:  Chest x-ray.  Respiratory failure. EXAM: PORTABLE CHEST 1 VIEW COMPARISON:  Chest radiograph 05/03/2016. FINDINGS: Monitoring leads overlie the patient. Patient is rotated. Stable enlarged cardiac and mediastinal contours. Layering bilateral pleural effusions, right-greater-than-left with underlying consolidative opacities. No pneumothorax. IMPRESSION: Cardiomegaly. Grossly unchanged layering bilateral pleural effusions with underlying opacities. Electronically Signed   By: Lovey Newcomer M.D.   On: 05/04/2016 07:19   STUDIES:  12/22/15 CT angio Chest>>No acute abnormality. No evidence of pulmonary emboli  123456 echo>> Diastolic dysfunction- 99991111  CULTURES: Stool culture positive for C. Difficile 05/03/16  ANTIBIOTICS: Vancomycin oral solution12/12/2015 Metronidazole 05/05/16>  SIGNIFICANT EVENTS: 12/6>> Patient transferred  to ICU unresponsive and hypoxemic, required emergent intubation  05/03/16>extubbated, developed Afib with RVR and started on amiodarone infusion 05/04/16: PCCM signed off 05/05/16: PCCM re-consulted for Afib with RVR,  abdominal distending and worsening leukocytosis and   LINES/TUBES: PIVs  DISCUSSION: This is a 75 year old Caucasian female with a past medical history of hypertension, COPD and CHF presenting with acute renal failure, C. difficile colitis, worsening abdominal distention and pain, worsening bilateral pleural effusions, ascites, and adult failure to thrive  ASSESSMENT / PLAN:  PULMONARY A: Acute On chronic Respiratory Failure-resolved; now extubated and on nasal cannula at 2-4 L supplemental oxygen when necessary COPD exacerbation Bilateral Pleural effusion-Pleural effusions on improved with diuresis P:   PRN Bronchodilators on when necessary supplemental oxygen Daily chest x-ray to evaluate pleural effusions. Patient may need a thoracentesis  CARDIOVASCULAR A:  Diastolic Dysfunction grade -2-last echo showed an EF of 30-35%; even though patient has some lower extremity edema; She is clearly in volume depletion from poor oral intake, diuresis, sepsis, and protein calorie malnutrition ?Cardiogenic shock-refractory to IV fluids Elevated troponin possibly due to demand ischemia Atrial fibrillation with rapid ventricular rate-heart rate in the 140s P:  Continuous Telemetry Currently off pressors. IV fluid boluses Keep MAP goals>60 Cardiology following Resume amiodarone infusion and discontinued oral dose; 150 mg bolus given 1 Stat EKG reviewed with no ST changes  RENAL A:   Acute Renal failure-worsening creatinine with diuresis P:   Nephrology following Replace electrolytes per ICU protocol Avoid nephrotoxic drugs Will hold off on diuresis for now in light of severe hypotension  GASTROINTESTINAL A:   Upper GI bleed  Acute abdomen-distention with hypoactive bowel sounds and severe pain with gentle palpation. C. difficile colitis Protein calorie malnutrition P: Oral intake of fluids as tolerated  Continue Protonix We will add Flagyl to current vancomycin regimen for C.  difficile colitis Abdominal x-ray ordered Consider CT abdomen with oral contrast for further evaluation of pain and distention Ensure Plus dietary supplements 3 times a day  HEMATOLOGIC A:   No active issues P:  H/H stable Transfuse if Hgb<7  INFECTIOUS A:   C. difficile colitis. Upper tract infection versus community acquired pneumonia-resolved P:   procalcitonin 17.73 up from 14.5 Azithromycin X5 days-completed Oral vancomycin and IV Flagyl Obtain blood and urine cultures Monitor fever curve  ENDOCRINE A:   No active issues P:   Monitor BS intermittently with BMP  NEUROLOGIC A:  In management  Anxiety Acute metabolic encephalopathy P:   RASS goal: N/A Continue Alprazolam PRN Frequent reorientation Lights on during day When necessary fentanyl for comfort  FAMILY  - Updates: Son was updated regarding the patient's condition, specifically her declining renal function, worsening infection and new onset abdominal distention and pain.  Son has decided to make the patient DNR at this  time   [place] [facility] ANP-BC Pulmonary and [place] Pager [phone] or [phone] 05/05/2016, 3:42 AM   STAFF NOTE: I, Dr. Vilinda Boehringer have personally reviewed patient's available data, including medical history, events of note, physical examination and test results as part of my evaluation. I have discussed with NP Demaris Callander  and other care providers such as pharmacist, RN and RRT.   S: reconsulted for worsening cdiff colitis and afib w/rvr. Now DNR, restarted on amio gtt, and added IV flagyl  A:75 year old Caucasian female with a past medical history of hypertension, COPD and CHF presenting with acute renal failure, C. difficile colitis, worsening abdominal distention and pain, worsening bilateral pleural effusions, ascites, and adult failure to thrive, now complicated by Afib w/RVR, worsening C. Diff colitis, and possible  ileus  Acute On chronic Respiratory Failure-resolved; now extubated and on nasal cannula at 2-4 L supplemental oxygen when necessary COPD exacerbation Bilateral Pleural effusion-Pleural effusions on improved with diuresis Diastolic Dysfunction grade -2-last echo showed an EF of 30-35%; even though patient has some lower extremity edema; She is clearly in volume depletion from poor oral intake, diuresis, sepsis, and protein calorie malnutrition ?Cardiogenic shock-refractory to IV fluids Elevated troponin possibly due to demand ischemia Atrial fibrillation with rapid ventricular rate-heart rate in the 140s Acute Renal failure-worsening creatinine with diuresis Upper GI bleed  Acute abdomen-distention with hypoactive bowel sounds and severe pain with gentle palpation. Possible ileus C. difficile colitis Protein calorie malnutrition C. difficile colitis. Upper tract infection versus community acquired pneumonia-resolved In management  Anxiety Acute metabolic encephalopathy  P:  PRN Bronchodilators on when necessary supplemental oxygen Daily chest x-ray to evaluate pleural effusions. Currently with stable respiratory status, if respiratory status worsens may need to consider a thoracentesis Continuous Telemetry Currently off pressors. IV fluid boluses Keep MAP goals>60 Refractory Afib in the setting of colitis\sepsis Cardiology following Resume amiodarone infusion and discontinued oral dose; 150 mg bolus given 1 Stat EKG reviewed with no ST changes Nephrology following Replace electrolytes per ICU protocol Avoid nephrotoxic drugs Will hold off on diuresis for now in light of severe hypotensionOral intake of fluids as tolerated  Continue Protonix D\W with pharmacy - added PO Flagyl and increase Vanc dose, for severe C.colitis Abdominal x-ray reviewed - possible early ileus, has rectal tube in place (no acute bleeding noted) Consider CT abdomen with oral contrast for further  evaluation of pain and distention, if no significant improvement Ensure Plus dietary supplements 3 times a day  .  Rest per NP/medical resident whose note is outlined above and that I agree with  The patient is critically ill with multiple organ systems failure and requires high complexity decision making for assessment and support, frequent evaluation and titration of therapies, application of advanced monitoring technologies and extensive interpretation of multiple databases.   Critical Care Time devoted to patient care services described in this note is  40 Minutes.   This time reflects time of care of this signee Dr Vilinda Boehringer.  This critical care time does not reflect procedure time, or teaching time or supervisory time of PA/NP/Med-student/Med Resident etc but could involve care discussion time.  Vilinda Boehringer, MD [facility] Pager [phone] (please enter 7-digits) On Call Pager [phone] (please enter 7-digits)  Note: This note was prepared with Dragon dictation along with smaller phrase technology. Any transcriptional errors that result from this process are unintentional.

## 2016-05-05 NOTE — Progress Notes (Signed)
[facility]  ROUNDING NOTE   Subjective:   Taken off fursoemide gtt due to hypotension. Anuric.  K 5.4 Creatinine 2.71 (2.37)  Objective:  Vital signs in last 24 hours:  Temp:  [97.9 F (36.6 C)-98.6 F (37 C)] 97.9 F (36.6 C) (12/10 0800) Pulse Rate:  [65-138] 120 (12/10 0800) Resp:  [11-24] 12 (12/10 0800) BP: (85-113)/(45-71) 105/45 (12/10 0800) SpO2:  [80 %-100 %] 99 % (12/10 0800) FiO2 (%):  [36 %] 36 % (12/09 1620) Weight:  [57.1 kg (125 lb 14.1 oz)] 57.1 kg (125 lb 14.1 oz) (12/10 0427)  Weight change: 1.1 kg (2 lb 6.8 oz) Filed Weights   05/03/16 0600 05/04/16 0500 05/05/16 0427  Weight: 59.6 kg (131 lb 6.3 oz) 56 kg (123 lb 7.3 oz) 57.1 kg (125 lb 14.1 oz)    Intake/Output: I/O last 3 completed shifts: In: 4041.1 [P.O.:480; I.V.:351.1; Other:110; IV Piggyback:3100] Out: 850 [Urine:325; Stool:525]   Intake/Output this shift:  Total I/O In: -  Out: 10 [Urine:10]  Physical Exam: General: Ill appearing, cachectic  Head: Moist oral mucosal membranes  Eyes: Anicteric, PERRL  Neck: Supple, trachea midline  Lungs:  Bilateral crackles   Heart: Regular rate and rhythm  Abdomen:  + distended  Extremities: + peripheral edema.  Neurologic: Nonfocal, moving all four extremities  Skin: No lesions       Basic Metabolic Panel:  Recent Labs Lab 05/05/2016 0751 05/02/16 0432 05/02/16 1358 05/02/16 1654 05/03/16 0351 05/04/16 0501 05/05/16 0254  NA 137 137  --   --  138 138 136  K 5.0 5.1  --   --  4.4 4.7 5.4*  CL 98* 101  --   --  103 104 101  CO2 27 26  --   --  28 26 24   GLUCOSE 88 87  --   --  123* 120* 122*  BUN 67* 73*  --   --  76* 80* 89*  CREATININE 2.78* 2.85*  --   --  2.36* 2.37* 2.71*  CALCIUM 8.0* 7.4*  --   --  7.5* 7.5* 8.1*  MG  --   --  2.4 2.3 2.3 2.2 2.5*  PHOS  --   --  4.9* 4.7* 4.7* 4.9* 5.8*    Liver Function Tests:  Recent Labs Lab 05/22/2016 0751 05/05/16 0254  AST 34 27  ALT 29 43  ALKPHOS 170* 140*   BILITOT 0.5 0.6  PROT 6.3* 5.2*  ALBUMIN 3.0* 2.1*   No results for input(s): LIPASE, AMYLASE in the last 168 hours. No results for input(s): AMMONIA in the last 168 hours.  CBC:  Recent Labs Lab 04/30/2016 0751 05/02/16 0432 05/03/16 0351 05/04/16 0501 05/05/16 0254  WBC 12.1* 11.2* 8.4 19.2* 30.1*  NEUTROABS 11.1*  --   --   --   --   HGB 10.6* 10.2* 10.5* 11.3* 12.3  HCT 33.8* 32.7* 33.0* 37.1 41.3  MCV 83.6 81.9 82.0 83.6 83.8  PLT 247 239 262 217 161    Cardiac Enzymes:  Recent Labs Lab 05/07/2016 0751 05/05/16 0254  TROPONINI 0.20* 0.34*    BNP: Invalid input(s): POCBNP  CBG:  Recent Labs Lab 05/02/16 1954 05/02/16 2338 05/03/16 0322 05/03/16 0835 05/03/16 1152  GLUCAP 111* 106* 116* 142* 116*    Microbiology: Results for orders placed or performed during the hospital encounter of 05/08/2016  MRSA PCR Screening     Status: None   Collection Time: 05/18/2016 11:52 PM  Result Value Ref Range  Status   MRSA by PCR NEGATIVE NEGATIVE Final    Comment:        The GeneXpert MRSA Assay (FDA approved for NASAL specimens only), is one component of a comprehensive MRSA colonization surveillance program. It is not intended to diagnose MRSA infection nor to guide or monitor treatment for MRSA infections.   C difficile quick screen w PCR reflex     Status: Abnormal   Collection Time: 05/02/16 10:02 AM  Result Value Ref Range Status   C Diff antigen POSITIVE (A) NEGATIVE Final   C Diff toxin POSITIVE (A) NEGATIVE Final   C Diff interpretation Toxin producing C. difficile detected.  Final    Comment: CRITICAL RESULT CALLED TO, READ BACK BY AND VERIFIED WITH: RENEE BABB 05/02/16 @ 2052  [place]   Culture, blood (Routine X 2) w Reflex to ID Panel     Status: None (Preliminary result)   Collection Time: 05/05/16  4:36 AM  Result Value Ref Range Status   Specimen Description BLOOD LEFT ASSIST CONTROL  Final   Special Requests BOTTLES DRAWN AEROBIC AND ANAEROBIC  [id number]  Final   Culture NO GROWTH < 12 HOURS  Final   Report Status PENDING  Incomplete    Coagulation Studies:  Recent Labs  05/05/16 0254  LABPROT 13.2  INR 1.00    Urinalysis:  Recent Labs  05/04/16 1019  COLORURINE AMBER*  LABSPEC 1.019  PHURINE 5.0  GLUCOSEU NEGATIVE  HGBUR NEGATIVE  BILIRUBINUR NEGATIVE  KETONESUR NEGATIVE  PROTEINUR 30*  NITRITE NEGATIVE  LEUKOCYTESUR SMALL*      Imaging: Dg Abd 1 View  Result Date: 05/05/2016 CLINICAL DATA:  Abdominal distention. EXAM: ABDOMEN - 1 VIEW COMPARISON:  Radiograph 04/28/2016 FINDINGS: Mild gaseous distention of small bowel loops in the central abdomen measure up to 3.3 cm. Central positioning of small bowel may be secondary to ascites. No definite formed stool is seen. No evidence of free air. Irregular calcifications in the right mid abdomen, of uncertain etiology. Chronic change about the left hip, post right hip replacement. IMPRESSION: Mild gaseous distention of central small bowel loops, pattern suggests ileus. Irregular calcifications in the right mid abdomen, of uncertain significance. Electronically Signed   By: Jeb Levering M.D.   On: 05/05/2016 03:34   US Abdomen Complete  Result Date: 05/04/2016 CLINICAL DATA:  Abdominal distention and acute renal failure. EXAM: ABDOMEN ULTRASOUND COMPLETE COMPARISON:  None. FINDINGS: Gallbladder: There is a small amount of sludge in the gallbladder. The gallbladder wall is thickened is 6 mm. No stones or Murphy's sign. Common bile duct: Diameter: 4.2 mm. Liver: Nodular contour to the liver. Possible intrahepatic ductal dilatation. IVC: No abnormality visualized. Pancreas: Visualized portion unremarkable. Spleen: Size and appearance within normal limits. Right Kidney: Length: 7.4 cm.  Atrophic with increased echogenicity. Left Kidney: Length: 11.1 cm. Echogenicity within normal limits. No mass or hydronephrosis visualized. Abdominal aorta: No aneurysm visualized.  Other findings: Moderate ascites and bilateral pleural effusions. IMPRESSION: 1. Nodular liver and ascites suggesting cirrhosis.  No splenomegaly. 2. Suggested intrahepatic ductal dilatation. 3. There is small amount of sludge in the gallbladder. There is marked wall thickening but no stones or Murphy's sign. A HIDA scan could further evaluate if there is concern. 4. Atrophic right kidney. 5. Moderate ascites. 6. The sonographer described dilated bowel. This was not well seen on the KUB from 3 days ago. The images provided today are not convincing. Recommend a KUB to evaluate for bowel distention as clinically warranted. Electronically Signed  By: Dorise Bullion III M.D   On: 05/04/2016 18:07   Dg Chest Port 1 View  Result Date: 05/05/2016 CLINICAL DATA:  Acute respiratory failure. EXAM: PORTABLE CHEST 1 VIEW COMPARISON:  Radiographs yesterday at 0543 hour FINDINGS: Moderate to large bilateral pleural effusions, increasing on the right. Associated bibasilar opacities. Unchanged cardiomediastinal contours with cardiomegaly and atherosclerosis. Vascular congestion and mild pulmonary edema, stable. No pneumothorax. IMPRESSION: Moderate to large pleural effusions, with mild increase on the right. Vascular congestion and probable perihilar edema. Electronically Signed   By: Jeb Levering M.D.   On: 05/05/2016 03:36   Dg Chest Port 1 View  Result Date: 05/04/2016 CLINICAL DATA:  Chest x-ray.  Respiratory failure. EXAM: PORTABLE CHEST 1 VIEW COMPARISON:  Chest radiograph 05/03/2016. FINDINGS: Monitoring leads overlie the patient. Patient is rotated. Stable enlarged cardiac and mediastinal contours. Layering bilateral pleural effusions, right-greater-than-left with underlying consolidative opacities. No pneumothorax. IMPRESSION: Cardiomegaly. Grossly unchanged layering bilateral pleural effusions with underlying opacities. Electronically Signed   By: Lovey Newcomer M.D.   On: 05/04/2016 07:19     Medications:    . amiodarone 60 mg/hr (05/05/16 0800)   . metroNIDAZOLE  500 mg Oral Q8H  . pantoprazole (PROTONIX) IV  40 mg Intravenous Q12H  . sodium chloride flush  3 mL Intravenous Q12H  . vancomycin  500 mg Oral Q6H   acetaminophen **OR** acetaminophen, ALPRAZolam, diphenhydrAMINE, fentaNYL (SUBLIMAZE) injection, ipratropium-albuterol, ondansetron **OR** ondansetron (ZOFRAN) IV  Assessment/ Plan:  [name] is a 75 y.o. white female with COPD, CHF, hypertension, gout, who was admitted to [facility] on 05/05/2016   1. Acute Renal Failure with hyperkalemia: secondary to ATN, hypotension, hypoxia Nonoliguric urine output.   Not responding to diuretics Discussed case with son, Jacqueline Boyd. He states patient would not want dialysis.   2. Acute exacerbation of systolic congestive heart failure with respiratory failure on mechanical ventilation. Concurrent acute exacerbation of COPD Extubated 12/8. Off Bipap   3. Acute GI Bleed with anemia: hemoglobin remains stable.   4. C Diff colitis: leukocytosis.  - PO vanco - metronidazole  Overall prognosis is poor.    LOS: 4 Jacqueline Boyd 12/10/20179:39 AM

## 2016-05-06 DIAGNOSIS — A419 Sepsis, unspecified organism: Secondary | ICD-10-CM

## 2016-05-06 DIAGNOSIS — R0600 Dyspnea, unspecified: Secondary | ICD-10-CM

## 2016-05-06 DIAGNOSIS — R6520 Severe sepsis without septic shock: Secondary | ICD-10-CM

## 2016-05-06 LAB — BLOOD GAS, ARTERIAL
Acid-base deficit: 8.5 mmol/L — ABNORMAL HIGH (ref 0.0–2.0)
Bicarbonate: 22.5 mmol/L (ref 20.0–28.0)
FIO2: 0.4
O2 Saturation: 95.9 %
PATIENT TEMPERATURE: 37
PO2 ART: 108 mmHg (ref 83.0–108.0)
pCO2 arterial: 74 mmHg (ref 32.0–48.0)
pH, Arterial: 7.09 — CL (ref 7.350–7.450)

## 2016-05-06 MED ORDER — ORAL CARE MOUTH RINSE: 15.0000 mL | Freq: Two times a day (BID) | OROMUCOSAL | Status: DC

## 2016-05-06 MED ORDER — MORPHINE 100MG IN NS 100ML (1MG/ML) PREMIX INFUSION
10.0000 mg/h | INTRAVENOUS | Status: DC
  Administered 2016-05-06 (×2): 10 mg/h via INTRAVENOUS
  Filled 2016-05-06 (×2): qty 100

## 2016-05-06 MED ORDER — CHLORHEXIDINE GLUCONATE 0.12 % MT SOLN
15.0000 mL | Freq: Two times a day (BID) | OROMUCOSAL | Status: DC
  Administered 2016-05-06: 15 mL via OROMUCOSAL

## 2016-05-06 MED ORDER — MORPHINE BOLUS VIA INFUSION
5.0000 mg | INTRAVENOUS | Status: DC | PRN
  Administered 2016-05-06: 20 mg via INTRAVENOUS
  Filled 2016-05-06: qty 20

## 2016-05-10 LAB — CULTURE, BLOOD (ROUTINE X 2)
Culture: NO GROWTH
Culture: NO GROWTH

## 2016-05-27 NOTE — Progress Notes (Signed)
NP Tukov notified of pts inability to take PO meds. Unable to give PO Flagyl and Vancomycin. No new orders received at this time. Pt has become increasingly lethargic and is minimally responsive. Family does not wish to place pt on bipap. Son Corene Cornea notified that his mom has declined overnight. He states he will be in this morning.

## 2016-05-27 NOTE — Progress Notes (Signed)
Patient passed at 1717, MD Manuella Ghazi made aware, son-Jason made aware.

## 2016-05-27 NOTE — Progress Notes (Signed)
Chaplain moned about the unit to provide a compassionate presence and support to the patient and family.  The patient appeared to be sleeping.  No one was at the bedside and Chaplain provided a presence and silent prayer.  Minerva Fester [phone]

## 2016-05-27 NOTE — Progress Notes (Signed)
At start of shift amiodarone running at 30mg /hr on pump. When documenting I/O's when new bag of amiodarone scanned in over night rate was documented as running at 60mg /hr. I/O's cannot be documented correctly. Amiodarone remains infusing at 30 mg/hr and corrected in [facility] for my shift per order. Will continue to monitor patient.

## 2016-05-27 NOTE — Progress Notes (Signed)
Discussed case with pt's caregiver (son).  I explained the patient has experienced a progressive downward course over the past few days, now with Renal failure, severe sepsis 2/2 c.diff colitis, respiratory distress, recent mechanical ventilation.     A further downward course is predictable and likely, will likely involve further hospital admissions, intubation, with further decline in functional status.  Pt's caregiver discussed the situation family and medical team. Given poor prognosis and no significant improvement with optimal/maximal care, they have decided on comfort care/withdrawal of care measures.    RN Witness - Lovena Le  - Vilinda Boehringer, M.D. Time spent in discussion 30 min.  05-25-16

## 2016-05-27 NOTE — Progress Notes (Signed)
Nutrition Brief Note  Chart reviewed and discussed on rounds. Patient now transitioning to comfort care.   No further nutrition interventions warranted at this time. Please consult Dietitian as needed.   Willey Blade, MS, RD, LDN Pager: [phone] After Hours Pager: [phone]

## 2016-05-27 NOTE — Consult Note (Signed)
PULMONARY / CRITICAL CARE MEDICINE   Name: Jacqueline Boyd MRN: [id number] DOB: 07/27/1940    ADMISSION DATE:  05/09/2016 CONSULTATION DATE:  05/18/2016  REFERRING MD: Dr. Lavetta Nielsen  CHIEF COMPLAINT:  Acute Respiratory Distress  HISTORY OF PRESENT ILLNESS:   Jacqueline Boyd is a 76 yo female with a known medical history of CHF, COPD with emphysema and hypertension who initially presented on 05/07/2016 with dark tarry stools, lower extremity edema, encephalopathy and hypotension. She was admitted to the hospitalist service and was being treated for a GI bleed, acute on chronic diastolic heart failure and acute renal failure. While on the floor, patient became severely hypoxic, and unresponsive with SPO2 in the low 80s. Hence, she was transferred to the ICU and emergently intubation for acute hypoxemic respiratory failure. At the time it was believed that her respiratory failure was due to pulmonary edema from volume overload and COPD exacerbation. She also developed renal failure with an increase in her creatinine from 0.88 at baseline 2.78. She was extubated on 05/03/2016. Postextubation her oxygen saturation has remained stable. However, her WBC is trending up, her abdomen is more distended and painful, she is anuric and hypotensive. PCCM was re-consulted because her heart rate remains in the 140s and her mean arterial blood pressures in the 50s. She remains confused with very minimal oral intake. She was seen by nephrology and started on a Lasix infusion for anuria and acute renal failure. Her urine output has not improved with the Lasix infusion.  SUBJECTIVE: Patient with increased lethargy today, no significant improvement with bipap overnight.  Family conference with son this morning,at this time family (son, HCPOA) has transitioned patient to comfort care/withdrawal of care.   PAST MEDICAL HISTORY :  She  has a past medical history of CHF (congestive heart failure) ([place]); COPD with emphysema ([place]); and  Hypertension.  PAST SURGICAL HISTORY: She  has a past surgical history that includes Hip surgery (Right) and Tumor removal.  Allergies  Allergen Reactions  . Codeine Other (See Comments)    Itching and "feels like worms crawling in body".  . Penicillins Rash    Has patient had a PCN reaction causing immediate rash, facial/tongue/throat swelling, SOB or lightheadedness with hypotension: No  Has patient had a PCN reaction causing severe rash involving mucus membranes or skin necrosis: No Has patient had a PCN reaction that required hospitalization: No   Has patient had a PCN reaction occurring within the last 10 years: No  If all of the above answers are "NO", then may proceed with Cephalosporin use.   . Prednisone Palpitations    No current facility-administered medications on file prior to encounter.    Current Outpatient Prescriptions on File Prior to Encounter  Medication Sig  . acetaminophen (TYLENOL) 500 MG tablet Take 1,000 mg by mouth every 8 (eight) hours as needed.  Marland Kitchen albuterol (PROVENTIL HFA;VENTOLIN HFA) 108 (90 Base) MCG/ACT inhaler Inhale 1-2 puffs into the lungs every 6 (six) hours as needed for wheezing or shortness of breath.  . allopurinol (ZYLOPRIM) 100 MG tablet Take 1 tablet (100 mg total) by mouth daily.  Marland Kitchen ALPRAZolam (XANAX) 0.25 MG tablet Take 1 tablet (0.25 mg total) by mouth 3 (three) times daily as needed for anxiety.  . colchicine 0.6 MG tablet Take 1 tablet (0.6 mg total) by mouth 2 (two) times daily.  . cyclobenzaprine (FLEXERIL) 5 MG tablet Take 1 tablet (5 mg total) by mouth 3 (three) times daily as needed for muscle spasms.  . furosemide (  LASIX) 20 MG tablet Take 1 tablet (20 mg total) by mouth daily as needed for fluid. (Patient taking differently: Take 80 mg by mouth daily as needed for fluid. )  . magnesium hydroxide (MILK OF MAGNESIA) 400 MG/5ML suspension Take 30 mLs by mouth every 4 (four) hours as needed for mild constipation.  . mirtazapine  (REMERON) 15 MG tablet Take 1 tablet (15 mg total) by mouth at bedtime.  . Multiple Vitamin (MULTIVITAMIN WITH MINERALS) TABS tablet Take 1 tablet by mouth daily.  Marland Kitchen oxyCODONE (OXY IR/ROXICODONE) 5 MG immediate release tablet Take 1 tablet (5 mg total) by mouth every 6 (six) hours as needed for moderate pain or breakthrough pain.  Marland Kitchen doxycycline (VIBRA-TABS) 100 MG tablet Take 1 tablet (100 mg total) by mouth every 12 (twelve) hours. (Patient not taking: Reported on 05/22/2016)  . feeding supplement, ENSURE ENLIVE, (ENSURE ENLIVE) LIQD Take 237 mLs by mouth 3 (three) times daily between meals. (Patient not taking: Reported on 04/26/2016)  . polyethylene glycol (MIRALAX / GLYCOLAX) packet Take 17 g by mouth daily. (Patient not taking: Reported on 05/16/2016)    FAMILY HISTORY:  Her indicated that her mother is deceased. She indicated that her father is deceased. She indicated that the status of her other is unknown.    SOCIAL HISTORY: She  reports that she quit smoking about 25 years ago. She has never used smokeless tobacco. She reports that she drinks alcohol. She reports that she does not use drugs.  REVIEW OF SYSTEMS:   Unable to obtain, critically ill  VITAL SIGNS: BP (!) 101/52 (BP Location: Right Arm)   Pulse (!) 116   Temp 97.5 F (36.4 C) (Axillary)   Resp (!) 25   Ht 5\' 5"  (1.651 m)   Wt 125 lb 14.1 oz (57.1 kg)   SpO2 94%   BMI 20.95 kg/m   HEMODYNAMICS:    VENTILATOR SETTINGS: FiO2 (%):  [30 %] 30 %  INTAKE / OUTPUT: I/O last 3 completed shifts: In: 2223 [I.V.:23; Other:100; IV [id number] Out: 425 [Urine:175; Stool:250]  PHYSICAL EXAMINATION: General:  Frail elderly white female, appears to be in moderate distress, lethargic Neuro:  Somnolent, lethargic, confused, no following commands.  HEENT: Sunken eyes, Atraumatic, Normocephalic, upward gaze, PERRLA Cardiovascular: AP irregular-irregular, S1/S2, no regurg or gallop Lungs:  Unlabored, breath sounds  diminished bilaterally, no wheezes, crackles, rhonchi noted Abdomen:  Distended, hypoactive bowel sounds, positive fluid wave, diffuse, severe tenderness on percussion and palpation. Unable to assess liver span due to pain Musculoskeletal: Mild contractures in upper extremities, no other deformities noted. Extremities: +2 nonpitting edema in bilateral lower extremities, +1 pulses in bilateral lower extremities and +2 pulses in bilateral upper extremities Skin:  Dressing CDI on left lower extremity, severe venostasis discoloration in bilateral lower extremities, multiple bruises and poor skin turgor  LABS:  BMET  Recent Labs Lab 05/03/16 0351 05/04/16 0501 05/05/16 0254  NA 138 138 136  K 4.4 4.7 5.4*  CL 103 104 101  CO2 28 26 24   BUN 76* 80* 89*  CREATININE 2.36* 2.37* 2.71*  GLUCOSE 123* 120* 122*    Electrolytes  Recent Labs Lab 05/03/16 0351 05/04/16 0501 05/05/16 0254  CALCIUM 7.5* 7.5* 8.1*  MG 2.3 2.2 2.5*  PHOS 4.7* 4.9* 5.8*    CBC  Recent Labs Lab 05/03/16 0351 05/04/16 0501 05/05/16 0254  WBC 8.4 19.2* 30.1*  HGB 10.5* 11.3* 12.3  HCT 33.0* 37.1 41.3  PLT 262 217 161    Coag's  Recent Labs Lab 05/11/2016 0751 05/05/16 0254  APTT 36  --   INR 1.16 1.00    Sepsis Markers  Recent Labs Lab 05/22/2016 0828 05/03/16 0351 05/05/16 0254  LATICACIDVEN  --   --  1.5  PROCALCITON 1.71 14.50 17.73    ABG  Recent Labs Lab 05/21/2016 2120 05/05/16 1108  PHART 7.30* 7.09*  PCO2ART 51* 74*  PO2ART 104 108    Liver Enzymes  Recent Labs Lab 04/27/2016 0751 05/05/16 0254  AST 34 27  ALT 29 43  ALKPHOS 170* 140*  BILITOT 0.5 0.6  ALBUMIN 3.0* 2.1*    Cardiac Enzymes  Recent Labs Lab 05/04/2016 0751 05/05/16 0254  TROPONINI 0.20* 0.34*    Glucose  Recent Labs Lab 05/02/16 1707 05/02/16 1954 05/02/16 2338 05/03/16 0322 05/03/16 0835 05/03/16 1152  GLUCAP 78 111* 106* 116* 142* 116*    Imaging No results found. STUDIES:   12/22/15 CT angio Chest>>No acute abnormality. No evidence of pulmonary emboli  [id number] echo>> Diastolic dysfunction- [id number]  CULTURES: Stool culture positive for C. Difficile 05/03/16  ANTIBIOTICS: Vancomycin oral solution12/12/2015 Metronidazole 05/05/16>  SIGNIFICANT EVENTS: 12/6>> Patient transferred  to ICU unresponsive and hypoxemic, required emergent intubation  05/03/16>extubbated, developed Afib with RVR and started on amiodarone infusion 05/04/16: PCCM signed off 05/05/16: PCCM re-consulted for Afib with RVR, abdominal distending and worsening leukocytosis and   LINES/TUBES: PIVs  DISCUSSION: This is a 76 year old Caucasian female with a past medical history of hypertension, COPD and CHF presenting with acute renal failure, C. difficile colitis, worsening abdominal distention and pain, worsening bilateral pleural effusions, ascites, and adult failure to thrive  ASSESSMENT / PLAN:  PULMONARY A: Acute On chronic Respiratory Failure-resolved; now extubated and on nasal cannula at 2-4 L supplemental oxygen when necessary COPD exacerbation Bilateral Pleural effusion-Pleural effusions on improved with diuresis P:   PRN Bronchodilators on when necessary supplemental oxygen Daily chest x-ray to evaluate pleural effusions. Patient may need a thoracentesis  CARDIOVASCULAR A:  Diastolic Dysfunction grade -2-last echo showed an EF of 30-35%; even though patient has some lower extremity edema; She is clearly in volume depletion from poor oral intake, diuresis, sepsis, and protein calorie malnutrition ?Cardiogenic shock-refractory to IV fluids Elevated troponin possibly due to demand ischemia Atrial fibrillation with rapid ventricular rate-heart rate in the 140s P:  Continuous Telemetry Currently off pressors. IV fluid boluses Keep MAP goals>60 Cardiology following Resume amiodarone infusion and discontinued oral dose; 150 mg bolus given 1 Stat EKG reviewed with no  ST changes  RENAL A:   Acute Renal failure-worsening creatinine with diuresis P:   Nephrology following Replace electrolytes per ICU protocol Avoid nephrotoxic drugs Will hold off on diuresis for now in light of severe hypotension  GASTROINTESTINAL A:   Upper GI bleed  Acute abdomen-distention with hypoactive bowel sounds and severe pain with gentle palpation. C. difficile colitis Protein calorie malnutrition P: Oral intake of fluids as tolerated  Continue Protonix We will add Flagyl to current vancomycin regimen for C. difficile colitis Abdominal x-ray ordered Consider CT abdomen with oral contrast for further evaluation of pain and distention Ensure Plus dietary supplements 3 times a day  HEMATOLOGIC A:   No active issues P:  H/H stable Transfuse if Hgb<7  INFECTIOUS A:   C. difficile colitis. Upper tract infection versus community acquired pneumonia-resolved P:   procalcitonin 17.73 up from 14.5 Azithromycin X5 days-completed Oral vancomycin and IV Flagyl Obtain blood and urine cultures Monitor fever curve  ENDOCRINE A:   No  active issues P:   Monitor BS intermittently with BMP  NEUROLOGIC A:  In management  Anxiety Acute metabolic encephalopathy P:   RASS goal: N/A Continue Alprazolam PRN Frequent reorientation Lights on during day When necessary fentanyl for comfort  FAMILY  - Updates: Son was updated regarding the patient's condition, specifically her declining renal function, worsening infection and new onset abdominal distention and pain.  Son has decided to make the patient comfort care at this time.  The patient is critically ill with multiple organ systems failure and requires high complexity decision making for assessment and support, frequent evaluation and titration of therapies, application of advanced monitoring technologies and extensive interpretation of multiple databases.   Critical Care Time devoted to patient care services  described in this note is  35 Minutes.   This time reflects time of care of this signee Dr Vilinda Boehringer.  This critical care time does not reflect procedure time, or teaching time or supervisory time of PA/NP/Med-student/Med Resident etc but could involve care discussion time.  Vilinda Boehringer, MD [facility] Pager [phone] (please enter 7-digits) On Call Pager [phone] (please enter 7-digits)  Note: This note was prepared with Dragon dictation along with smaller phrase technology. Any transcriptional errors that result from this process are unintentional.

## 2016-05-27 NOTE — Discharge Summary (Addendum)
   [employer] [facility] [place] at [facility]    Death Note     Death Note please see Last Note for all details.   In breif -  Jacqueline Boyd is a 76 y.o. female presenting with Melena . Admitted 04/30/2016  1. Sepsis - due to C. Diff colitis.    2. C. Difficile diarrhea: cont. Flexaseal.  Cont. Vanc, Flagyl and abdo. Is quite distended and X-ray  Showing an ileus.  - prognosis is poor.   3. Acute Resp. Failure w/ Hypercarbia - pt. Lethargic/encephalopathic - likely due to underlying sepsis, ARF.   4. Upper GI bleed: Hemoglobin stable.  No acute bleeding presently.  - appreciate GI input. Cont. Protonix.  No plans for intervention.   5. ARF - secondary to ATN, hypotension. Appreciate nephrology input -Nonoliguric urine output. Did not respond to diuretics.  Hyperkalemic.  - Nephrology discussed w/ son and no plans for HD and cont. Supportive care for now.   6. Leukocytosis - due to C. Diff diarrhea.  - WBC count trending up and due to sepsis   7. A. Fib w/ RVR - restarted back on Amio gtt - defer anticoagulation due to GI bleed and critical illness for now.    [name] [id number] is a 76 y.o. female, Outpatient Primary MD for the patient is [facility], Wilhemena Durie, MD  Pronounced dead by RN on  May 24, 2016   @   1717                Cause of death  1. Sepsis 2. C. Diff Colitis 3. Acute resp failure 4. Upper GI bleed  Max Sane M.D on 05/08/2016 at 11:29 PM  [name] [place] [phone]  Total clinical and documentation time for today Greater than 30 minutes   Last Note

## 2016-05-27 NOTE — Progress Notes (Signed)
Patient deceased. [facility] notified and patient not a candidate for organ donation. Patient with one yellow colored ring, sent to security, and one ring staff unable to remove from right hand. No other belongings.

## 2016-05-27 DEATH — deceased

## 2017-08-04 IMAGING — CT CT ANGIO CHEST
2 of 6 series · 18 of 46 positions shown · IV contrast (isovue)
Comparison: 12/22/2015 and prior radiographs.  08/01/2012 chest CT

CLINICAL DATA: 74-year-old female with shortness of breath for 3
days. Recent hip replacement.

EXAM:
CT ANGIOGRAPHY CHEST WITH CONTRAST
TECHNIQUE: Multidetector CT imaging of the chest was performed using the
standard protocol during bolus administration of intravenous
contrast. Multiplanar CT image reconstructions and MIPs were
obtained to evaluate the vascular anatomy.
CONTRAST:  75 cc intravenous Isovue 370

[Series 5: thins · axial · 0.60mm/px · z∈[-683,-368]mm · 15 of 345 slices shown]
[im 15/345  lung]
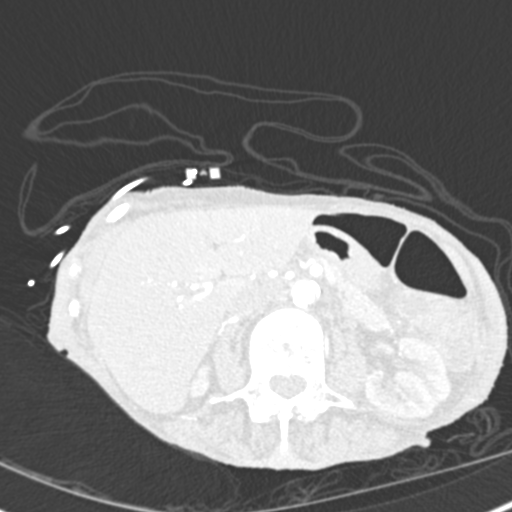
[im 45/345  soft-tissue]
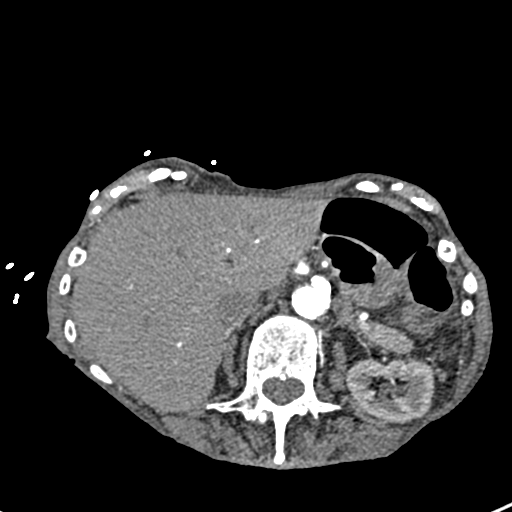
[im 60/345  lung]
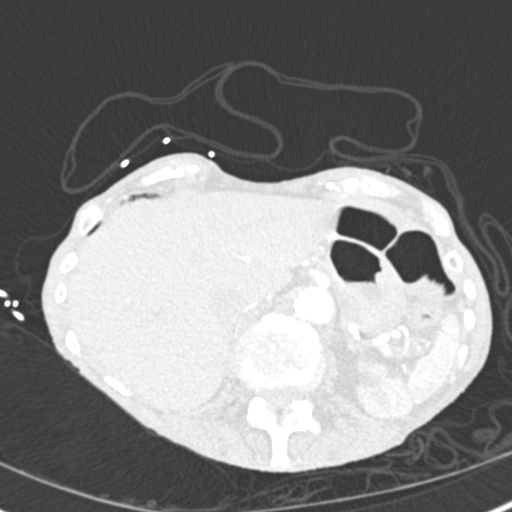
[im 90/345  soft-tissue]
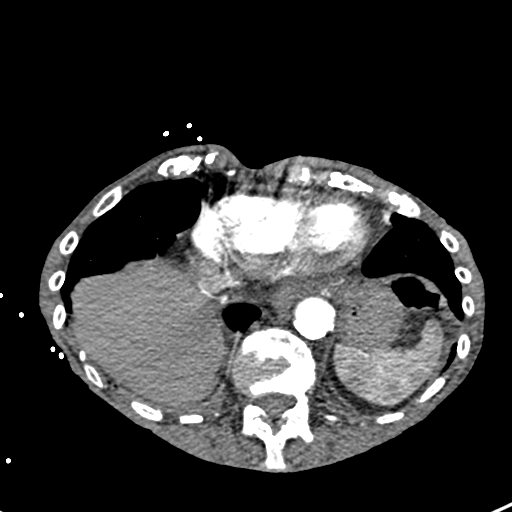
[im 105/345  lung]
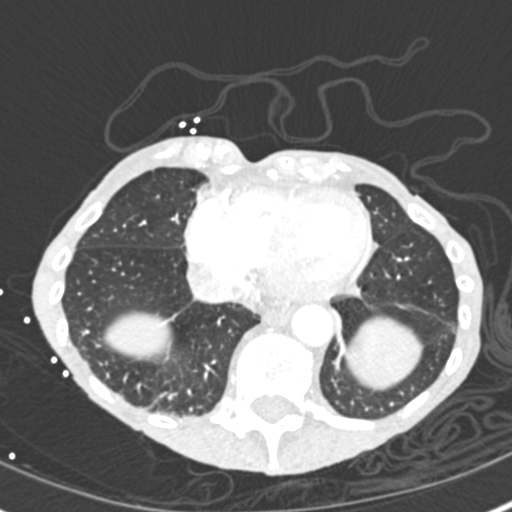
[im 135/345  soft-tissue]
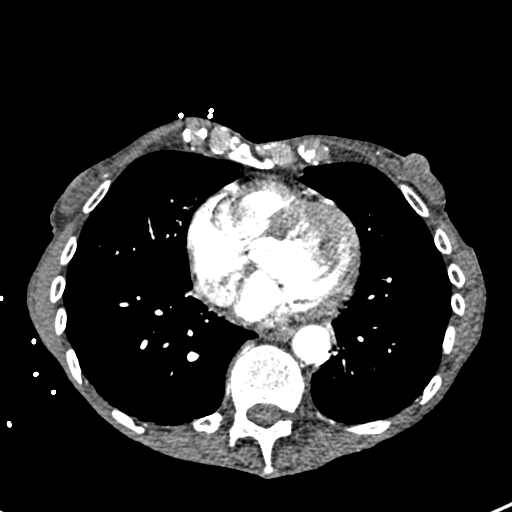
[im 150/345  lung]
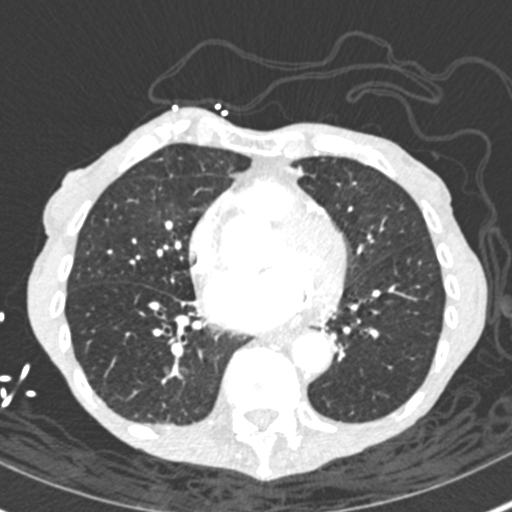
[im 180/345  soft-tissue]
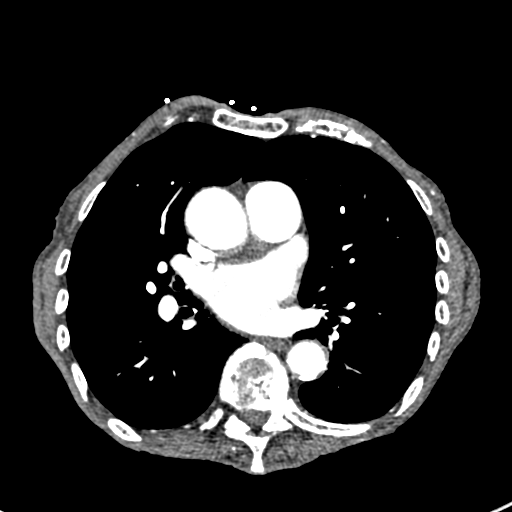
[im 195/345  lung]
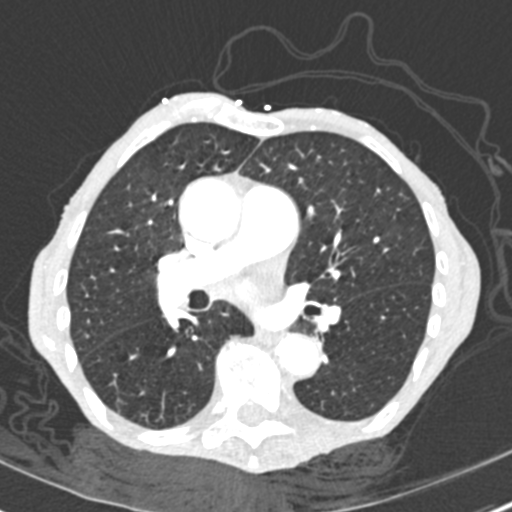
[im 210/345  soft-tissue]
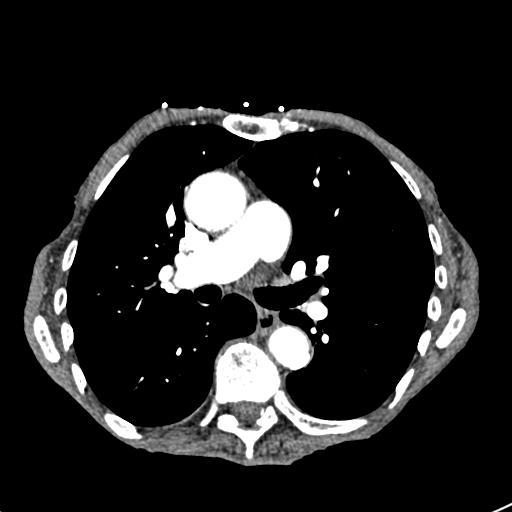
[im 240/345  lung]
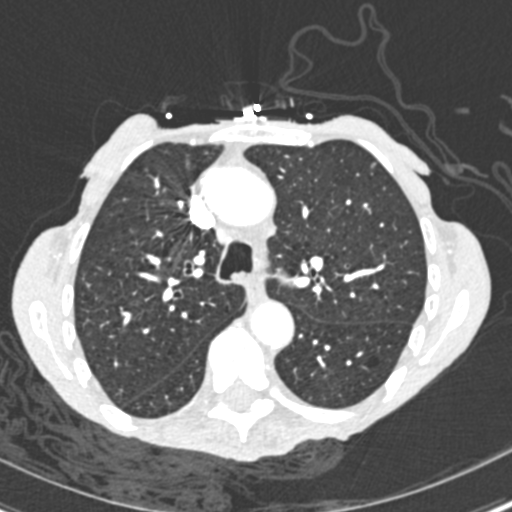
[im 255/345  soft-tissue]
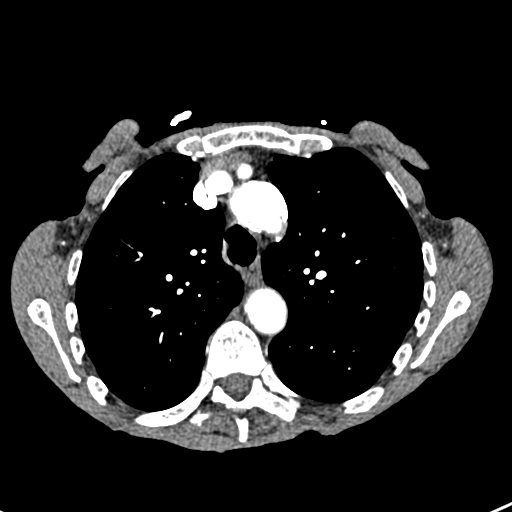
[im 285/345  lung]
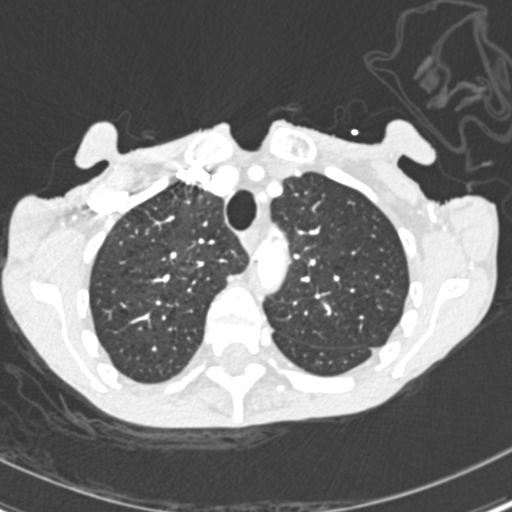
[im 300/345  soft-tissue]
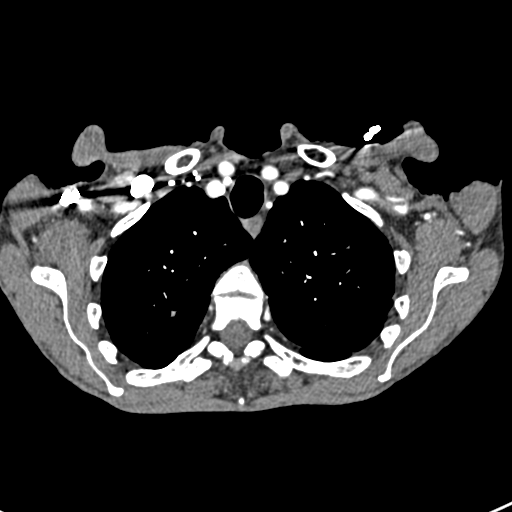
[im 330/345  lung]
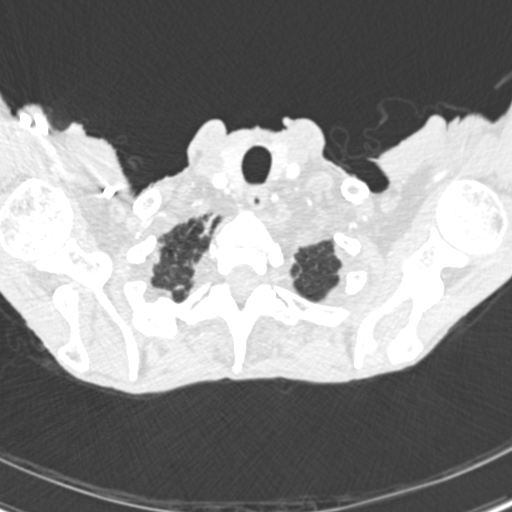

[Series 7: coronal mpr · coronal · 0.59mm/px · 3 of 110 slices shown]
[im 28/110  soft-tissue]
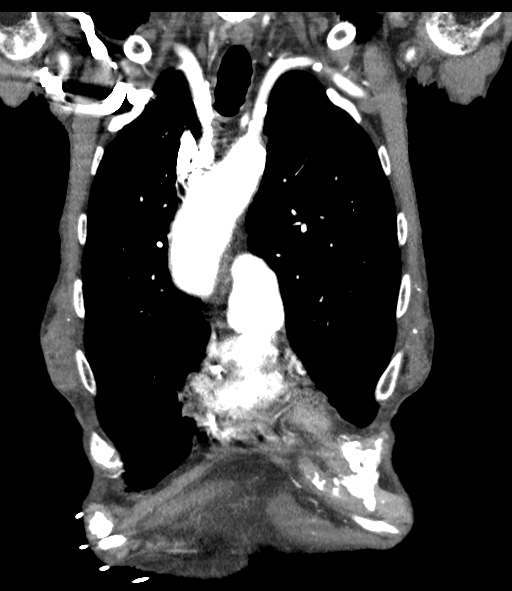
[im 55/110  soft-tissue]
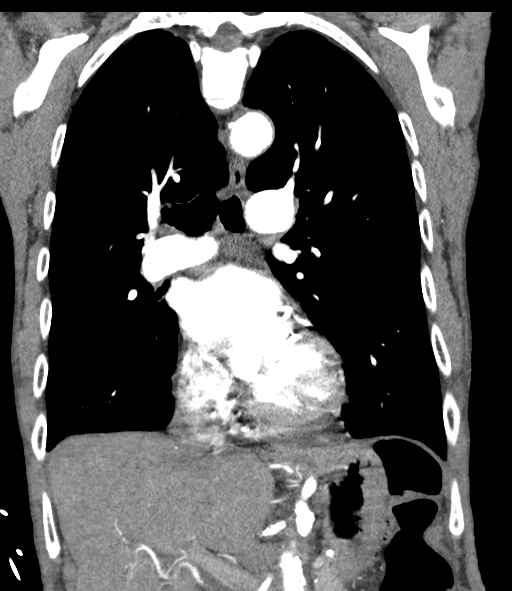
[im 82/110  soft-tissue]
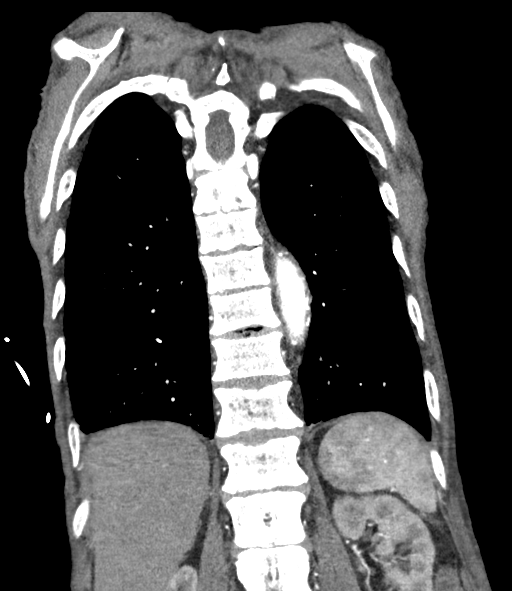

[18 of 46 positions shown; findings below may reference images not displayed]

FINDINGS: Cardiovascular: This is a technically satisfactory study. No
pulmonary emboli are identified. Thoracic aortic atherosclerotic
calcifications noted without aneurysm or definite dissection.
Cardiomegaly and coronary artery calcifications are present.

Mediastinum/Nodes: No mediastinal mass, enlarged lymph nodes or
pericardial effusion.

Lungs/Pleura: Mild centrilobular emphysema noted. There is no
evidence of airspace disease, consolidation, mass, suspicious
nodule, pleural effusion or pneumothorax. Minimal bibasilar and
right upper lobe scarring noted.

Upper Abdomen: No acute abnormality.

Musculoskeletal: No acute or suspicious abnormalities identified.

Review of the MIP images confirms the above findings.
IMPRESSION: No acute abnormality. No evidence of pulmonary emboli or thoracic
aortic aneurysm/definite dissection.

Cardiomegaly and coronary disease.

Mild centrilobular emphysema.

## 2017-08-15 IMAGING — DX DG HAND 2V*L*
2 series · 2 of 2 positions shown · non-contrast
Comparison: Right hand radiographs

CLINICAL DATA: Evaluation of chronic left and right hand pain.
Patient has several small raised areas of concerns on multiple areas
on both hands and her right thumb has a swollen and pus filled area
that had to be lanced post admittance. Patient was unable to remove
ring on right hand images and right thumb was bandaged during
imaging.

EXAM:
LEFT HAND - 2 VIEW

[hand ap]
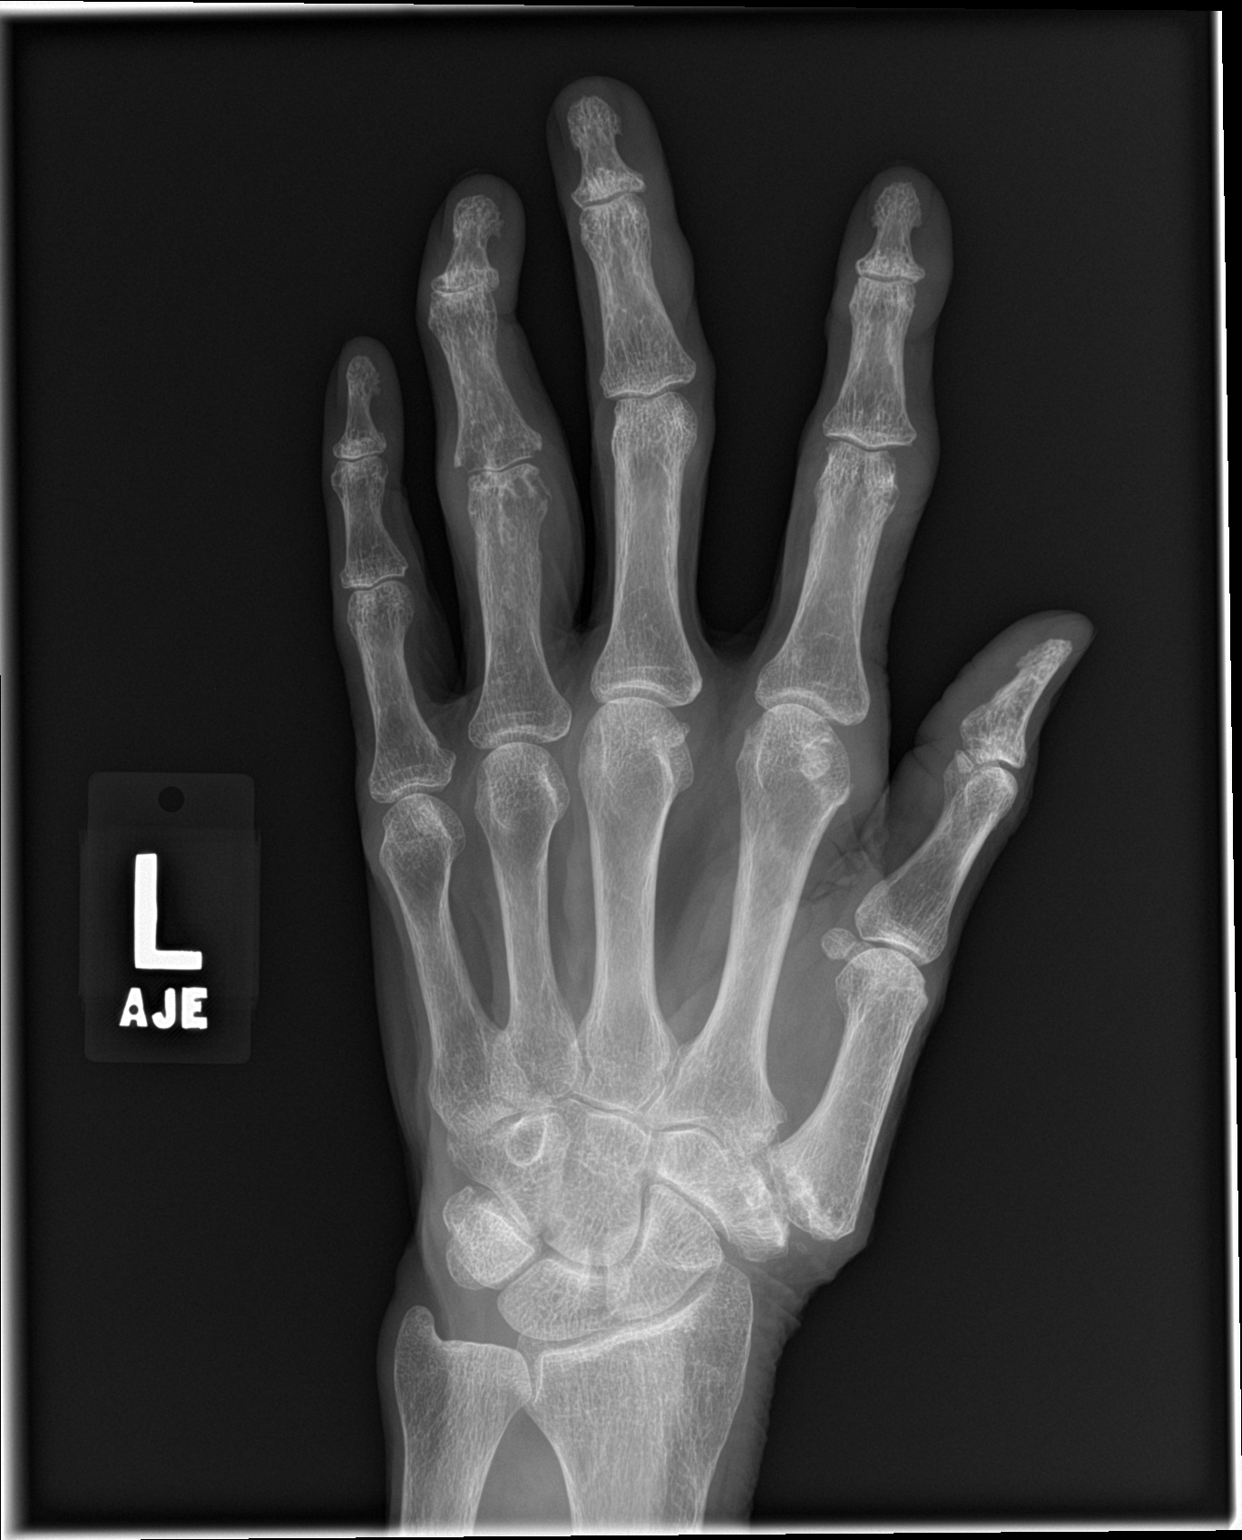

[hand lat]
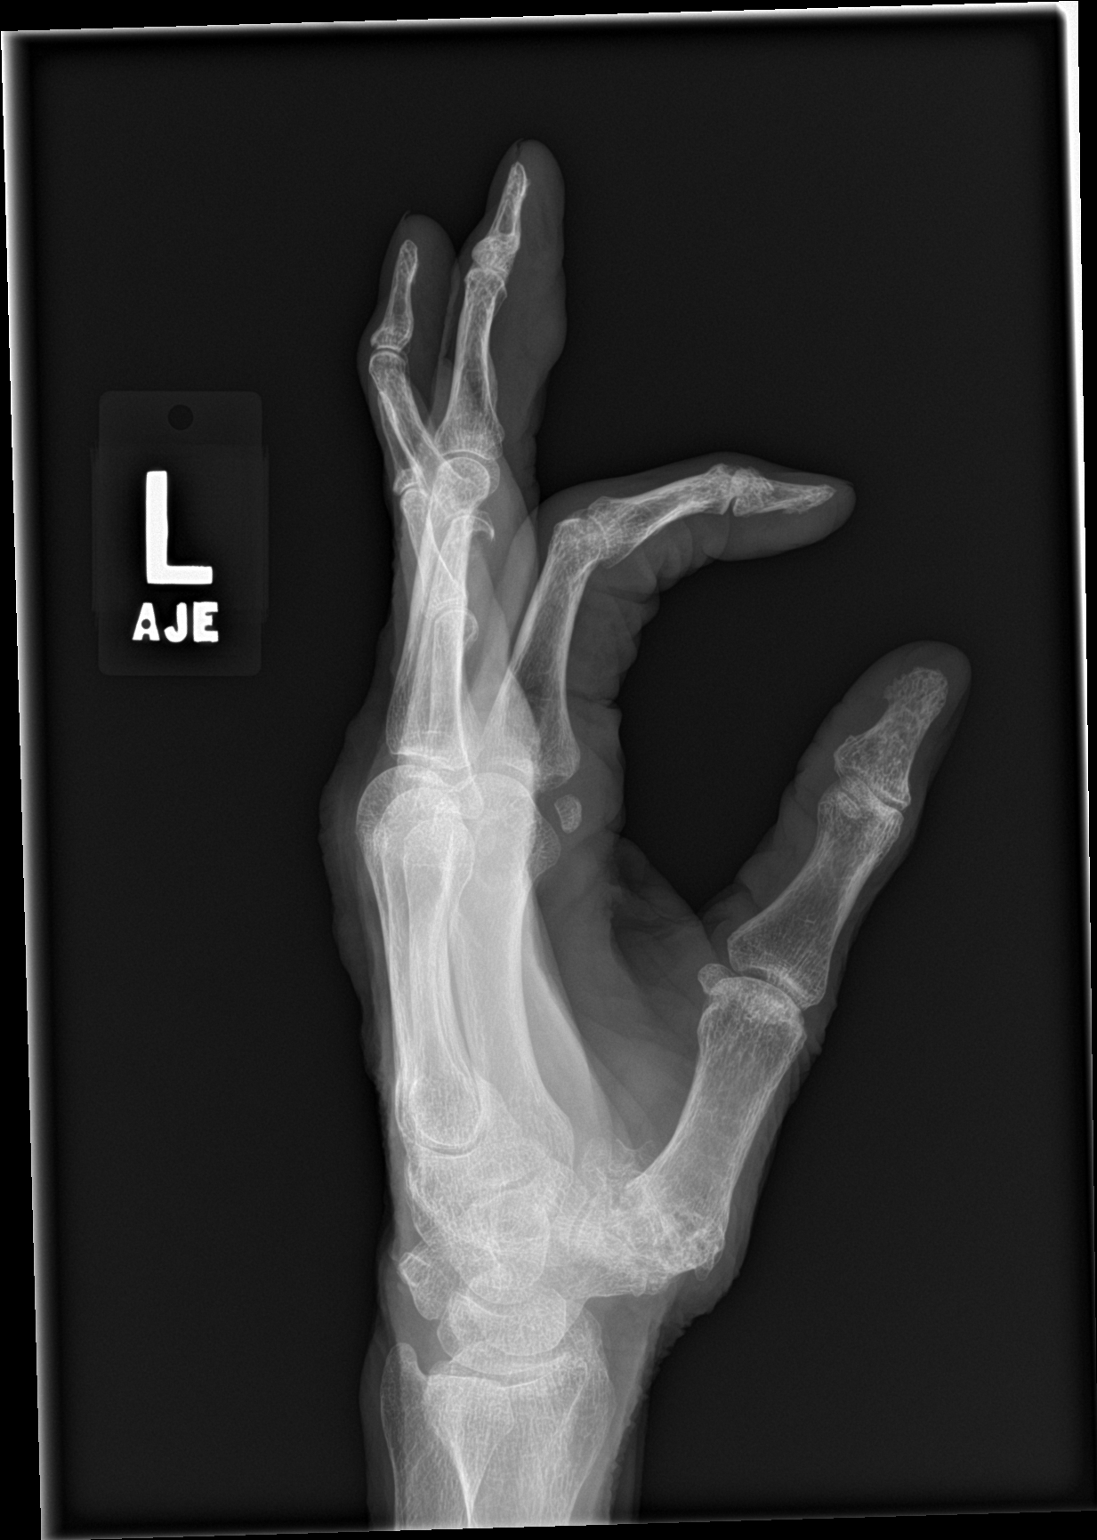

[2 of 2 positions shown; findings below may reference images not displayed]

FINDINGS: No fracture or dislocation. Next item there arthropathic changes
involving multiple joints, most prominently the PIP joint of the
fourth finger. There is asymmetric joint space narrowing with
subchondral a marginal erosions. There is a subtle marginal erosion
along the radial margin of the proximal phalanx at the PIP joint of
the index finger.

The joint space widening with subchondral cystic change and mild
sclerosis at the trapezium first metacarpal articulation.

Soft tissue swelling is noted at the PIP joints of the index and
ring fingers. There is also soft tissue swelling adjacent to the DIP
joint of the index finger and along the volar aspect of the middle
finger between the PIP and DIP joints. No soft tissue ossification
or calcification.
IMPRESSION: 1. No acute fracture or dislocation.
2. Arthropathic changes as described consistent with an erosive
arthropathy. The changes are more severe involving the right hand.
Consider gout in the differential diagnosis.

## 2017-08-15 IMAGING — DX DG HAND 2V*R*
2 series · 2 of 2 positions shown · non-contrast
Comparison: None.

CLINICAL DATA: Evaluation of chronic left and right hand pain.
Patient has several small raised areas of concerns on multiple areas
on both hands and her right thumb has a swollen and pus filled area
that had to be lanced post admittance. Patient was unable to remove
ring on right hand images and right thumb was bandaged during
imaging.

EXAM:
RIGHT HAND - 2 VIEW

[hand ap]
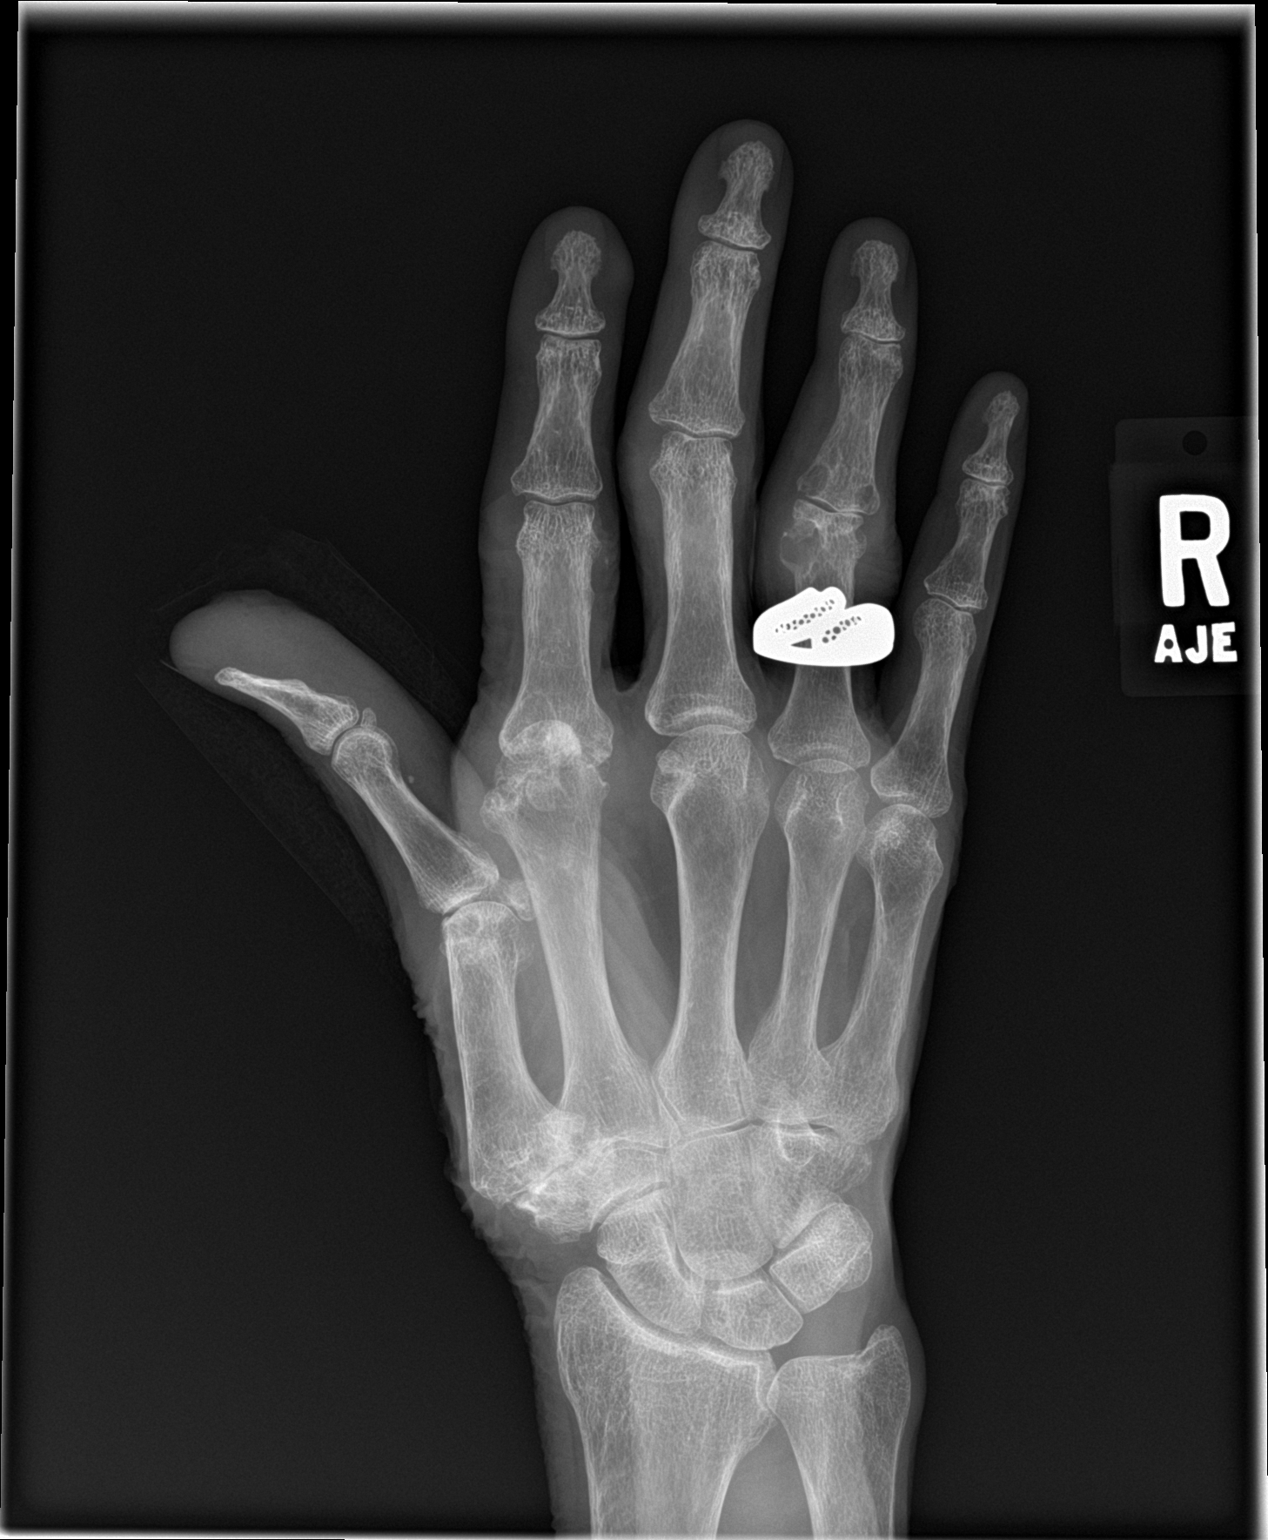

[hand lat]
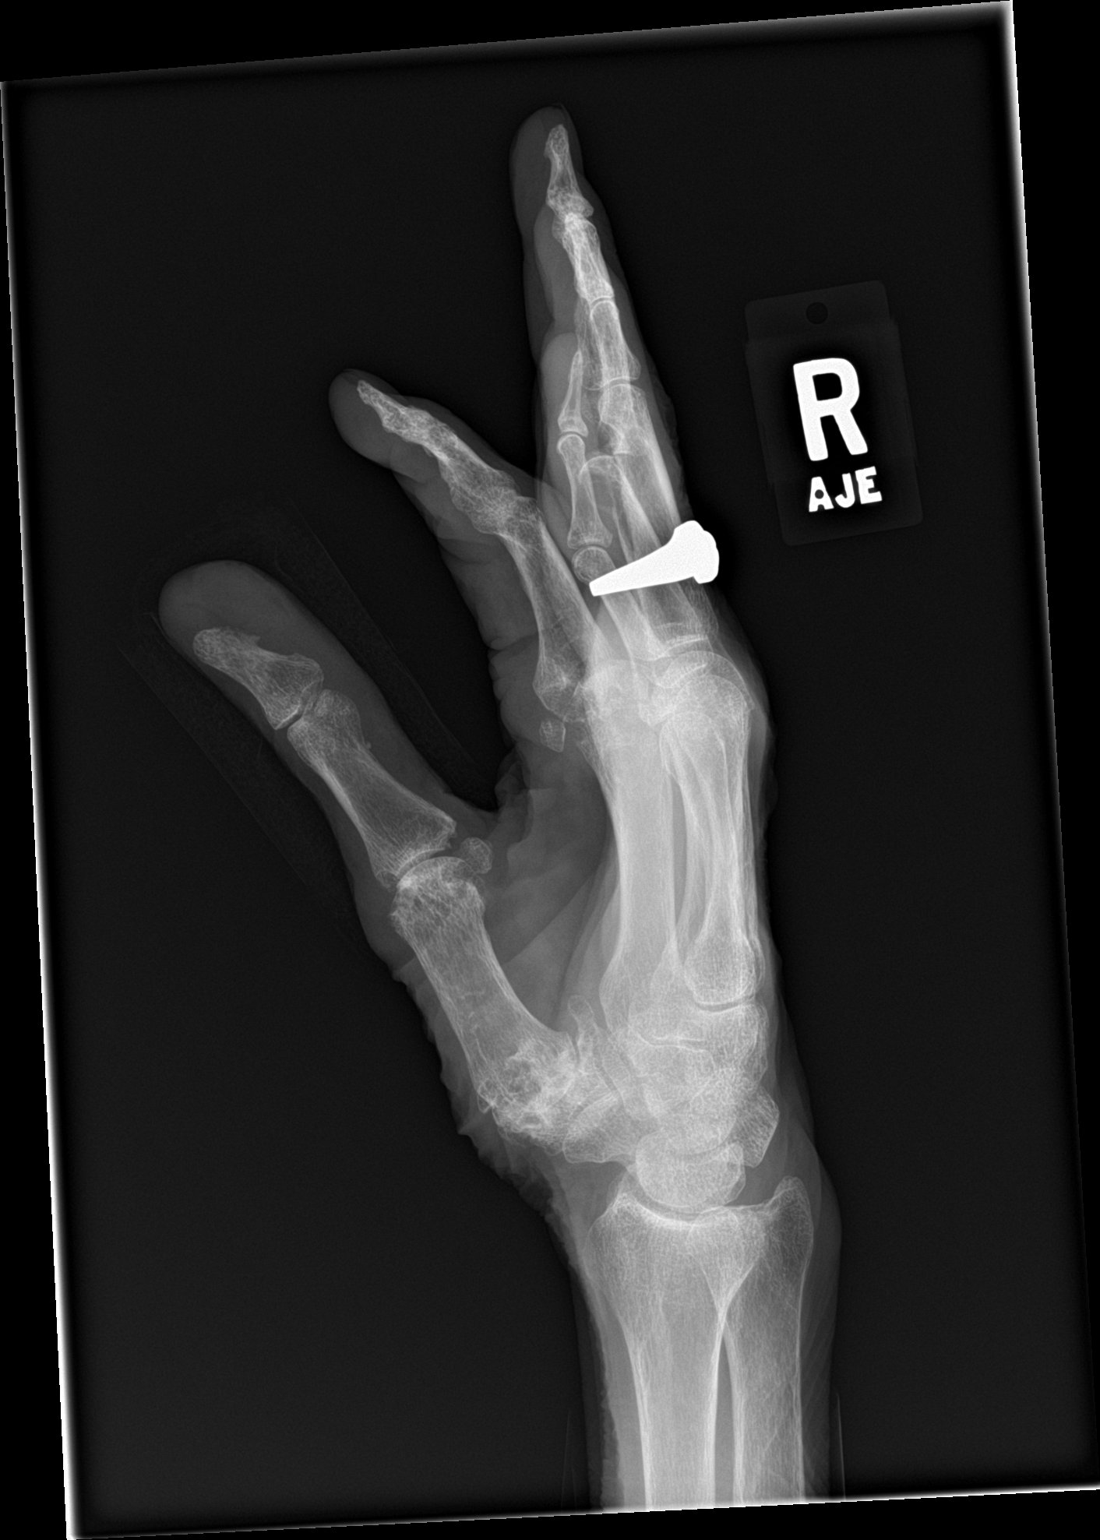

[2 of 2 positions shown; findings below may reference images not displayed]

FINDINGS: No acute fracture.

There arthropathic changes involving multiple joints. At the PIP
joint of the ring finger, there are multiple marginal erosions.
There is asymmetric joint space narrowing. Low

There is anterior subluxation of the proximal phalanx of the index
finger in relation to the second metacarpal head. Marginal erosions
are noted along the ulnar and volar aspect of the second metacarpal
head. There is a probable erosion at the base of the proximal
phalanx of the index finger.

Marginal erosions are noted at the base of the proximal phalanx of
thumb.

There is joint space narrowing, subchondral sclerosis and small
marginal osteophytes at the trapezium first metacarpal articulation.

Bones are demineralized.

Soft tissue swelling is evident adjacent to multiple joints, most
prominently the PIP joint of the fourth finger. There is soft tissue
swelling over the anterior margin of the distal thumb. No radiopaque
foreign body. No soft tissue ossification or calcification.
IMPRESSION: 1. No fracture or dislocation.
2. There changes of an erosive arthropathy. The pattern of marginal
erosions raises suspicion for gout. The second metacarpophalangeal
joint shows anterior subluxation as well as erosions.
3. Soft tissue swelling is seen associated with multiple joints.
There is also soft tissue swelling over the anterior aspect of
thumb.
4. Osteoarthritis at the first carpal metacarpal articulation.
